# Patient Record
Sex: Female | Born: 1980 | Race: Black or African American | Hispanic: No | Marital: Married | Smoking: Never smoker
Health system: Southern US, Community
[De-identification: ages and names within clinical notes are randomized; demographics above are authoritative.]

## PROBLEM LIST (undated history)

## (undated) ENCOUNTER — Inpatient Hospital Stay (HOSPITAL_COMMUNITY): Payer: Self-pay

## (undated) DIAGNOSIS — O139 Gestational [pregnancy-induced] hypertension without significant proteinuria, unspecified trimester: Secondary | ICD-10-CM

## (undated) DIAGNOSIS — Z8619 Personal history of other infectious and parasitic diseases: Secondary | ICD-10-CM

## (undated) DIAGNOSIS — R51 Headache: Secondary | ICD-10-CM

## (undated) DIAGNOSIS — Z86718 Personal history of other venous thrombosis and embolism: Secondary | ICD-10-CM

## (undated) DIAGNOSIS — K219 Gastro-esophageal reflux disease without esophagitis: Secondary | ICD-10-CM

## (undated) DIAGNOSIS — R519 Headache, unspecified: Secondary | ICD-10-CM

## (undated) DIAGNOSIS — E785 Hyperlipidemia, unspecified: Secondary | ICD-10-CM

## (undated) DIAGNOSIS — O09529 Supervision of elderly multigravida, unspecified trimester: Secondary | ICD-10-CM

## (undated) DIAGNOSIS — I10 Essential (primary) hypertension: Secondary | ICD-10-CM

## (undated) DIAGNOSIS — I2699 Other pulmonary embolism without acute cor pulmonale: Secondary | ICD-10-CM

## (undated) HISTORY — DX: Supervision of elderly multigravida, unspecified trimester: O09.529

## (undated) HISTORY — DX: Gastro-esophageal reflux disease without esophagitis: K21.9

## (undated) HISTORY — DX: Personal history of other infectious and parasitic diseases: Z86.19

## (undated) HISTORY — PX: CHOLECYSTECTOMY: SHX55

## (undated) HISTORY — DX: Headache, unspecified: R51.9

## (undated) HISTORY — DX: Headache: R51

---
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
## 2000-04-19 ENCOUNTER — Encounter: Payer: Self-pay | Admitting: *Deleted

## 2000-04-19 ENCOUNTER — Ambulatory Visit (HOSPITAL_COMMUNITY): Admission: RE | Admit: 2000-04-19 | Discharge: 2000-04-19 | Payer: Self-pay | Admitting: *Deleted

## 2000-05-30 ENCOUNTER — Inpatient Hospital Stay (HOSPITAL_COMMUNITY): Admission: AD | Admit: 2000-05-30 | Discharge: 2000-06-01 | Payer: Self-pay | Admitting: *Deleted

## 2005-10-11 ENCOUNTER — Inpatient Hospital Stay (HOSPITAL_COMMUNITY): Admission: AD | Admit: 2005-10-11 | Discharge: 2005-10-11 | Payer: Self-pay | Admitting: Obstetrics and Gynecology

## 2005-10-12 ENCOUNTER — Inpatient Hospital Stay (HOSPITAL_COMMUNITY): Admission: AD | Admit: 2005-10-12 | Discharge: 2005-10-12 | Payer: Self-pay | Admitting: Obstetrics and Gynecology

## 2005-10-18 ENCOUNTER — Inpatient Hospital Stay (HOSPITAL_COMMUNITY): Admission: AD | Admit: 2005-10-18 | Discharge: 2005-10-18 | Payer: Self-pay | Admitting: Gynecology

## 2005-11-03 ENCOUNTER — Inpatient Hospital Stay (HOSPITAL_COMMUNITY): Admission: AD | Admit: 2005-11-03 | Discharge: 2005-11-03 | Payer: Self-pay | Admitting: Obstetrics and Gynecology

## 2005-11-14 ENCOUNTER — Inpatient Hospital Stay (HOSPITAL_COMMUNITY): Admission: AD | Admit: 2005-11-14 | Discharge: 2005-11-15 | Payer: Self-pay | Admitting: Obstetrics and Gynecology

## 2005-11-30 ENCOUNTER — Inpatient Hospital Stay (HOSPITAL_COMMUNITY): Admission: AD | Admit: 2005-11-30 | Discharge: 2005-12-03 | Payer: Self-pay | Admitting: Obstetrics and Gynecology

## 2007-03-05 ENCOUNTER — Inpatient Hospital Stay (HOSPITAL_COMMUNITY): Admission: AD | Admit: 2007-03-05 | Discharge: 2007-03-06 | Payer: Self-pay | Admitting: Obstetrics & Gynecology

## 2007-03-05 ENCOUNTER — Encounter: Payer: Self-pay | Admitting: Obstetrics & Gynecology

## 2007-03-08 ENCOUNTER — Inpatient Hospital Stay (HOSPITAL_COMMUNITY): Admission: AD | Admit: 2007-03-08 | Discharge: 2007-03-08 | Payer: Self-pay | Admitting: Obstetrics & Gynecology

## 2007-07-03 ENCOUNTER — Emergency Department (HOSPITAL_COMMUNITY): Admission: EM | Admit: 2007-07-03 | Discharge: 2007-07-03 | Payer: Self-pay | Admitting: Emergency Medicine

## 2007-12-04 ENCOUNTER — Ambulatory Visit (HOSPITAL_COMMUNITY): Admission: RE | Admit: 2007-12-04 | Discharge: 2007-12-04 | Payer: Self-pay | Admitting: Obstetrics and Gynecology

## 2008-01-05 ENCOUNTER — Inpatient Hospital Stay (HOSPITAL_COMMUNITY): Admission: AD | Admit: 2008-01-05 | Discharge: 2008-01-07 | Payer: Self-pay | Admitting: Obstetrics and Gynecology

## 2008-03-15 ENCOUNTER — Emergency Department (HOSPITAL_COMMUNITY): Admission: EM | Admit: 2008-03-15 | Discharge: 2008-03-16 | Payer: Self-pay | Admitting: Emergency Medicine

## 2008-03-17 ENCOUNTER — Emergency Department (HOSPITAL_COMMUNITY): Admission: EM | Admit: 2008-03-17 | Discharge: 2008-03-17 | Payer: Self-pay | Admitting: Family Medicine

## 2008-05-20 ENCOUNTER — Emergency Department (HOSPITAL_COMMUNITY): Admission: EM | Admit: 2008-05-20 | Discharge: 2008-05-20 | Payer: Self-pay | Admitting: Family Medicine

## 2008-07-16 ENCOUNTER — Emergency Department (HOSPITAL_COMMUNITY): Admission: EM | Admit: 2008-07-16 | Discharge: 2008-07-17 | Payer: Self-pay | Admitting: Emergency Medicine

## 2008-10-08 ENCOUNTER — Emergency Department (HOSPITAL_COMMUNITY): Admission: EM | Admit: 2008-10-08 | Discharge: 2008-10-08 | Payer: Self-pay | Admitting: Family Medicine

## 2009-01-22 ENCOUNTER — Emergency Department (HOSPITAL_COMMUNITY): Admission: EM | Admit: 2009-01-22 | Discharge: 2009-01-22 | Payer: Self-pay | Admitting: Emergency Medicine

## 2009-05-15 ENCOUNTER — Emergency Department (HOSPITAL_COMMUNITY): Admission: EM | Admit: 2009-05-15 | Discharge: 2009-05-15 | Payer: Self-pay | Admitting: Emergency Medicine

## 2009-06-04 ENCOUNTER — Emergency Department (HOSPITAL_COMMUNITY): Admission: EM | Admit: 2009-06-04 | Discharge: 2009-06-04 | Payer: Self-pay | Admitting: Emergency Medicine

## 2009-07-09 ENCOUNTER — Emergency Department (HOSPITAL_COMMUNITY): Admission: EM | Admit: 2009-07-09 | Discharge: 2009-07-09 | Payer: Self-pay | Admitting: Emergency Medicine

## 2009-11-22 ENCOUNTER — Emergency Department (HOSPITAL_COMMUNITY): Admission: EM | Admit: 2009-11-22 | Discharge: 2009-11-22 | Payer: Self-pay | Admitting: Emergency Medicine

## 2010-05-11 LAB — COMPREHENSIVE METABOLIC PANEL
Albumin: 3.3 g/dL — ABNORMAL LOW (ref 3.5–5.2)
Alkaline Phosphatase: 80 U/L (ref 39–117)
BUN: 5 mg/dL — ABNORMAL LOW (ref 6–23)
CO2: 27 mEq/L (ref 19–32)
Chloride: 107 mEq/L (ref 96–112)
Creatinine, Ser: 0.73 mg/dL (ref 0.4–1.2)
GFR calc non Af Amer: >60 mL/min (ref >60)
Potassium: 4 mEq/L (ref 3.5–5.1)
Total Bilirubin: 0.3 mg/dL (ref 0.3–1.2)

## 2010-05-11 LAB — CBC
HCT: 37.8 % (ref 36.0–46.0)
Hemoglobin: 12.7 g/dL (ref 12.0–15.0)
MCH: 29.6 pg (ref 26.0–34.0)
MCHC: 33.6 g/dL (ref 30.0–36.0)
MCV: 88.1 fL (ref 78.0–100.0)
Platelets: 245 10*3/uL (ref 150–400)
RBC: 4.29 MIL/uL (ref 3.87–5.11)
RDW: 13.7 % (ref 11.5–15.5)
WBC: 7.1 10*3/uL (ref 4.0–10.5)

## 2010-05-11 LAB — DIFFERENTIAL
Basophils Absolute: 0 10*3/uL (ref 0.0–0.1)
Basophils Relative: 1 % (ref 0–1)
Eosinophils Relative: 3 % (ref 0–5)
Lymphocytes Relative: 37 % (ref 12–46)
Monocytes Absolute: 0.6 10*3/uL (ref 0.1–1.0)
Neutro Abs: 3.6 10*3/uL (ref 1.7–7.7)

## 2010-05-11 LAB — URINALYSIS, ROUTINE W REFLEX MICROSCOPIC
Bilirubin Urine: NEGATIVE
Glucose, UA: NEGATIVE mg/dL
Hgb urine dipstick: NEGATIVE
Specific Gravity, Urine: 1.015 (ref 1.005–1.030)
Urobilinogen, UA: 0.2 mg/dL (ref 0.0–1.0)
pH: 6 (ref 5.0–8.0)

## 2010-05-11 LAB — LIPASE, BLOOD: Lipase: 22 U/L (ref 11–59)

## 2010-05-16 LAB — CBC
MCHC: 34.8 g/dL (ref 30.0–36.0)
MCV: 89.8 fL (ref 78.0–100.0)
Platelets: 192 10*3/uL (ref 150–400)
RBC: 3.85 MIL/uL — ABNORMAL LOW (ref 3.87–5.11)
WBC: 7.5 10*3/uL (ref 4.0–10.5)

## 2010-05-16 LAB — COMPREHENSIVE METABOLIC PANEL
ALT: 14 U/L (ref 0–35)
AST: 35 U/L (ref 0–37)
Albumin: 3.3 g/dL — ABNORMAL LOW (ref 3.5–5.2)
Calcium: 8.7 mg/dL (ref 8.4–10.5)
Creatinine, Ser: 0.66 mg/dL (ref 0.4–1.2)
GFR calc Af Amer: >60 mL/min (ref >60)
Sodium: 137 mEq/L (ref 135–145)

## 2010-05-16 LAB — DIFFERENTIAL
Eosinophils Absolute: 0 10*3/uL (ref 0.0–0.7)
Eosinophils Relative: 1 % (ref 0–5)
Lymphocytes Relative: 27 % (ref 12–46)
Lymphs Abs: 2 10*3/uL (ref 0.7–4.0)
Monocytes Absolute: 0.5 10*3/uL (ref 0.1–1.0)
Monocytes Relative: 6 % (ref 3–12)

## 2010-05-17 LAB — DIFFERENTIAL
Basophils Absolute: 0.1 10*3/uL (ref 0.0–0.1)
Basophils Relative: 1 % (ref 0–1)
Eosinophils Absolute: 0.2 10*3/uL (ref 0.0–0.7)
Lymphocytes Relative: 45 % (ref 12–46)
Monocytes Relative: 8 % (ref 3–12)
Neutrophils Relative %: 44 % (ref 43–77)

## 2010-05-17 LAB — COMPREHENSIVE METABOLIC PANEL
Alkaline Phosphatase: 85 U/L (ref 39–117)
BUN: 11 mg/dL (ref 6–23)
Calcium: 8.3 mg/dL — ABNORMAL LOW (ref 8.4–10.5)
Creatinine, Ser: 0.67 mg/dL (ref 0.4–1.2)
Glucose, Bld: 99 mg/dL (ref 70–99)
Potassium: 4.1 mEq/L (ref 3.5–5.1)
Total Protein: 7 g/dL (ref 6.0–8.3)

## 2010-05-17 LAB — CBC
HCT: 36.2 % (ref 36.0–46.0)
Hemoglobin: 11.9 g/dL — ABNORMAL LOW (ref 12.0–15.0)
MCHC: 33 g/dL (ref 30.0–36.0)
MCV: 90.3 fL (ref 78.0–100.0)
Platelets: 199 10*3/uL (ref 150–400)
RDW: 14.5 % (ref 11.5–15.5)

## 2010-05-17 LAB — LIPASE, BLOOD: Lipase: 25 U/L (ref 11–59)

## 2010-05-22 LAB — CBC
Hemoglobin: 12.1 g/dL (ref 12.0–15.0)
Platelets: 253 10*3/uL (ref 150–400)
RDW: 14.7 % (ref 11.5–15.5)
WBC: 8.8 10*3/uL (ref 4.0–10.5)

## 2010-05-22 LAB — COMPREHENSIVE METABOLIC PANEL
ALT: 12 U/L (ref 0–35)
Albumin: 3.3 g/dL — ABNORMAL LOW (ref 3.5–5.2)
Alkaline Phosphatase: 80 U/L (ref 39–117)
Potassium: 3.3 mEq/L — ABNORMAL LOW (ref 3.5–5.1)
Sodium: 136 mEq/L (ref 135–145)
Total Protein: 6.9 g/dL (ref 6.0–8.3)

## 2010-05-22 LAB — DIFFERENTIAL
Basophils Relative: 1 % (ref 0–1)
Eosinophils Absolute: 0.1 10*3/uL (ref 0.0–0.7)
Eosinophils Relative: 2 % (ref 0–5)
Monocytes Absolute: 1 10*3/uL (ref 0.1–1.0)
Monocytes Relative: 11 % (ref 3–12)

## 2010-05-31 LAB — DIFFERENTIAL
Eosinophils Relative: 2 % (ref 0–5)
Lymphocytes Relative: 56 % — ABNORMAL HIGH (ref 12–46)
Lymphs Abs: 5.2 10*3/uL — ABNORMAL HIGH (ref 0.7–4.0)

## 2010-05-31 LAB — URINE MICROSCOPIC-ADD ON

## 2010-05-31 LAB — COMPREHENSIVE METABOLIC PANEL
AST: 27 U/L (ref 0–37)
Albumin: 3.5 g/dL (ref 3.5–5.2)
CO2: 22 mEq/L (ref 19–32)
Calcium: 8.3 mg/dL — ABNORMAL LOW (ref 8.4–10.5)
Creatinine, Ser: 0.62 mg/dL (ref 0.4–1.2)
GFR calc Af Amer: >60 mL/min (ref >60)
GFR calc non Af Amer: >60 mL/min (ref >60)

## 2010-05-31 LAB — URINALYSIS, ROUTINE W REFLEX MICROSCOPIC
Hgb urine dipstick: NEGATIVE
Ketones, ur: NEGATIVE mg/dL
Leukocytes, UA: NEGATIVE
Protein, ur: 100 mg/dL — AB
Urobilinogen, UA: 1 mg/dL (ref 0.0–1.0)

## 2010-05-31 LAB — CBC
MCHC: 33.8 g/dL (ref 30.0–36.0)
MCV: 88.4 fL (ref 78.0–100.0)
Platelets: 235 10*3/uL (ref 150–400)

## 2010-06-12 LAB — COMPREHENSIVE METABOLIC PANEL
ALT: 15 U/L (ref 0–35)
AST: 21 U/L (ref 0–37)
Albumin: 3.4 g/dL — ABNORMAL LOW (ref 3.5–5.2)
Alkaline Phosphatase: 92 U/L (ref 39–117)
Calcium: 9.1 mg/dL (ref 8.4–10.5)
GFR calc Af Amer: >60 mL/min (ref >60)
Glucose, Bld: 94 mg/dL (ref 70–99)
Potassium: 4.1 mEq/L (ref 3.5–5.1)
Sodium: 140 mEq/L (ref 135–145)
Total Protein: 7.1 g/dL (ref 6.0–8.3)

## 2010-06-12 LAB — CBC
Hemoglobin: 12.6 g/dL (ref 12.0–15.0)
MCHC: 33.1 g/dL (ref 30.0–36.0)
Platelets: 263 10*3/uL (ref 150–400)
RDW: 15.2 % (ref 11.5–15.5)

## 2010-06-12 LAB — POCT PREGNANCY, URINE: Preg Test, Ur: NEGATIVE

## 2010-06-12 LAB — URINE MICROSCOPIC-ADD ON

## 2010-06-12 LAB — DIFFERENTIAL
Basophils Relative: 0 % (ref 0–1)
Eosinophils Absolute: 0.1 10*3/uL (ref 0.0–0.7)
Eosinophils Relative: 1 % (ref 0–5)
Lymphs Abs: 2.9 10*3/uL (ref 0.7–4.0)
Monocytes Absolute: 0.7 10*3/uL (ref 0.1–1.0)
Monocytes Relative: 9 % (ref 3–12)

## 2010-06-12 LAB — URINALYSIS, ROUTINE W REFLEX MICROSCOPIC
Bilirubin Urine: NEGATIVE
Nitrite: NEGATIVE
Specific Gravity, Urine: 1.023 (ref 1.005–1.030)
pH: 6.5 (ref 5.0–8.0)

## 2010-07-11 NOTE — Discharge Summary (Signed)
NAME:  Mindy Wade, Mindy Wade NO.:  [id number]   MEDICAL RECORD NO.:  [id number]          PATIENT TYPE:  INP   LOCATION:  9144                          FACILITY:  [facility]   PHYSICIAN:  Huel Cote, M.D. DATE OF BIRTH:  08-03-1980   DATE OF ADMISSION:  01/05/2008  DATE OF DISCHARGE:  01/07/2008                               DISCHARGE SUMMARY   DISCHARGE DIAGNOSES:  1. Term pregnancy at 38 plus weeks delivered.  2. Status post normal spontaneous vaginal delivery.   DISCHARGE MEDICATIONS:  1. Motrin 600 mg p.o. every 6 hours.  2. Percocet 1-2 tablets p.o. every 4 hours p.r.n.   DISCHARGE FOLLOWUP:  The patient is to follow up in the office in 6  weeks for her routine postpartum exam.   HOSPITAL COURSE:  The patient is a 30 year old G4, P 3-0-0-3, who came  in at 68 plus weeks' gestation to maternity admissions in labor with  cervix 4+ centimeters dilated.  She came to L&D and approximately 30  minutes later was found to be 8 cm dilated and received her epidural.  Her prenatal care had been uncomplicated except for a positive anti-e  antibody which had been too weak to titer.  Followup on admission was  actually negative for an anti-e antibody on her blood work here.  She  was also group B Strep positive, but was sensitive to clindamycin.   Prenatal labs were as follows:  A positive antibody negative except for  the anti-e which was too weak to titer during pregnancy, sickle normal,  RPR nonreactive, rubella immune, hepatitis B surface antigen negative,  HIV negative, GC negative, chlamydia negative, group B Strep negative, 1-  hour Glucola 86, and she was too late to have any of the genetic screens  performed.   PAST OBSTETRICAL HISTORY:  In 1999, she had a 5-pound vaginal delivery.  In 2002, she had a 6-pound vaginal delivery.  In 2007, she had an 8  pound 4 ounce vaginal delivery.   PAST GYN HISTORY:  None.   PAST SURGICAL HISTORY:  None.   PAST MEDICAL  HISTORY:  None.   ALLERGIES:  PENICILLIN.   MEDICATIONS:  Prenatal vitamins and Flagyl.   On admission, she was afebrile with stable vital signs.  Fetal heart  rate was reactive.  Cervix was complete, and not very shortly after her  epidural, she had rupture of membranes performed with clear fluid noted.  She had received her clindamycin for her group B Strep prophylaxis  already.  She quickly thereafter reached complete dilation and pushed  great with a normal spontaneous vaginal delivery of a vigorous female  infant over an intact perineum.  Apgars were 9 and 9, weight was 7  pounds even.  Placenta delivered spontaneously.  She was then admitted  for routine  postpartum care.  She did very well.  Her postpartum hemoglobin was  10.1.  By postpartum day #2, she was feeling well.  Her pain was  minimal, and she was felt stable for discharge home.  She was given  instructions on pelvic rest and following up in the office in 6  weeks.      Huel Cote, M.D.  Electronically Signed     KR/MEDQ  D:  01/07/2008  T:  01/07/2008  Job:  [id number]

## 2010-07-14 NOTE — Discharge Summary (Signed)
NAME:  Mindy Wade, Mindy Wade NO.:  [id number]   MEDICAL RECORD NO.:  [id number]          PATIENT TYPE:  INP   LOCATION:  9124                          [facility]ACILITY:  [facility]   PHYSICIAN:  Malachi Pro. Ambrose Mantle, M.D. DATE OF BIRTH:  Jun 22, 1980   DATE OF ADMISSION:  12/02/2005  DATE OF DISCHARGE:  12/03/2005                                 DISCHARGE SUMMARY   A 30 year old black single female, para 2-0-2, gravida 3, EDC 12/08/2005 by  ultrasound, admitted for induction and given advanced cervical dilatation.  Blood group and type A+, negative antibody, sickle cell negative, RPR  nonreactive, rubella immune, hepatitis B surface antigen negative, HIV  negative, GC and chlamydia negative, 1-hour Glucola 126, Group B strep  negative.  Vaginal ultrasound on May 14, 2005, crown-rump length 3.37 cm,  10 weeks 2 days, [facility] December 08, 2005.  The patient did not have second  trimester screening.  Prenatal care was complicated by poor attendance and  multiple no shows.  On October 11, 2005, the cervix was loose fingertip.  Since November 01, 2005, the cervix has been 3-4 cm, and on the exam in the  office on the day of admission the cervix was 4 cm.   PAST MEDICAL HISTORY:  ALLERGIES TO PENICILLIN CAUSE SWELLING.   ILLNESSES:  Usual childhood diseases, Chlamydia treated in 1999, no  operations.  The patient's mother has had high blood pressure and a stroke.   FAMILY HISTORY:  Maternal grandmother with breast cancer.   SOCIAL HISTORY:  Alcohol, tobacco and drugs:  None.   OBSTETRICAL HISTORY:  In November 1999 a 5-pound female, 38 weeks vaginally.  In April of 2002, a 6-pound female, 38 weeks vaginally.   EXAMINATION:  VITAL SIGNS:  On admission were normal.  HEART AND LUNGS:  Normal.  ABDOMEN:  Soft.  The fundal height was 38 cm on November 23, 2005.  Fetal  heart tones were normal.  Cervix 4 cm, 70% vertex and a -2.  The patient was  kept in the maternity admission for a long time.   Finally, she did receive a  bed.  She did have spontaneous rupture of membranes with clear fluid while  in the maternity admission unit.  She was having rare contractions, and the  cervix was 4-5 cm in spite of the fact she had had spontaneous rupture of  membranes.  An amniotomy was done because there was a forebag.  After  amniotomy, the patient immediately developed a strong labor pattern and  progressed to 7 cm dilatation.  She received an epidural, progressed to full  dilatation and slowly brought the vertex to the perineum and delivered  spontaneously LOA over an intact perineum by Dr. Ambrose Mantle a living female infant  8 pounds 4 ounces without Apgars of 9 at 1 and 9 at 5 minutes.  Placenta was  intact.  The uterus was normal.  There were no lacerations.  Blood loss  about 400 cc.  Postpartum, the patient did well and was discharged on the  second postpartum day.  Initial hemoglobin 11.0, hematocrit 31.9, white  count 8,800, platelet count 224,000.  Follow-up hemoglobin 10.7.  Urinalysis  was negative.  RPR nonreactive.   FINAL DIAGNOSIS:  Intrauterine pregnancy at 39 weeks delivered left occiput  anterior.   OPERATION:  Spontaneous delivery left occiput anterior.   FINAL CONDITION:  Improved.   INSTRUCTIONS:  Include our regular discharge instruction booklet, Percocet  5/325 24 tablets 2 every 4-6 hours as needed for pain is given at discharge,  and the patient is advised to return to the office in 6 weeks for follow-up  examination.      Malachi Pro. Ambrose Mantle, M.D.  Electronically Signed     TFH/MEDQ  D:  12/03/2005  T:  12/03/2005  Job:  [id number]

## 2010-09-25 ENCOUNTER — Emergency Department (HOSPITAL_COMMUNITY): Payer: Self-pay

## 2010-09-25 ENCOUNTER — Emergency Department (HOSPITAL_COMMUNITY)
Admission: EM | Admit: 2010-09-25 | Discharge: 2010-09-25 | Disposition: A | Discharge status: Home / Self Care | Payer: Self-pay | Attending: Emergency Medicine | Admitting: Emergency Medicine

## 2010-09-25 DIAGNOSIS — F3289 Other specified depressive episodes: Secondary | ICD-10-CM | POA: Insufficient documentation

## 2010-09-25 DIAGNOSIS — R079 Chest pain, unspecified: Secondary | ICD-10-CM | POA: Insufficient documentation

## 2010-09-25 DIAGNOSIS — F329 Major depressive disorder, single episode, unspecified: Secondary | ICD-10-CM | POA: Insufficient documentation

## 2010-09-25 LAB — DIFFERENTIAL
Basophils Relative: 1 % (ref 0–1)
Eosinophils Absolute: 0.1 10*3/uL (ref 0.0–0.7)
Lymphs Abs: 2.5 10*3/uL (ref 0.7–4.0)
Monocytes Absolute: 0.5 10*3/uL (ref 0.1–1.0)
Monocytes Relative: 9 % (ref 3–12)
Neutrophils Relative %: 45 % (ref 43–77)

## 2010-09-25 LAB — COMPREHENSIVE METABOLIC PANEL
ALT: 14 U/L (ref 0–35)
AST: 17 U/L (ref 0–37)
CO2: 26 mEq/L (ref 19–32)
Chloride: 102 mEq/L (ref 96–112)
GFR calc Af Amer: >60 mL/min (ref >60)
GFR calc non Af Amer: >60 mL/min (ref >60)
Glucose, Bld: 88 mg/dL (ref 70–99)
Sodium: 137 mEq/L (ref 135–145)
Total Bilirubin: 0.1 mg/dL — ABNORMAL LOW (ref 0.3–1.2)

## 2010-09-25 LAB — TROPONIN I: Troponin I: <0.3 ng/mL (ref <0.30)

## 2010-09-25 LAB — CBC
MCH: 30.6 pg (ref 26.0–34.0)
MCHC: 34.4 g/dL (ref 30.0–36.0)
MCV: 89 fL (ref 78.0–100.0)
Platelets: 286 10*3/uL (ref 150–400)
RBC: 4.47 MIL/uL (ref 3.87–5.11)

## 2010-09-25 LAB — CK TOTAL AND CKMB (NOT AT ARMC): Total CK: 132 U/L (ref 7–177)

## 2010-11-15 LAB — WET PREP, GENITAL: Clue Cells Wet Prep HPF POC: NONE SEEN

## 2010-11-15 LAB — HCG, QUANTITATIVE, PREGNANCY: hCG, Beta Chain, Quant, S: 324 — ABNORMAL HIGH

## 2010-11-15 LAB — GC/CHLAMYDIA PROBE AMP, GENITAL
Chlamydia, DNA Probe: NEGATIVE
GC Probe Amp, Genital: NEGATIVE

## 2010-11-28 LAB — TYPE AND SCREEN
ABO/RH(D): A POS
Antibody Screen: POSITIVE
PT AG Type: NEGATIVE

## 2010-11-28 LAB — CBC
Hemoglobin: 10.1 — ABNORMAL LOW
MCHC: 33.6
Platelets: 214
RBC: 3.39 — ABNORMAL LOW
RDW: 14.1
WBC: 7.2
WBC: 8.8

## 2010-11-28 LAB — RPR: RPR Ser Ql: NONREACTIVE

## 2011-05-04 ENCOUNTER — Ambulatory Visit (INDEPENDENT_AMBULATORY_CARE_PROVIDER_SITE_OTHER): Payer: Self-pay | Admitting: General Surgery

## 2011-05-15 ENCOUNTER — Encounter (INDEPENDENT_AMBULATORY_CARE_PROVIDER_SITE_OTHER): Payer: Self-pay | Admitting: General Surgery

## 2012-03-27 ENCOUNTER — Encounter (HOSPITAL_COMMUNITY): Payer: Self-pay | Admitting: *Deleted

## 2012-03-27 ENCOUNTER — Inpatient Hospital Stay (HOSPITAL_COMMUNITY): Payer: Self-pay

## 2012-03-27 ENCOUNTER — Inpatient Hospital Stay (HOSPITAL_COMMUNITY)
Admission: AD | Admit: 2012-03-27 | Discharge: 2012-03-27 | Disposition: A | Discharge status: Home / Self Care | Payer: Self-pay | Source: Ambulatory Visit | Attending: Obstetrics and Gynecology | Admitting: Obstetrics and Gynecology

## 2012-03-27 DIAGNOSIS — O200 Threatened abortion: Secondary | ICD-10-CM

## 2012-03-27 DIAGNOSIS — R109 Unspecified abdominal pain: Secondary | ICD-10-CM | POA: Insufficient documentation

## 2012-03-27 LAB — CBC
HCT: 37.3 % (ref 36.0–46.0)
Hemoglobin: 12.4 g/dL (ref 12.0–15.0)
MCV: 90.5 fL (ref 78.0–100.0)
RDW: 13.7 % (ref 11.5–15.5)
WBC: 7 10*3/uL (ref 4.0–10.5)

## 2012-03-27 LAB — WET PREP, GENITAL
Clue Cells Wet Prep HPF POC: NONE SEEN
Trich, Wet Prep: NONE SEEN
Yeast Wet Prep HPF POC: NONE SEEN

## 2012-03-27 NOTE — MAU Note (Signed)
HAD HIGH BP WITH LAST BABY- HAS H/A NOW- STARTED AT  630PM.

## 2012-03-27 NOTE — MAU Provider Note (Signed)
History     CSN: [id number]  Arrival date and time: 03/27/12 2005   None     Chief Complaint  Patient presents with  . Abdominal Cramping  . Vaginal Bleeding   HPI  Pt is G6P4 ?weeks pregnant  With [facility] U[facility] at [facility].  Pt's LMP was 12/17/2011.  Pt complains of cramping like menstrual period and started spotting this morning.   Pt had IC last night. Pt denies UTI symptoms, fever, chills, constipation or diarrhea.    No past medical history on file.  No past surgical history on file.  No family history on file.  History  Substance Use Topics  . Smoking status: Not on file  . Smokeless tobacco: Not on file  . Alcohol Use: Not on file    Allergies: Allergies not on file  No prescriptions prior to admission    ROS Physical Exam   Blood pressure 138/96, pulse 90, temperature 98.9 F (37.2 C), temperature source Oral, height 5\' 8"  (1.727 m), weight 253 lb 2 oz (114.817 kg), last menstrual period 12/17/2011.  Physical Exam  Nursing note and vitals reviewed. Constitutional: She is oriented to person, place, and time. She appears well-developed and well-nourished.  HENT:  Head: Normocephalic.  Eyes: Pupils are equal, round, and reactive to light.  Neck: Normal range of motion. Neck supple.  Cardiovascular: Normal rate.   Respiratory: Effort normal.  GI: Soft.  Genitourinary:       Small amount of dark brown blood invault; cervix closed, NT ; uterus NSSC NT without adnexal tenderness or palpable enlargement  Musculoskeletal: Normal range of motion.  Neurological: She is alert and oriented to person, place, and time.  Skin: Skin is warm and dry.  Psychiatric: She has a normal mood and affect.    MAU Course  Procedures Pt is A pos from review of records Clinical Data: 32 year old pregnant female with pelvic pain and  spotting. Estimated gestational age of [redacted] weeks 3 days by LMP.  OBSTETRIC <14 WK [place] AND TRANSVAGINAL OB US  Technique: Both  transabdominal and transvaginal ultrasound  examinations were performed for complete evaluation of the  gestation as well as the maternal uterus, adnexal regions, and  pelvic cul-de-sac. Transvaginal technique was performed to assess  early pregnancy.  Comparison: None  Intrauterine gestational sac: Visualized but slightly irregular  and with poor decidual reaction.  Yolk sac: Present  Embryo: Present  Cardiac Activity: Not visualized  CRL: 3.6 mm 6 w 1 d [place] EDC: 11/19/2012  Maternal uterus/adnexae:  There is no evidence of subchorionic hemorrhage.  The ovaries bilaterally are unremarkable.  There is no evidence of free fluid or adnexal mass.  IMPRESSION:  Single intrauterine pregnancy with estimated gestational age of [redacted]  weeks 1 day by crown-rump length. However, slightly irregular  intrauterine gestational sac with poor decidual reaction and no  fetal cardiac activity identified. Findings are suspicious but not  yet definitive for failed pregnancy. Recommend follow-up US in 10-  14 days for definitive diagnosis. This recommendation follows SRU  consensus guidelines: Diagnostic Criteria for Nonviable Pregnancy  Early in the First Trimester. Malva Limes Med 2013; 295:2841-32.  Original Report Authenticated By: Harmon Pier, M.D. Results for orders placed during the hospital encounter of 03/27/12 (from the past 24 hour(s))  CBC     Status: Normal   Collection Time   03/27/12  8:55 PM      Component Value Range   WBC 7.0  4.0 - 10.5 K/uL  RBC 4.12  3.87 - 5.11 MIL/uL   Hemoglobin 12.4  12.0 - 15.0 g/dL   HCT 21.3  08.6 - 57.8 %   MCV 90.5  78.0 - 100.0 fL   MCH 30.1  26.0 - 34.0 pg   MCHC 33.2  30.0 - 36.0 g/dL   RDW 46.9  62.9 - 52.8 %   Platelets 274  150 - 400 K/uL  HCG, QUANTITATIVE, PREGNANCY     Status: Abnormal   Collection Time   03/27/12  8:55 PM      Component Value Range   hCG, Beta Chain, Quant, S 1856 (*) <5 mIU/mL   Dr. Senaida Ores here Discussed with pt and  partner that this is probably failed miscarriage- will confirm with repeat HCG Assessment and Plan  Threatened miscarriage F/u Sunday Feb 2 for repeat [facility]  Makaylia Hewett 03/27/2012, 8:52 PM

## 2012-03-27 NOTE — MAU Note (Signed)
PT  HAS NOT BEEN SEEN BY DR HNELEY--  APPOINTMENT  IS 2-20.  HAD  [facility] D[facility]-  WAS P[OSITIVE.   SAYS SHE STARTED  CRAMPING  ON Tuesday- THEN STARTED   SPOTTING THIS AM- NO PAD ON-- ONLY WHEN SHE WIPES.LAST SE X- LAST NIGHT.

## 2012-03-27 NOTE — MAU Note (Signed)
NO MEDS FOR CRAMPING

## 2012-03-27 NOTE — MAU Note (Signed)
"  I started cramping about 2 days and I started spotting early this morning.  The spotting has stopped now.  The pain is more like menstrual cramps.  When it first started it was a little stronger than menstrual cramps."

## 2012-03-28 LAB — GC/CHLAMYDIA PROBE AMP
CT Probe RNA: NEGATIVE
GC Probe RNA: NEGATIVE

## 2012-03-30 ENCOUNTER — Inpatient Hospital Stay (HOSPITAL_COMMUNITY)
Admission: AD | Admit: 2012-03-30 | Discharge: 2012-03-30 | Disposition: A | Discharge status: Home / Self Care | Payer: Self-pay | Source: Ambulatory Visit | Attending: Obstetrics and Gynecology | Admitting: Obstetrics and Gynecology

## 2012-03-30 DIAGNOSIS — O039 Complete or unspecified spontaneous abortion without complication: Secondary | ICD-10-CM

## 2012-03-30 NOTE — MAU Provider Note (Signed)
32 y.o. G6P4 at approx [redacted] weeks EGA here for repeat quant. Pt seen on 1/30 with cramping and spotting, HCG was 1856 and u/s showed irregular IUGS, not definitive for failed IUP at that time. Today bleeding has increased somewhat, changing pad 2-3 times/day. No pain.  O: VSS, Gen: well appearing, no distress  Results for orders placed during the hospital encounter of 03/30/12 (from the past 24 hour(s))  HCG, QUANTITATIVE, PREGNANCY     Status: Abnormal   Collection Time   03/30/12 10:28 AM      Component Value Range   hCG, Beta Chain, Quant, S 1404 (*) <5 mIU/mL   A/P: 1. Miscarriage   Repeat quant in 1 week. Rev'd precautions.     Medication List    Notice       You have not been prescribed any medications.             Follow-up Information    Follow up with [facility]. In 1 week. (for repeat labs)    Contact information:   [address] [id number] mc [place] [address] [phone]

## 2012-03-30 NOTE — MAU Note (Signed)
MS [place] is here for a follow up beta hcg. This is her second, last was done on Jan 30. She says her pain is 0, however her bleeding seems heavier than a few days ago. She is changing a pad twice a day.

## 2012-04-30 ENCOUNTER — Inpatient Hospital Stay (HOSPITAL_COMMUNITY)
Admission: AD | Admit: 2012-04-30 | Discharge: 2012-04-30 | Disposition: A | Discharge status: Home / Self Care | Payer: Self-pay | Source: Ambulatory Visit | Attending: Obstetrics and Gynecology | Admitting: Obstetrics and Gynecology

## 2012-04-30 ENCOUNTER — Encounter (HOSPITAL_COMMUNITY): Payer: Self-pay | Admitting: *Deleted

## 2012-04-30 DIAGNOSIS — N390 Urinary tract infection, site not specified: Secondary | ICD-10-CM | POA: Insufficient documentation

## 2012-04-30 DIAGNOSIS — R300 Dysuria: Secondary | ICD-10-CM | POA: Insufficient documentation

## 2012-04-30 LAB — URINALYSIS, ROUTINE W REFLEX MICROSCOPIC
Bilirubin Urine: NEGATIVE
Glucose, UA: NEGATIVE mg/dL
Ketones, ur: NEGATIVE mg/dL
Protein, ur: NEGATIVE mg/dL
pH: 6 (ref 5.0–8.0)

## 2012-04-30 LAB — URINE MICROSCOPIC-ADD ON

## 2012-04-30 MED ORDER — PHENAZOPYRIDINE HCL 100 MG PO TABS
200.0000 mg | ORAL_TABLET | Freq: Once | ORAL | Status: AC
  Administered 2012-04-30: 200 mg via ORAL
  Filled 2012-04-30: qty 2

## 2012-04-30 MED ORDER — PHENAZOPYRIDINE HCL 95 MG PO TABS: 95.0000 mg | ORAL_TABLET | Freq: Three times a day (TID) | ORAL | Status: DC | PRN

## 2012-04-30 MED ORDER — CIPROFLOXACIN HCL 500 MG PO TABS
500.0000 mg | ORAL_TABLET | Freq: Once | ORAL | Status: AC
  Administered 2012-04-30: 500 mg via ORAL
  Filled 2012-04-30: qty 1

## 2012-04-30 MED ORDER — CIPROFLOXACIN HCL 500 MG PO TABS: 500.0000 mg | ORAL_TABLET | Freq: Two times a day (BID) | ORAL | Status: DC

## 2012-04-30 NOTE — MAU Note (Signed)
Pt reports she has been going to the bathroom frequently and still feels like she has to urinate when she is finished. Some discomfort with urination. Symptoms today

## 2012-04-30 NOTE — MAU Provider Note (Signed)
History     CSN: [id number]  Arrival date and time: 04/30/12 2125   None     Chief Complaint  Patient presents with  . Urinary Frequency   HPI  Mindy Wade is a 32 y.o. G31P4  Who presents today with dysuria that has progressively gotten worse throughout the day. It started this morning with pressure upon voiding and has now become a burning sensation with voiding. She denies any vaginal discharge, odor or irritation.   Past Medical History  Diagnosis Date  . Medical history non-contributory     Past Surgical History  Procedure Laterality Date  . No past surgeries      History reviewed. No pertinent family history.  History  Substance Use Topics  . Smoking status: Never Smoker   . Smokeless tobacco: Not on file  . Alcohol Use: No    Allergies:  Allergies  Allergen Reactions  . Penicillins Anaphylaxis    Prescriptions prior to admission  Medication Sig Dispense Refill  . ibuprofen (ADVIL,MOTRIN) 200 MG tablet Take 200 mg by mouth every 6 (six) hours as needed for pain. headache      . Multiple Vitamin (MULTIVITAMIN) tablet Take 1 tablet by mouth daily.        Review of Systems  Constitutional: Negative for fever.  Gastrointestinal: Negative for nausea, vomiting, diarrhea and constipation.  Genitourinary: Positive for dysuria, urgency and frequency.  Musculoskeletal: Negative for myalgias.   Physical Exam   Blood pressure 142/97, pulse 100, temperature 98.7 F (37.1 C), temperature source Oral, resp. rate 20, height 5\' 8"  (1.727 m), weight 262 lb (118.842 kg), last menstrual period 12/17/2011, SpO2 100.00%, unknown if currently breastfeeding.  Physical Exam  Nursing note and vitals reviewed. Constitutional: She is oriented to person, place, and time. She appears well-developed and well-nourished. No distress.  Cardiovascular: Normal rate.   Respiratory: Effort normal.  GI: Soft.  Neurological: She is alert and oriented to person, place, and time.   Skin: Skin is warm and dry.  Psychiatric: She has a normal mood and affect.    MAU Course  Procedures  Results for orders placed during the hospital encounter of 04/30/12 (from the past 24 hour(s))  URINALYSIS, ROUTINE W REFLEX MICROSCOPIC     Status: Abnormal   Collection Time    04/30/12  9:56 PM      Result Value Range   Color, Urine YELLOW  YELLOW   APPearance CLEAR  CLEAR   Specific Gravity, Urine 1.025  1.005 - 1.030   pH 6.0  5.0 - 8.0   Glucose, UA NEGATIVE  NEGATIVE mg/dL   Hgb urine dipstick SMALL (*) NEGATIVE   Bilirubin Urine NEGATIVE  NEGATIVE   Ketones, ur NEGATIVE  NEGATIVE mg/dL   Protein, ur NEGATIVE  NEGATIVE mg/dL   Urobilinogen, UA 0.2  0.0 - 1.0 mg/dL   Nitrite NEGATIVE  NEGATIVE   Leukocytes, UA SMALL (*) NEGATIVE  URINE MICROSCOPIC-ADD ON     Status: Abnormal   Collection Time    04/30/12  9:56 PM      Result Value Range   Squamous Epithelial / LPF FEW (*) RARE   WBC, UA 21-50  <3 WBC/hpf   RBC / HPF 21-50  <3 RBC/hpf   Bacteria, UA MANY (*) RARE     Assessment and Plan   1. UTI (urinary tract infection)   RX: Cipro 500mg  BID X5 Pyridium 100mg  TID   FU with PCP as needed.    Tawnya Crook  04/30/2012, 10:32 PM

## 2012-05-01 NOTE — MAU Provider Note (Signed)
Attestation of Attending Supervision of Advanced Practitioner: Evaluation and management procedures were performed by the PA/NP/CNM/OB Fellow under my supervision/collaboration. Chart reviewed and agree with management and plan.  FERGUSON,JOHN V 05/01/2012 3:36 AM

## 2012-05-02 LAB — URINE CULTURE: Colony Count: >=100000

## 2012-09-02 LAB — OB RESULTS CONSOLE RPR: RPR: NONREACTIVE

## 2012-09-02 LAB — OB RESULTS CONSOLE ABO/RH: RH Type: POSITIVE

## 2012-09-02 LAB — OB RESULTS CONSOLE HIV ANTIBODY (ROUTINE TESTING): HIV: NONREACTIVE

## 2012-09-02 LAB — OB RESULTS CONSOLE HEPATITIS B SURFACE ANTIGEN: Hepatitis B Surface Ag: NEGATIVE

## 2012-09-02 LAB — OB RESULTS CONSOLE GC/CHLAMYDIA: Chlamydia: NEGATIVE

## 2013-02-24 ENCOUNTER — Encounter (HOSPITAL_COMMUNITY): Payer: Self-pay | Admitting: *Deleted

## 2013-02-24 ENCOUNTER — Telehealth (HOSPITAL_COMMUNITY): Payer: Self-pay | Admitting: *Deleted

## 2013-02-24 NOTE — Telephone Encounter (Signed)
Preadmission screen  

## 2013-02-26 NOTE — L&D Delivery Note (Signed)
Delivery Note When I arrived in the LDR, the amniotic membranes were protruding through the vagina.  We ruptured membranes with clear fluid noted.  The patient pushed and at 12:17 PM a viable female appearing fetus  was delivered via Vaginal, Spontaneous Delivery, vertex).  APGAR: 2 ,1 ; weight .   Placenta status: Intact, Spontaneous. The baby appeared normal on gross inspection and female.  There was no evidence of infection.  No obvious cause for her preterm delivery.  The baby's eyes were fused, so NICU was not called.  The placenta delivered spontaneously with massage and gentle traction and appeared intact.  I discussed with the patient unclear etiology of early delivery.  We will send placenta to pathology and check cultures for infection.  Anesthesia: None  Episiotomy: None Lacerations: None Suture Repair:n/a Est. Blood Loss (mL): 300cc  Mom to third floor.  [facility]  [name],Orry Sigl W 10/09/2013, 1:02 PM

## 2013-03-01 ENCOUNTER — Other Ambulatory Visit: Payer: Self-pay | Admitting: Obstetrics and Gynecology

## 2013-03-02 ENCOUNTER — Inpatient Hospital Stay (HOSPITAL_COMMUNITY)
Admission: RE | Admit: 2013-03-02 | Discharge: 2013-03-04 | DRG: 775 | Disposition: A | Discharge status: Home / Self Care | Payer: 59 | Source: Ambulatory Visit | Attending: Obstetrics and Gynecology | Admitting: Obstetrics and Gynecology

## 2013-03-02 ENCOUNTER — Encounter (HOSPITAL_COMMUNITY): Payer: 59 | Admitting: Anesthesiology

## 2013-03-02 ENCOUNTER — Encounter (HOSPITAL_COMMUNITY): Payer: Self-pay

## 2013-03-02 ENCOUNTER — Inpatient Hospital Stay (HOSPITAL_COMMUNITY): Payer: 59 | Admitting: Anesthesiology

## 2013-03-02 VITALS — BP 137/97 | HR 69 | Temp 98.4°F | Resp 20 | Ht 68.0 in | Wt 282.0 lb

## 2013-03-02 DIAGNOSIS — O99892 Other specified diseases and conditions complicating childbirth: Principal | ICD-10-CM | POA: Diagnosis present

## 2013-03-02 DIAGNOSIS — Z22330 Carrier of Group B streptococcus: Secondary | ICD-10-CM

## 2013-03-02 DIAGNOSIS — Z3490 Encounter for supervision of normal pregnancy, unspecified, unspecified trimester: Secondary | ICD-10-CM

## 2013-03-02 DIAGNOSIS — D649 Anemia, unspecified: Secondary | ICD-10-CM | POA: Diagnosis not present

## 2013-03-02 DIAGNOSIS — O9989 Other specified diseases and conditions complicating pregnancy, childbirth and the puerperium: Principal | ICD-10-CM

## 2013-03-02 DIAGNOSIS — O9903 Anemia complicating the puerperium: Secondary | ICD-10-CM | POA: Diagnosis not present

## 2013-03-02 DIAGNOSIS — Z880 Allergy status to penicillin: Secondary | ICD-10-CM

## 2013-03-02 LAB — CBC
HCT: 31.4 % — ABNORMAL LOW (ref 36.0–46.0)
HCT: 34.2 % — ABNORMAL LOW (ref 36.0–46.0)
HEMOGLOBIN: 10.6 g/dL — AB (ref 12.0–15.0)
Hemoglobin: 11.5 g/dL — ABNORMAL LOW (ref 12.0–15.0)
MCH: 28 pg (ref 26.0–34.0)
MCH: 27.9 pg (ref 26.0–34.0)
MCHC: 33.8 g/dL (ref 30.0–36.0)
MCHC: 33.6 g/dL (ref 30.0–36.0)
MCV: 82.8 fL (ref 78.0–100.0)
MCV: 83 fL (ref 78.0–100.0)
Platelets: 230 10*3/uL (ref 150–400)
Platelets: 211 10*3/uL (ref 150–400)
RBC: 3.79 MIL/uL — ABNORMAL LOW (ref 3.87–5.11)
RBC: 4.12 MIL/uL (ref 3.87–5.11)
RDW: 14.8 % (ref 11.5–15.5)
RDW: 14.8 % (ref 11.5–15.5)
WBC: 9 10*3/uL (ref 4.0–10.5)
WBC: 7.7 10*3/uL (ref 4.0–10.5)

## 2013-03-02 LAB — RPR: RPR: NONREACTIVE

## 2013-03-02 MED ORDER — OXYCODONE-ACETAMINOPHEN 5-325 MG PO TABS: 1.0000 | ORAL_TABLET | ORAL | Status: DC | PRN

## 2013-03-02 MED ORDER — EPHEDRINE 5 MG/ML INJ
10.0000 mg | INTRAVENOUS | Status: DC | PRN
  Filled 2013-03-02: qty 4

## 2013-03-02 MED ORDER — TETANUS-DIPHTH-ACELL PERTUSSIS 5-2.5-18.5 LF-MCG/0.5 IM SUSP
0.5000 mL | Freq: Once | INTRAMUSCULAR | Status: DC
  Filled 2013-03-02: qty 0.5

## 2013-03-02 MED ORDER — MEASLES, MUMPS & RUBELLA VAC ~~LOC~~ INJ
0.5000 mL | INJECTION | Freq: Once | SUBCUTANEOUS | Status: DC
Start: 2013-03-03 — End: 2013-03-03
  Filled 2013-03-02: qty 0.5

## 2013-03-02 MED ORDER — PHENYLEPHRINE 40 MCG/ML (10ML) SYRINGE FOR IV PUSH (FOR BLOOD PRESSURE SUPPORT): 80.0000 ug | PREFILLED_SYRINGE | INTRAVENOUS | Status: DC | PRN

## 2013-03-02 MED ORDER — LIDOCAINE HCL (PF) 1 % IJ SOLN
30.0000 mL | INTRAMUSCULAR | Status: DC | PRN
  Filled 2013-03-02: qty 30

## 2013-03-02 MED ORDER — DIPHENHYDRAMINE HCL 50 MG/ML IJ SOLN: 12.5000 mg | INTRAMUSCULAR | Status: DC | PRN

## 2013-03-02 MED ORDER — LIDOCAINE HCL (PF) 1 % IJ SOLN
INTRAMUSCULAR | Status: DC | PRN
  Administered 2013-03-02 (×2): 5 mL

## 2013-03-02 MED ORDER — LACTATED RINGERS IV SOLN: INTRAVENOUS | Status: AC

## 2013-03-02 MED ORDER — ZOLPIDEM TARTRATE 5 MG PO TABS: 5.0000 mg | ORAL_TABLET | Freq: Every evening | ORAL | Status: DC | PRN

## 2013-03-02 MED ORDER — EPHEDRINE 5 MG/ML INJ: 10.0000 mg | INTRAVENOUS | Status: DC | PRN

## 2013-03-02 MED ORDER — FENTANYL 2.5 MCG/ML BUPIVACAINE 1/10 % EPIDURAL INFUSION (WH - ANES)
14.0000 mL/h | INTRAMUSCULAR | Status: DC | PRN
  Administered 2013-03-02: 14 mL/h via EPIDURAL
  Filled 2013-03-02: qty 125

## 2013-03-02 MED ORDER — DIPHENHYDRAMINE HCL 25 MG PO CAPS: 25.0000 mg | ORAL_CAPSULE | Freq: Four times a day (QID) | ORAL | Status: DC | PRN

## 2013-03-02 MED ORDER — OXYTOCIN BOLUS FROM INFUSION: 500.0000 mL | INTRAVENOUS | Status: DC

## 2013-03-02 MED ORDER — ONDANSETRON HCL 4 MG/2ML IJ SOLN: 4.0000 mg | INTRAMUSCULAR | Status: DC | PRN

## 2013-03-02 MED ORDER — LACTATED RINGERS IV SOLN: 500.0000 mL | INTRAVENOUS | Status: DC | PRN

## 2013-03-02 MED ORDER — SENNOSIDES-DOCUSATE SODIUM 8.6-50 MG PO TABS
2.0000 | ORAL_TABLET | ORAL | Status: DC
  Administered 2013-03-02 – 2013-03-03 (×2): 2 via ORAL
  Filled 2013-03-02 (×2): qty 2

## 2013-03-02 MED ORDER — OXYCODONE-ACETAMINOPHEN 5-325 MG PO TABS
1.0000 | ORAL_TABLET | ORAL | Status: DC | PRN
Start: 2013-03-02 — End: 2013-03-04
  Administered 2013-03-02 – 2013-03-03 (×2): 1 via ORAL
  Filled 2013-03-02 (×2): qty 1

## 2013-03-02 MED ORDER — SIMETHICONE 80 MG PO CHEW: 80.0000 mg | CHEWABLE_TABLET | ORAL | Status: DC | PRN

## 2013-03-02 MED ORDER — LANOLIN HYDROUS EX OINT: TOPICAL_OINTMENT | CUTANEOUS | Status: DC | PRN

## 2013-03-02 MED ORDER — LACTATED RINGERS IV SOLN: 500.0000 mL | Freq: Once | INTRAVENOUS | Status: DC

## 2013-03-02 MED ORDER — PRENATAL MULTIVITAMIN CH
1.0000 | ORAL_TABLET | Freq: Every day | ORAL | Status: DC
  Administered 2013-03-03: 1 via ORAL
  Filled 2013-03-02 (×2): qty 1

## 2013-03-02 MED ORDER — WITCH HAZEL-GLYCERIN EX PADS: 1.0000 "application " | MEDICATED_PAD | CUTANEOUS | Status: DC | PRN

## 2013-03-02 MED ORDER — ONDANSETRON HCL 4 MG PO TABS: 4.0000 mg | ORAL_TABLET | ORAL | Status: DC | PRN

## 2013-03-02 MED ORDER — IBUPROFEN 600 MG PO TABS: 600.0000 mg | ORAL_TABLET | Freq: Four times a day (QID) | ORAL | Status: DC | PRN

## 2013-03-02 MED ORDER — IBUPROFEN 600 MG PO TABS
600.0000 mg | ORAL_TABLET | Freq: Four times a day (QID) | ORAL | Status: DC
  Administered 2013-03-02 – 2013-03-04 (×7): 600 mg via ORAL
  Filled 2013-03-02 (×8): qty 1

## 2013-03-02 MED ORDER — BENZOCAINE-MENTHOL 20-0.5 % EX AERO
1.0000 "application " | INHALATION_SPRAY | CUTANEOUS | Status: DC | PRN
  Administered 2013-03-02: 1 via TOPICAL
  Filled 2013-03-02 (×2): qty 56

## 2013-03-02 MED ORDER — PHENYLEPHRINE 40 MCG/ML (10ML) SYRINGE FOR IV PUSH (FOR BLOOD PRESSURE SUPPORT)
80.0000 ug | PREFILLED_SYRINGE | INTRAVENOUS | Status: DC | PRN
Start: 2013-03-02 — End: 2013-03-02
  Filled 2013-03-02: qty 10

## 2013-03-02 MED ORDER — DIBUCAINE 1 % RE OINT
1.0000 "application " | TOPICAL_OINTMENT | RECTAL | Status: DC | PRN
  Filled 2013-03-02: qty 28

## 2013-03-02 MED ORDER — PRENATAL MULTIVITAMIN CH: 1.0000 | ORAL_TABLET | Freq: Every day | ORAL | Status: DC

## 2013-03-02 MED ORDER — VANCOMYCIN HCL IN DEXTROSE 1-5 GM/200ML-% IV SOLN
1000.0000 mg | Freq: Two times a day (BID) | INTRAVENOUS | Status: DC
  Administered 2013-03-02: 1000 mg via INTRAVENOUS
  Filled 2013-03-02 (×2): qty 200

## 2013-03-02 MED ORDER — OXYTOCIN 40 UNITS IN LACTATED RINGERS INFUSION - SIMPLE MED: 62.5000 mL/h | INTRAVENOUS | Status: DC

## 2013-03-02 MED ORDER — OXYTOCIN 40 UNITS IN LACTATED RINGERS INFUSION - SIMPLE MED
1.0000 m[IU]/min | INTRAVENOUS | Status: DC
  Administered 2013-03-02: 1 m[IU]/min via INTRAVENOUS
  Filled 2013-03-02: qty 1000

## 2013-03-02 MED ORDER — OXYTOCIN 40 UNITS IN LACTATED RINGERS INFUSION - SIMPLE MED
62.5000 mL/h | INTRAVENOUS | Status: AC | PRN
Start: 2013-03-02 — End: 2013-03-03

## 2013-03-02 MED ORDER — LACTATED RINGERS IV SOLN
INTRAVENOUS | Status: DC
  Administered 2013-03-02: 08:00:00 via INTRAVENOUS

## 2013-03-02 NOTE — Progress Notes (Signed)
Patient ID: Mindy Wade, female   DOB: 1980/05/03, 33 y.o.   MRN: [id number] Delivery note:   i FOUND THE PT TO BE FULLY DILATED AT CLOSE TO 1:50 PM. She was prepared for delivery in the stirrups and with 2 contractions she was able to deliver a healthy female infant ROA over an intact perineum. Apgars were 9 and 9 at 1 and 5 minutes. The placenta was removed intact and the uterus was normal. The uterus contracted well and there baby was left on the maternal abdomen. EBL 400 cc's.

## 2013-03-02 NOTE — Anesthesia Procedure Notes (Signed)
Epidural Patient location during procedure: OB Start time: 03/02/2013 12:08 PM  Staffing Anesthesiologist: Brayton CavesJACKSON, Lamees Gable Performed by: anesthesiologist   Preanesthetic Checklist Completed: patient identified, site marked, surgical consent, pre-op evaluation, timeout performed, IV checked, risks and benefits discussed and monitors and equipment checked  Epidural Patient position: sitting Prep: site prepped and draped and DuraPrep Patient monitoring: continuous pulse ox and blood pressure Approach: midline Injection technique: LOR air  Needle:  Needle type: Tuohy  Needle gauge: 17 G Needle length: 9 cm and 9 Needle insertion depth: 8 cm Catheter type: closed end flexible Catheter size: 19 Gauge Catheter at skin depth: 13 cm Test dose: negative  Assessment Events: blood not aspirated, injection not painful, no injection resistance, negative IV test and no paresthesia  Additional Notes Patient identified.  Risk benefits discussed including failed block, incomplete pain control, headache, nerve damage, paralysis, blood pressure changes, nausea, vomiting, reactions to medication both toxic or allergic, and postpartum back pain.  Patient expressed understanding and wished to proceed.  All questions were answered.  Sterile technique used throughout procedure and epidural site dressed with sterile barrier dressing. No paresthesia or other complications noted.The patient did not experience any signs of intravascular injection such as tinnitus or metallic taste in mouth nor signs of intrathecal spread such as rapid motor block. Please see nursing notes for vital signs.

## 2013-03-02 NOTE — H&P (Signed)
NAME:  [place], Quantisha                 ACCOUNT NO.:  MEDICAL RECORD NO.:  [id number]  LOCATION:                                 FACILITY:  PHYSICIAN:  Malachi Pro. Ambrose Mantle, M.D. DATE OF BIRTH:  [birth year]  DATE OF ADMISSION: DATE OF DISCHARGE:                             HISTORY & PHYSICAL   PRESENT ILLNESS:  This is a 33 year old black female, para 4, 0-0-4, gravida 5, EDC March 05, 2013, admitted for induction of labor.  Blood group and type A positive.  Positive antibody, anti E Coombs titer was 16.  The father of the baby was checked and he is E negative.  Rubella immune, RPR nonreactive.  Urine culture negative.  Hepatitis B surface antigen negative, HIV negative, GC and Chlamydia negative.  Cystic fibrosis negative.  First trimester screen negative.  AFP negative.  One- hour Glucola 97 on October 13; 86 on December 10.  Repeat HIV and RPR nonreactive.  Group B strep positive, resistant to clindamycin.  The patient began her prenatal course in our office at 13 weeks and 5 days. Her initial blood pressure was 150/90.  The patient had a blood pressure 148/98 at 35 weeks and 6 days, and we did PIH labs including a 24-hour urine protein.  The 24-hour urine protein was not enough for preeclampsia.  The blood labs were normal.  She was anemic and was advised to take ferrous sulfate.  Her blood pressure has remained slightly elevated and she has undergone twice weekly nonstress tests, and they were reactive.  At her last prenatal visit on February 27, 2013, her cervix was 3 cm dilated, 50% effaced.  PAST MEDICAL HISTORY:  Reveals high blood pressure.  SOCIAL HISTORY:  She never smoked.  Does not drink.  Denies illicit drugs.  Twelfth grade education.  Works at [employer] in [employer].  She is separated.  Father of the baby is Sharlyne Pacas.  ALLERGIES:  She has an allergy to penicillin that caused respiratory distress, swelling at age 34.  She has no latex allergy and no food allergy.  PAST  SURGICAL HISTORY:  She has no surgical history.  FAMILY HISTORY:  Maternal grandmother, cancer of the breast at age 65 and died at age 13.  Mother of COPD, CVA, died at age 38, had high blood pressure.  Maternal aunt with diabetes.  Father with malignant tumor of the prostate.  PHYSICAL EXAM:  VITAL SIGNS:  At her last prenatal visit, her blood pressure was 138/88, pulse was 80. HEART:  Normal size and sounds.  No murmurs. LUNGS:  Clear to auscultation. Fundal height was 41 cm, cervix 3 cm, 50%, vertex at a -3.  OBSTETRIC HISTORY:  From 1999 to 2009, she had 4 vaginal deliveries. The largest was 5 pounds 14 ounces.  Heaviest was 8 pounds 4 ounces. First 2 were delivered by Dr. Jennette Kettle.  The third and fourth were delivered by me and Dr. Senaida Ores respectively.  ADMITTING IMPRESSION:  Intrauterine pregnancy at 39 weeks and 4 days, positive group B strep, allergy to penicillin and resistance of group B strep to clindamycin.  She will need to be treated with vancomycin in labor.  She is admitted  for induction of labor.     Malachi Prohomas F. Ambrose MantleHenley, M.D.     TFH/MEDQ  D:  03/01/2013  T:  03/01/2013  Job:  [id number]

## 2013-03-02 NOTE — Progress Notes (Signed)
Patient ID: Mindy Wade, female   DOB: 1980/08/31, 33 y.o.   MRN: [id number] The pt requested and received an epidural. The pitocin is at 8 mu/ minute and the contractions are not printing out on the monitor.The cervix is 4 cm 50 % effaced and the vertex is at - 3 station. AROM attempted but no fluid seen Will increase the pitocin

## 2013-03-02 NOTE — Anesthesia Preprocedure Evaluation (Signed)
Anesthesia Evaluation  Patient identified by MRN, date of birth, ID band Patient awake    Reviewed: Allergy & Precautions, H&P , Patient's Chart, lab work & pertinent test results  Airway Mallampati: III TM Distance: >3 FB Neck ROM: full    Dental   Pulmonary  breath sounds clear to auscultation        Cardiovascular hypertension, Rhythm:regular Rate:Normal     Neuro/Psych    GI/Hepatic   Endo/Other  Morbid obesity  Renal/GU      Musculoskeletal   Abdominal   Peds  Hematology   Anesthesia Other Findings   Reproductive/Obstetrics (+) Pregnancy                           Anesthesia Physical Anesthesia Plan  ASA: III  Anesthesia Plan: Epidural   Post-op Pain Management:    Induction:   Airway Management Planned:   Additional Equipment:   Intra-op Plan:   Post-operative Plan:   Informed Consent: I have reviewed the patients History and Physical, chart, labs and discussed the procedure including the risks, benefits and alternatives for the proposed anesthesia with the patient or authorized representative who has indicated his/her understanding and acceptance.     Plan Discussed with:   Anesthesia Plan Comments:         Anesthesia Quick Evaluation  

## 2013-03-02 NOTE — Progress Notes (Signed)
Patient ID: Mindy Wade, female   DOB: 06-06-1980, 33 y.o.   MRN: [id number] Pt admitted for induction of labor. She has + GBS and she is allergic to penicillin. The GBS is resistant to clindamycin so she needs vancomycin. The cervix is 3 cm 50 % effaced and the vertex is at - 3 station.

## 2013-03-03 LAB — CBC
HEMATOCRIT: 33 % — AB (ref 36.0–46.0)
HEMOGLOBIN: 11 g/dL — AB (ref 12.0–15.0)
MCH: 27.8 pg (ref 26.0–34.0)
MCHC: 33.3 g/dL (ref 30.0–36.0)
MCV: 83.3 fL (ref 78.0–100.0)
Platelets: 228 10*3/uL (ref 150–400)
RBC: 3.96 MIL/uL (ref 3.87–5.11)
RDW: 14.9 % (ref 11.5–15.5)
WBC: 11.3 10*3/uL — ABNORMAL HIGH (ref 4.0–10.5)

## 2013-03-03 NOTE — Anesthesia Postprocedure Evaluation (Addendum)
  Anesthesia Post-op Note  Patient: Mindy Wade  Procedure(s) Performed: * No procedures listed *  Patient Location: PACU  Anesthesia Type:Epidural  Level of Consciousness: awake, alert , oriented and patient cooperative  Airway and Oxygen Therapy: Patient Spontanous Breathing  Post-op Pain: Mother/Baby  Post-op Assessment: Patient's Cardiovascular Status Stable, Respiratory Function Stable, Patent Airway, No signs of Nausea or vomiting, Adequate PO intake and Pain level controlled  Post-op Vital Signs: Reviewed and stable  Complications: No apparent anesthesia complications

## 2013-03-03 NOTE — Progress Notes (Signed)
Patient ID: Mindy Wade, female   DOB: 05-24-1980, 33 y.o.   MRN: [id number] #1 afebrile BP high normal HGB stable.

## 2013-03-04 MED ORDER — OXYCODONE-ACETAMINOPHEN 5-325 MG PO TABS: 1.0000 | ORAL_TABLET | Freq: Four times a day (QID) | ORAL | Status: DC | PRN

## 2013-03-04 MED ORDER — IBUPROFEN 600 MG PO TABS: 600.0000 mg | ORAL_TABLET | Freq: Four times a day (QID) | ORAL | Status: DC | PRN

## 2013-03-04 NOTE — Discharge Instructions (Signed)
booklet °

## 2013-03-04 NOTE — Progress Notes (Signed)
Patient ID: Mindy Wade, female   DOB: 1980-11-20, 33 y.o.   MRN: [id number] #2 afebrile BP slightly elevated For d/c

## 2013-03-04 NOTE — Discharge Summary (Signed)
NAME:  Brune[place] Dar[place]DA, Casmira                 ACCOUNT NO.:  MEDICAL RECORD NO.:  [id number]  LOCATION:                                 FACILITY:  PHYSICIAN:  Malachi Prohomas F. Ambrose MantleHenley, M.D. DATE OF BIRTH:  05/22/1980  DATE OF ADMISSION: DATE OF DISCHARGE:                              DISCHARGE SUMMARY   HISTORY OF PRESENT ILLNESS:  This is a 33 year old black female, para 4- 0-0-4, gravida 5, EDC March 05, 2013, admitted for induction of labor. Prenatal course complicated by a positive antibody E.  The father of the baby was checked and he was negative.  Other testing was normal.  Group B strep was positive.  Resistant to clindamycin.  The patient had an allergy to PENICILLIN, so she was treated with vancomycin.  The patient had elevated blood pressure prior to pregnancy, remained pretty much normal.  During pregnancy, it rose to 148/98 at 35 weeks and 6 days. PIH labs were normal.  After admission to the hospital, her cervix was 3 cm, 50%.  She was placed on Pitocin and vancomycin.  She received an epidural and by 12:57 p.m. with 8 milliunits of Pitocin, cervix was 4 cm, 50%, artificial rupture of the membranes was attempted.  At 1:50 p.m., I found her to be fully dilated.  With 2 contractions, she was able to deliver a healthy female infant, 7 pounds 8 ounces.  Apgars of 9 and nine at 1 and five minutes.  Perineum was intact.  Placenta was removed and uterus was normal.  Postpartum, the patient did very well and was discharged on the second postpartum day.  Initial hemoglobin 10.6, hematocrit 31.4, white count 9000, platelet count 230,000. Followup hemoglobins were 11.5 and 11.0.  FINAL DIAGNOSES:  Intrauterine pregnancy at term, delivered vertex.  OPERATION:  Spontaneous delivery vertex.  FINAL CONDITION:  Improved.  INSTRUCTIONS:  Include our regular discharge instruction booklet as well as our after visit summary.  Prescriptions for Motrin 600 mg 30 tablets, 1 every 6 hours as needed  for pain, Percocet 5/325, 30 tablets, 1 every 6 hours as needed for pain.  The patient is advised to return to the office in 6 weeks for followup examination.     Malachi Prohomas F. Ambrose MantleHenley, M.D.     TFH/MEDQ  D:  03/04/2013  T:  03/04/2013  Job:  [id number]

## 2013-03-05 ENCOUNTER — Inpatient Hospital Stay (HOSPITAL_COMMUNITY): Admission: RE | Admit: 2013-03-05 | Payer: 59 | Source: Ambulatory Visit | Admitting: Obstetrics and Gynecology

## 2013-03-06 LAB — TYPE AND SCREEN
ABO/RH(D): A POS
Antibody Screen: POSITIVE
DAT, IGG: NEGATIVE
DONOR AG TYPE: NEGATIVE
DONOR AG TYPE: NEGATIVE
UNIT DIVISION: 0
Unit division: 0

## 2013-03-13 ENCOUNTER — Encounter (HOSPITAL_COMMUNITY): Payer: Self-pay | Admitting: Emergency Medicine

## 2013-03-13 ENCOUNTER — Emergency Department (HOSPITAL_COMMUNITY)
Admission: EM | Admit: 2013-03-13 | Discharge: 2013-03-13 | Disposition: A | Discharge status: Home / Self Care | Payer: 59 | Source: Home / Self Care | Attending: Family Medicine | Admitting: Family Medicine

## 2013-03-13 DIAGNOSIS — J029 Acute pharyngitis, unspecified: Secondary | ICD-10-CM

## 2013-03-13 MED ORDER — AZITHROMYCIN 250 MG PO TABS: 250.0000 mg | ORAL_TABLET | Freq: Every day | ORAL | Status: DC

## 2013-03-13 NOTE — Discharge Instructions (Signed)
Bronchitis Bronchitis is inflammation of the airways that extend from the windpipe into the lungs (bronchi). The inflammation often causes mucus to develop, which leads to a cough. If the inflammation becomes severe, it may cause shortness of breath. CAUSES  Bronchitis may be caused by:   Viral infections.   Bacteria.   Cigarette smoke.   Allergens, pollutants, and other irritants.  SIGNS AND SYMPTOMS  The most common symptom of bronchitis is a frequent cough that produces mucus. Other symptoms include:  Fever.   Body aches.   Chest congestion.   Chills.   Shortness of breath.   Sore throat.  DIAGNOSIS  Bronchitis is usually diagnosed through a medical history and physical exam. Tests, such as chest X-rays, are sometimes done to rule out other conditions.  TREATMENT  You may need to avoid contact with whatever caused the problem (smoking, for example). Medicines are sometimes needed. These may include:  Antibiotics. These may be prescribed if the condition is caused by bacteria.  Cough suppressants. These may be prescribed for relief of cough symptoms.   Inhaled medicines. These may be prescribed to help open your airways and make it easier for you to breathe.   Steroid medicines. These may be prescribed for those with recurrent (chronic) bronchitis. HOME CARE INSTRUCTIONS  Get plenty of rest.   Drink enough fluids to keep your urine clear or pale yellow (unless you have a medical condition that requires fluid restriction). Increasing fluids may help thin your secretions and will prevent dehydration.   Only take over-the-counter or prescription medicines as directed by your health care provider.  Only take antibiotics as directed. Make sure you finish them even if you start to feel better.  Avoid secondhand smoke, irritating chemicals, and strong fumes. These will make bronchitis worse. If you are a smoker, quit smoking. Consider using nicotine gum or  skin patches to help control withdrawal symptoms. Quitting smoking will help your lungs heal faster.   Put a cool-mist humidifier in your bedroom at night to moisten the air. This may help loosen mucus. Change the water in the humidifier daily. You can also run the hot water in your shower and sit in the bathroom with the door closed for 5 10 minutes.   Follow up with your health care provider as directed.   Wash your hands frequently to avoid catching bronchitis again or spreading an infection to others.  SEEK MEDICAL CARE IF: Your symptoms do not improve after 1 week of treatment.  SEEK IMMEDIATE MEDICAL CARE IF:  Your fever increases.  You have chills.   You have chest pain.   You have worsening shortness of breath.   You have bloody sputum.  You faint.  You have lightheadedness.  You have a severe headache.   You vomit repeatedly. MAKE SURE YOU:   Understand these instructions.  Will watch your condition.  Will get help right away if you are not doing well or get worse. Document Released: 02/12/2005 Document Revised: 12/03/2012 Document Reviewed: 10/07/2012 [facility] Patient Information 2014 [place], [place]. Sore Throat A sore throat is pain, burning, irritation, or scratchiness of the throat. There is often pain or tenderness when swallowing or talking. A sore throat may be accompanied by other symptoms, such as coughing, sneezing, fever, and swollen neck glands. A sore throat is often the first sign of another sickness, such as a cold, flu, strep throat, or mononucleosis (commonly known as mono). Most sore throats go away without medical treatment. CAUSES  The  most common causes of a sore throat include:  A viral infection, such as a cold, flu, or mono.  A bacterial infection, such as strep throat, tonsillitis, or whooping cough.  Seasonal allergies.  Dryness in the air.  Irritants, such as smoke or pollution.  Gastroesophageal reflux disease  (GERD). HOME CARE INSTRUCTIONS   Only take over-the-counter medicines as directed by your caregiver.  Drink enough fluids to keep your urine clear or pale yellow.  Rest as needed.  Try using throat sprays, lozenges, or sucking on hard candy to ease any pain (if older than 4 years or as directed).  Sip warm liquids, such as broth, herbal tea, or warm water with honey to relieve pain temporarily. You may also eat or drink cold or frozen liquids such as frozen ice pops.  Gargle with salt water (mix 1 tsp salt with 8 oz of water).  Do not smoke and avoid secondhand smoke.  Put a cool-mist humidifier in your bedroom at night to moisten the air. You can also turn on a hot shower and sit in the bathroom with the door closed for 5 10 minutes. SEEK IMMEDIATE MEDICAL CARE IF:  You have difficulty breathing.  You are unable to swallow fluids, soft foods, or your saliva.  You have increased swelling in the throat.  Your sore throat does not get better in 7 days.  You have nausea and vomiting.  You have a fever or persistent symptoms for more than 2 3 days.  You have a fever and your symptoms suddenly get worse. MAKE SURE YOU:   Understand these instructions.  Will watch your condition.  Will get help right away if you are not doing well or get worse. Document Released: 03/22/2004 Document Revised: 01/30/2012 Document Reviewed: 10/21/2011 [facility] Patient Information 2014 [place], [place].

## 2013-03-13 NOTE — ED Notes (Signed)
Sore throat and cough, bladder leakage with cough.  2-3 weeks post partem.

## 2013-03-13 NOTE — ED Provider Notes (Signed)
CSN: [id number]     Arrival date & time 03/13/13  1844 History   First MD Initiated Contact with Patient 03/13/13 2002     No chief complaint on file.  (Consider location/radiation/quality/duration/timing/severity/associated sxs/prior Treatment) Patient is a 33 y.o. female presenting with cough. The history is provided by the patient. No language interpreter was used.  Cough Cough characteristics:  Non-productive Severity:  Moderate Onset quality:  Gradual Timing:  Constant Progression:  Worsening Relieved by:  Nothing Worsened by:  Nothing tried Ineffective treatments:  None tried Associated symptoms: headaches, sinus congestion and sore throat   Associated symptoms: no fever     Past Medical History  Diagnosis Date  . Medical history non-contributory   . Hx of varicella    Past Surgical History  Procedure Laterality Date  . No past surgeries     Family History  Problem Relation Age of Onset  . Asthma Mother   . Heart disease Mother   . Hypertension Mother   . Stroke Mother   . Cancer Father     prostate  . Alcohol abuse Neg Hx   . Arthritis Neg Hx   . Birth defects Neg Hx   . COPD Neg Hx   . Depression Neg Hx   . Diabetes Neg Hx   . Drug abuse Neg Hx   . Early death Neg Hx   . Hearing loss Neg Hx   . Hyperlipidemia Neg Hx   . Kidney disease Neg Hx   . Learning disabilities Neg Hx   . Mental illness Neg Hx   . Mental retardation Neg Hx   . Miscarriages / Stillbirths Neg Hx   . Vision loss Neg Hx   . Varicose Veins Neg Hx    History  Substance Use Topics  . Smoking status: Never Smoker   . Smokeless tobacco: Never Used  . Alcohol Use: No   OB History   Grav Para Term Preterm Abortions TAB SAB Ect Mult Living   5 5 5       5      Review of Systems  Constitutional: Negative for fever.  HENT: Positive for sore throat.   Respiratory: Positive for cough.   Neurological: Positive for headaches.  All other systems reviewed and are  negative.    Allergies  Penicillins  Home Medications   Current Outpatient Rx  Name  Route  Sig  Dispense  Refill  . ibuprofen (ADVIL,MOTRIN) 600 MG tablet   Oral   Take 1 tablet (600 mg total) by mouth every 6 (six) hours as needed.   30 tablet   0   . oxyCODONE-acetaminophen (PERCOCET/ROXICET) 5-325 MG per tablet   Oral   Take 1 tablet by mouth every 6 (six) hours as needed for moderate pain.   30 tablet   0   . Prenatal Vit-Fe Fumarate-FA (PRENATAL MULTIVITAMIN) TABS tablet   Oral   Take 1 tablet by mouth daily at 12 noon.          BP 182/120  Pulse 90  Temp(Src) 98.8 F (37.1 C) (Oral)  Resp 20  SpO2 98%  LMP 05/31/2012 Physical Exam  Nursing note and vitals reviewed. Constitutional: She appears well-developed and well-nourished.  HENT:  Head: Normocephalic.  Right Ear: External ear normal.  Left Ear: External ear normal.  Nose: Nose normal.  Erythema throat  Eyes: Conjunctivae and EOM are normal. Pupils are equal, round, and reactive to light.  Neck: Normal range of motion. Neck supple.  Cardiovascular:  Normal rate.   Pulmonary/Chest: Effort normal.  Musculoskeletal: Normal range of motion.  Neurological: She is alert.  Skin: Skin is warm.    ED Course  Procedures (including critical care time) Labs Review Labs Reviewed - No data to display Imaging Review No results found.  EKG Interpretation    Date/Time:    Ventricular Rate:    PR Interval:    QRS Duration:   QT Interval:    QTC Calculation:   R Axis:     Text Interpretation:              MDM  No diagnosis found.  Rx for zithromax  See your Physicain as scheduled for recheck of blood pressure  Elson AreasLeslie K Manuel Dall, New JerseyPA-C 03/13/13 2031

## 2013-03-17 NOTE — ED Provider Notes (Signed)
Medical screening examination/treatment/procedure(s) were performed by resident physician or non-physician practitioner and as supervising physician I was immediately available for consultation/collaboration.   Malai Lady DOUGLAS MD.   Zorianna Taliaferro D Michell Kader, MD 03/17/13 1404 

## 2013-04-11 ENCOUNTER — Emergency Department (HOSPITAL_COMMUNITY): Payer: 59

## 2013-04-11 ENCOUNTER — Emergency Department (HOSPITAL_COMMUNITY)
Admission: EM | Admit: 2013-04-11 | Discharge: 2013-04-11 | Disposition: A | Discharge status: Home / Self Care | Payer: 59 | Attending: Emergency Medicine | Admitting: Emergency Medicine

## 2013-04-11 ENCOUNTER — Encounter (HOSPITAL_COMMUNITY): Payer: Self-pay | Admitting: Emergency Medicine

## 2013-04-11 DIAGNOSIS — Z789 Other specified health status: Secondary | ICD-10-CM | POA: Insufficient documentation

## 2013-04-11 DIAGNOSIS — Z8619 Personal history of other infectious and parasitic diseases: Secondary | ICD-10-CM | POA: Insufficient documentation

## 2013-04-11 DIAGNOSIS — R209 Unspecified disturbances of skin sensation: Secondary | ICD-10-CM | POA: Insufficient documentation

## 2013-04-11 DIAGNOSIS — K8050 Calculus of bile duct without cholangitis or cholecystitis without obstruction: Secondary | ICD-10-CM

## 2013-04-11 DIAGNOSIS — K8020 Calculus of gallbladder without cholecystitis without obstruction: Secondary | ICD-10-CM | POA: Insufficient documentation

## 2013-04-11 DIAGNOSIS — Z880 Allergy status to penicillin: Secondary | ICD-10-CM | POA: Insufficient documentation

## 2013-04-11 LAB — CBC WITH DIFFERENTIAL/PLATELET
Basophils Absolute: 0 K/uL (ref 0.0–0.1)
Basophils Relative: 0 % (ref 0–1)
Eosinophils Absolute: 0.2 K/uL (ref 0.0–0.7)
Eosinophils Relative: 2 % (ref 0–5)
HCT: 36.6 % (ref 36.0–46.0)
Hemoglobin: 12 g/dL (ref 12.0–15.0)
Lymphocytes Relative: 43 % (ref 12–46)
Lymphs Abs: 3.8 K/uL (ref 0.7–4.0)
MCH: 27.8 pg (ref 26.0–34.0)
MCHC: 32.8 g/dL (ref 30.0–36.0)
MCV: 84.7 fL (ref 78.0–100.0)
Monocytes Absolute: 0.8 K/uL (ref 0.1–1.0)
Monocytes Relative: 9 % (ref 3–12)
Neutro Abs: 4.1 K/uL (ref 1.7–7.7)
Neutrophils Relative %: 46 % (ref 43–77)
Platelets: 272 K/uL (ref 150–400)
RBC: 4.32 MIL/uL (ref 3.87–5.11)
RDW: 16.9 % — ABNORMAL HIGH (ref 11.5–15.5)
WBC: 8.9 K/uL (ref 4.0–10.5)

## 2013-04-11 LAB — COMPREHENSIVE METABOLIC PANEL
ALT: 8 U/L (ref 0–35)
AST: 16 U/L (ref 0–37)
Albumin: 3.2 g/dL — ABNORMAL LOW (ref 3.5–5.2)
Alkaline Phosphatase: 105 U/L (ref 39–117)
BUN: 13 mg/dL (ref 6–23)
CO2: 23 mEq/L (ref 19–32)
Calcium: 8.8 mg/dL (ref 8.4–10.5)
Chloride: 102 mEq/L (ref 96–112)
Creatinine, Ser: 0.64 mg/dL (ref 0.50–1.10)
Glucose, Bld: 95 mg/dL (ref 70–99)
Potassium: 4 mEq/L (ref 3.7–5.3)
Sodium: 140 mEq/L (ref 137–147)
TOTAL PROTEIN: 7.8 g/dL (ref 6.0–8.3)
Total Bilirubin: <0.2 mg/dL — ABNORMAL LOW (ref 0.3–1.2)

## 2013-04-11 LAB — URINALYSIS, ROUTINE W REFLEX MICROSCOPIC
BILIRUBIN URINE: NEGATIVE
Glucose, UA: NEGATIVE mg/dL
Hgb urine dipstick: NEGATIVE
Ketones, ur: NEGATIVE mg/dL
Leukocytes, UA: NEGATIVE
NITRITE: NEGATIVE
Protein, ur: NEGATIVE mg/dL
SPECIFIC GRAVITY, URINE: 1.018 (ref 1.005–1.030)
Urobilinogen, UA: 0.2 mg/dL (ref 0.0–1.0)
pH: 6.5 (ref 5.0–8.0)

## 2013-04-11 LAB — POCT PREGNANCY, URINE: Preg Test, Ur: NEGATIVE

## 2013-04-11 LAB — LIPASE, BLOOD: Lipase: 36 U/L (ref 11–59)

## 2013-04-11 MED ORDER — FENTANYL CITRATE 0.05 MG/ML IJ SOLN
50.0000 ug | Freq: Once | INTRAMUSCULAR | Status: AC
  Administered 2013-04-11: 50 ug via INTRAVENOUS
  Filled 2013-04-11: qty 2

## 2013-04-11 MED ORDER — ONDANSETRON HCL 4 MG/2ML IJ SOLN
4.0000 mg | Freq: Once | INTRAMUSCULAR | Status: AC
  Administered 2013-04-11: 4 mg via INTRAVENOUS
  Filled 2013-04-11: qty 2

## 2013-04-11 NOTE — ED Notes (Signed)
Pt discharged home with all belongings, alert and ambulatory upon discharge, no new RX, pt verbalizes understanding of discharge instructions, pt driven home by boyfriend

## 2013-04-11 NOTE — ED Notes (Signed)
Patient transported to Ultrasound 

## 2013-04-11 NOTE — ED Provider Notes (Signed)
CSN: [id number]     Arrival date & time 04/11/13  4098 History   First MD Initiated Contact with Patient 04/11/13 [phone]     Chief Complaint  Patient presents with  . Abdominal Pain     (Consider location/radiation/quality/duration/timing/severity/associated sxs/prior Treatment) Patient is a 33 y.o. female presenting with abdominal pain. The history is provided by the patient.  Abdominal Pain Pain location:  RUQ Associated symptoms: nausea   Associated symptoms: no chest pain, no diarrhea, no shortness of breath and no vomiting    patient presents with right upper quadrant abdominal pain. Began this morning after eating some cake for breakfast. She she has a history of gallstones but does not have any pain with for the last 3 years. The pain is somewhat sharp but also is a dull component.mild nausea. No diarrhea or constipation. No fevers. She had a baby in January. She is currently breast-feeding.  Past Medical History  Diagnosis Date  . Medical history non-contributory   . Hx of varicella   . Breast feeding status of mother    Past Surgical History  Procedure Laterality Date  . No past surgeries     Family History  Problem Relation Age of Onset  . Asthma Mother   . Heart disease Mother   . Hypertension Mother   . Stroke Mother   . Cancer Father     prostate  . Alcohol abuse Neg Hx   . Arthritis Neg Hx   . Birth defects Neg Hx   . COPD Neg Hx   . Depression Neg Hx   . Diabetes Neg Hx   . Drug abuse Neg Hx   . Early death Neg Hx   . Hearing loss Neg Hx   . Hyperlipidemia Neg Hx   . Kidney disease Neg Hx   . Learning disabilities Neg Hx   . Mental illness Neg Hx   . Mental retardation Neg Hx   . Miscarriages / Stillbirths Neg Hx   . Vision loss Neg Hx   . Varicose Veins Neg Hx    History  Substance Use Topics  . Smoking status: Never Smoker   . Smokeless tobacco: Never Used  . Alcohol Use: No   OB History   Grav Para Term Preterm Abortions TAB SAB Ect Mult  Living   5 5 5       5      Review of Systems  Constitutional: Negative for activity change and appetite change.  Eyes: Negative for pain.  Respiratory: Negative for chest tightness and shortness of breath.   Cardiovascular: Negative for chest pain and leg swelling.  Gastrointestinal: Positive for nausea and abdominal pain. Negative for vomiting and diarrhea.  Genitourinary: Negative for flank pain.  Musculoskeletal: Negative for back pain and neck stiffness.  Skin: Negative for rash.  Neurological: Negative for weakness, numbness and headaches.  Psychiatric/Behavioral: Negative for behavioral problems.      Allergies  Penicillins  Home Medications   Current Outpatient Rx  Name  Route  Sig  Dispense  Refill  . acetaminophen (TYLENOL) 325 MG tablet   Oral   Take 650 mg by mouth every 6 (six) hours as needed for mild pain.          BP 117/87  Pulse 75  Temp(Src) 97.7 F (36.5 C) (Oral)  Resp 18  SpO2 99%  Breastfeeding? Yes Physical Exam  Nursing note and vitals reviewed. Constitutional: She is oriented to person, place, and time. She appears well-developed and well-nourished.  Patient is obese  HENT:  Head: Normocephalic and atraumatic.  Eyes: Pupils are equal, round, and reactive to light.  Neck: Neck supple.  Cardiovascular: Normal rate, regular rhythm and normal heart sounds.   No murmur heard. Pulmonary/Chest: Effort normal and breath sounds normal. No respiratory distress. She has no wheezes. She has no rales.  Abdominal: Soft. Bowel sounds are normal. She exhibits no distension. There is tenderness. There is no rebound and no guarding.  Mild right upper quadrant tenderness without rebound or guarding. No masses palpated  Musculoskeletal: She exhibits no edema.  Neurological: She is alert and oriented to person, place, and time. No cranial nerve deficit.  Skin: Skin is warm and dry.  Psychiatric: She has a normal mood and affect. Her speech is normal.     ED Course  Procedures (including critical care time) Labs Review Labs Reviewed  CBC WITH DIFFERENTIAL - Abnormal; Notable for the following:    RDW 16.9 (*)    All other components within normal limits  COMPREHENSIVE METABOLIC PANEL - Abnormal; Notable for the following:    Albumin 3.2 (*)    Total Bilirubin <0.2 (*)    All other components within normal limits  URINALYSIS, ROUTINE W REFLEX MICROSCOPIC - Abnormal; Notable for the following:    APPearance HAZY (*)    All other components within normal limits  LIPASE, BLOOD  POCT PREGNANCY, URINE   Imaging Review US Abdomen Complete  04/11/2013   CLINICAL DATA:  Right upper quadrant abdominal pain.  EXAM: ULTRASOUND ABDOMEN COMPLETE  COMPARISON:  Abdominal ultrasound 11/22/2009.  FINDINGS: Gallbladder:  Multiple dependent echogenic foci with posterior acoustic shadowing compatible with small gallstones, largest of which measures approximately 1.3 cm. Gallbladder is only moderately distended, with a normal wall thickness of 3 mm. No abnormal pericholecystic fluid. Per report from the sonographer, the patient did not exhibit a sonographic Murphy's sign on examination.  Common bile duct:  Diameter: Common bile duct appears upper limits of normal in diameter measuring 6 mm in the porta hepatis no intrahepatic biliary ductal dilatation.  Liver:  No focal lesion identified. Within normal limits in parenchymal echogenicity.  IVC:  No abnormality visualized.  Pancreas:  Visualized portion unremarkable.  Spleen:  Size and appearance within normal limits.  7.1 cm in length  Right Kidney:  Length: 12.6 cm. Echogenicity within normal limits. No mass or hydronephrosis visualized.  Left Kidney:  Length: 12.3 cm. Echogenicity within normal limits. No mass or hydronephrosis visualized.  Abdominal aorta:  No aneurysm visualized. Measures up to 2.2 cm in diameter proximally. Distal aorta and bifurcation could not be visualized secondary to overlying bowel gas.   Other findings:  None.  IMPRESSION: 1. Cholelithiasis without evidence to suggest acute cholecystitis at this time. 2. Common bile duct is upper limits of normal in size at 6 mm. This is similar to the prior study from 11/22/2009 at which point the duct measured 5.5 mm. No intrahepatic biliary ductal dilatation to strongly suggest presence of choledocholithiasis. Correlation with liver function tests is recommended.   Electronically Signed   By: Trudie Reed M.D.   On: 04/11/2013 09:38    EKG Interpretation   None       MDM   Final diagnoses:  Biliary colic    Patient with right upper quadrant abdominal pain with known gallstones. Rather benign exam. Ultrasound shows cholelithiasis without cholecystitis. Common bile duct is mildly elevated, however it was similar diameter 3 years ago with her last test. LFTs are  normal. Will discharge home to followup with general surgery.    Juliet RudeNathan R. Rubin PayorPickering, MD 04/11/13 1012

## 2013-04-11 NOTE — ED Notes (Signed)
Woke up this am with epigastric pain. Believes could be related to gall stones. similar flare up x 3 years ago.

## 2013-04-11 NOTE — Discharge Instructions (Signed)

## 2013-04-24 ENCOUNTER — Emergency Department (HOSPITAL_COMMUNITY)
Admission: EM | Admit: 2013-04-24 | Discharge: 2013-04-25 | Disposition: A | Discharge status: Home / Self Care | Payer: 59 | Attending: Emergency Medicine | Admitting: Emergency Medicine

## 2013-04-24 DIAGNOSIS — Z880 Allergy status to penicillin: Secondary | ICD-10-CM | POA: Insufficient documentation

## 2013-04-24 DIAGNOSIS — K8020 Calculus of gallbladder without cholecystitis without obstruction: Secondary | ICD-10-CM | POA: Insufficient documentation

## 2013-04-24 DIAGNOSIS — Z3202 Encounter for pregnancy test, result negative: Secondary | ICD-10-CM | POA: Insufficient documentation

## 2013-04-24 DIAGNOSIS — Z8619 Personal history of other infectious and parasitic diseases: Secondary | ICD-10-CM | POA: Insufficient documentation

## 2013-04-25 ENCOUNTER — Encounter (HOSPITAL_COMMUNITY): Payer: Self-pay | Admitting: Emergency Medicine

## 2013-04-25 ENCOUNTER — Emergency Department (HOSPITAL_COMMUNITY): Payer: 59

## 2013-04-25 LAB — URINALYSIS, ROUTINE W REFLEX MICROSCOPIC
Bilirubin Urine: NEGATIVE
Glucose, UA: NEGATIVE mg/dL
Hgb urine dipstick: NEGATIVE
Ketones, ur: NEGATIVE mg/dL
NITRITE: NEGATIVE
PH: 6.5 (ref 5.0–8.0)
Protein, ur: NEGATIVE mg/dL
SPECIFIC GRAVITY, URINE: 1.012 (ref 1.005–1.030)
Urobilinogen, UA: 1 mg/dL (ref 0.0–1.0)

## 2013-04-25 LAB — CBC WITH DIFFERENTIAL/PLATELET
BASOS ABS: 0 10*3/uL (ref 0.0–0.1)
BASOS PCT: 0 % (ref 0–1)
EOS ABS: 0.1 10*3/uL (ref 0.0–0.7)
Eosinophils Relative: 1 % (ref 0–5)
HCT: 37.3 % (ref 36.0–46.0)
Hemoglobin: 12.1 g/dL (ref 12.0–15.0)
Lymphocytes Relative: 32 % (ref 12–46)
Lymphs Abs: 2.4 10*3/uL (ref 0.7–4.0)
MCH: 27.4 pg (ref 26.0–34.0)
MCHC: 32.4 g/dL (ref 30.0–36.0)
MCV: 84.6 fL (ref 78.0–100.0)
Monocytes Absolute: 0.7 10*3/uL (ref 0.1–1.0)
Monocytes Relative: 9 % (ref 3–12)
Neutro Abs: 4.4 10*3/uL (ref 1.7–7.7)
Neutrophils Relative %: 58 % (ref 43–77)
PLATELETS: 317 10*3/uL (ref 150–400)
RBC: 4.41 MIL/uL (ref 3.87–5.11)
RDW: 16.6 % — AB (ref 11.5–15.5)
WBC: 7.7 10*3/uL (ref 4.0–10.5)

## 2013-04-25 LAB — LIPASE, BLOOD: Lipase: 21 U/L (ref 11–59)

## 2013-04-25 LAB — COMPREHENSIVE METABOLIC PANEL
ALBUMIN: 3.3 g/dL — AB (ref 3.5–5.2)
ALK PHOS: 109 U/L (ref 39–117)
ALT: 18 U/L (ref 0–35)
AST: 32 U/L (ref 0–37)
BUN: 10 mg/dL (ref 6–23)
CO2: 22 mEq/L (ref 19–32)
Calcium: 8.8 mg/dL (ref 8.4–10.5)
Chloride: 99 mEq/L (ref 96–112)
Creatinine, Ser: 0.61 mg/dL (ref 0.50–1.10)
GFR calc Af Amer: >90 mL/min (ref >90)
GFR calc non Af Amer: >90 mL/min (ref >90)
Glucose, Bld: 116 mg/dL — ABNORMAL HIGH (ref 70–99)
POTASSIUM: 3.8 meq/L (ref 3.7–5.3)
SODIUM: 136 meq/L — AB (ref 137–147)
TOTAL PROTEIN: 7.4 g/dL (ref 6.0–8.3)
Total Bilirubin: <0.2 mg/dL — ABNORMAL LOW (ref 0.3–1.2)

## 2013-04-25 LAB — URINE MICROSCOPIC-ADD ON

## 2013-04-25 LAB — POC URINE PREG, ED: Preg Test, Ur: NEGATIVE

## 2013-04-25 MED ORDER — HYDROCODONE-ACETAMINOPHEN 5-325 MG PO TABS
2.0000 | ORAL_TABLET | Freq: Once | ORAL | Status: AC
  Administered 2013-04-25: 2 via ORAL
  Filled 2013-04-25: qty 2

## 2013-04-25 NOTE — ED Provider Notes (Signed)
CSN: [id number]     Arrival date & time 04/24/13  2348 History   First MD Initiated Contact with Patient 04/25/13 0147     Chief Complaint  Patient presents with  . Abdominal Pain   HPI  History provided by the patient. Patient is a 33 year old Wade with history of gallstones who presents with complaints of sharp upper abdominal pains. Patient reports symptoms began this evening shortly after eating hot dog around 11 PM. She states pain feels similar to previous gallbladder symptoms. She reports having problems similar to this over 2 years ago which seemed to resolve and be improved with diet change. Recently she was having some similar return of symptoms over the past one to 2 weeks. She was evaluated recently for similar sharp pains at [facility] emergency room. She was given instructions to followup with the surgeon but she states she has been putting this off and has not called for any appointment. This evening symptoms were associated with some nausea. No episodes of vomiting. Denies any fever, chills or sweats. No recent diarrhea or constipation symptoms. No other aggravating or alleviating factors. No other associated symptoms.    Past Medical History  Diagnosis Date  . Medical history non-contributory   . Hx of varicella   . Breast feeding status of mother    Past Surgical History  Procedure Laterality Date  . No past surgeries     Family History  Problem Relation Age of Onset  . Asthma Mother   . Heart disease Mother   . Hypertension Mother   . Stroke Mother   . Cancer Father     prostate  . Alcohol abuse Neg Hx   . Arthritis Neg Hx   . Birth defects Neg Hx   . COPD Neg Hx   . Depression Neg Hx   . Diabetes Neg Hx   . Drug abuse Neg Hx   . Early death Neg Hx   . Hearing loss Neg Hx   . Hyperlipidemia Neg Hx   . Kidney disease Neg Hx   . Learning disabilities Neg Hx   . Mental illness Neg Hx   . Mental retardation Neg Hx   . Miscarriages / Stillbirths Neg Hx   .  Vision loss Neg Hx   . Varicose Veins Neg Hx    History  Substance Use Topics  . Smoking status: Never Smoker   . Smokeless tobacco: Never Used  . Alcohol Use: No   OB History   Grav Para Term Preterm Abortions TAB SAB Ect Mult Living   5 5 5       5      Review of Systems  Constitutional: Negative for fever, chills and diaphoresis.  Gastrointestinal: Positive for nausea and abdominal pain. Negative for vomiting, diarrhea and constipation.  Genitourinary: Negative for dysuria, frequency, hematuria and flank pain.  All other systems reviewed and are negative.      Allergies  Penicillins  Home Medications   Current Outpatient Rx  Name  Route  Sig  Dispense  Refill  . ibuprofen (ADVIL,MOTRIN) 200 MG tablet   Oral   Take 400 mg by mouth every 6 (six) hours as needed for moderate pain.          BP 163/123  Pulse 101  Temp(Src) 97.7 F (36.5 C) (Oral)  Resp 20  SpO2 99%  LMP 04/15/2013 Physical Exam  Nursing note and vitals reviewed. Constitutional: She is oriented to person, place, and time. She appears well-developed  and well-nourished. No distress.  HENT:  Head: Normocephalic.  Cardiovascular: Normal rate and regular rhythm.   No murmur heard. Pulmonary/Chest: Effort normal and breath sounds normal. No respiratory distress. She has no wheezes. She has no rales.  Abdominal: Soft. She exhibits no distension. There is no hepatosplenomegaly. There is tenderness in the right upper quadrant. There is CVA tenderness. There is no rigidity, no rebound, no guarding, no tenderness at McBurney's point and negative Murphy's sign.  Obese.  Musculoskeletal: Normal range of motion.  Neurological: She is alert and oriented to person, place, and time.  Skin: Skin is warm and dry. No rash noted.  Psychiatric: She has a normal mood and affect. Her behavior is normal.    ED Course  Procedures    DIAGNOSTIC STUD 99% on room air.    COORDINATION OF CARE:  Nursing notes  reviewed. Vital signs reviewed. Initial pt interview and examination performed.   3:05 AM-patient seen and evaluated. She reports having improvement of pains already. She did take ibuprofen for pain at home. Does not appear in any acute distress. Does not appear severely ill or toxic. No fever. Discussed work up plan with pt at bedside, which includes lab testing and ultrasound. Pt agrees with plan.  Patient reports feeling significantly improved. Blood pressure and heart rate improved after pain has improved. Labs didn't show any concerning findings. Ultrasound shows gallstones without any secondary to medications. At this time patient may return home. She's been advised to followup with a general surgeon. She agrees with plan.   Treatment plan initiated: Medications  HYDROcodone-acetaminophen (NORCO/VICODIN) 5-325 MG per tablet 2 tablet (not administered)   Results for orders placed during the hospital encounter of 04/24/13  CBC WITH DIFFERENTIAL      Result Value Ref Range   WBC 7.7  4.0 - 10.5 K/uL   RBC 4.41  3.87 - 5.11 MIL/uL   Hemoglobin 12.1  12.0 - 15.0 g/dL   HCT 16.1  09.6 - 04.5 %   MCV 84.6  78.0 - 100.0 fL   MCH 27.4  26.0 - 34.0 pg   MCHC 32.4  30.0 - 36.0 g/dL   RDW 40.9 (*) 81.1 - 91.4 %   Platelets 317  150 - 400 K/uL   Neutrophils Relative % 58  43 - 77 %   Neutro Abs 4.4  1.7 - 7.7 K/uL   Lymphocytes Relative 32  12 - Mindy %   Lymphs Abs 2.4  0.7 - 4.0 K/uL   Monocytes Relative 9  3 - 12 %   Monocytes Absolute 0.7  0.1 - 1.0 K/uL   Eosinophils Relative 1  0 - 5 %   Eosinophils Absolute 0.1  0.0 - 0.7 K/uL   Basophils Relative 0  0 - 1 %   Basophils Absolute 0.0  0.0 - 0.1 K/uL  COMPREHENSIVE METABOLIC PANEL      Result Value Ref Range   Sodium 136 (*) 137 - 147 mEq/L   Potassium 3.8  3.7 - 5.3 mEq/L   Chloride 99  96 - 112 mEq/L   CO2 22  19 - 32 mEq/L   Glucose, Bld 116 (*) 70 - 99 mg/dL   BUN 10  6 - 23 mg/dL   Creatinine, Ser 7.82  0.50 - 1.10 mg/dL    Calcium 8.8  8.4 - 95.6 mg/dL   Total Protein 7.4  6.0 - 8.3 g/dL   Albumin 3.3 (*) 3.5 - 5.2 g/dL   AST 32  0 - 37 U/L   ALT 18  0 - 35 U/L   Alkaline Phosphatase 109  39 - 117 U/L   Total Bilirubin <0.2 (*) 0.3 - 1.2 mg/dL   GFR calc non Af Amer >90  >90 mL/min   GFR calc Af Amer >90  >90 mL/min  LIPASE, BLOOD      Result Value Ref Range   Lipase 21  11 - 59 U/L  URINALYSIS, ROUTINE W REFLEX MICROSCOPIC      Result Value Ref Range   Color, Urine YELLOW  YELLOW   APPearance CLOUDY (*) CLEAR   Specific Gravity, Urine 1.012  1.005 - 1.030   pH 6.5  5.0 - 8.0   Glucose, UA NEGATIVE  NEGATIVE mg/dL   Hgb urine dipstick NEGATIVE  NEGATIVE   Bilirubin Urine NEGATIVE  NEGATIVE   Ketones, ur NEGATIVE  NEGATIVE mg/dL   Protein, ur NEGATIVE  NEGATIVE mg/dL   Urobilinogen, UA 1.0  0.0 - 1.0 mg/dL   Nitrite NEGATIVE  NEGATIVE   Leukocytes, UA TRACE (*) NEGATIVE  URINE MICROSCOPIC-ADD ON      Result Value Ref Range   Squamous Epithelial / LPF MANY (*) RARE   WBC, UA 3-6  <3 WBC/hpf  POC URINE PREG, ED      Result Value Ref Range   Preg Test, Ur NEGATIVE  NEGATIVE       Imaging Review Koreas Abdomen Limited Ruq  04/25/2013   CLINICAL DATA:  Right upper quadrant abdominal pain, gallstones, evaluate for cholecystitis  EXAM: US ABDOMEN LIMITED - RIGHT UPPER QUADRANT  COMPARISON:  04/11/2013  FINDINGS: Gallbladder:  Distended gallbladder with multiple layering gallstones measuring up to 1.5 cm. No gallbladder wall thickening or pericholecystic fluid. Negative sonographic Murphy's sign.  Common bile duct:  Diameter: 4 mm.  Liver:  No focal lesion identified. Within normal limits in parenchymal echogenicity.  IMPRESSION: Distended gallbladder with cholelithiasis. No associated sonographic findings to suggest acute cholecystitis.   Electronically Signed   By: Charline BillsSriyesh  Krishnan M.D.   On: 04/25/2013 02:57     EKG Interpretation None      MDM   Final diagnoses:  Cholelithiases         Angus Sellereter S Shawna Kiener, PA-C 04/25/13 [phone]

## 2013-04-25 NOTE — Discharge Instructions (Signed)
Please followup with the general surgery specialist for continued evaluation of your gallstones and symptoms.    Cholelithiasis Cholelithiasis (also called gallstones) is a form of gallbladder disease in which gallstones form in your gallbladder. The gallbladder is an organ that stores bile made in the liver, which helps digest fats. Gallstones begin as small crystals and slowly grow into stones. Gallstone pain occurs when the gallbladder spasms and a gallstone is blocking the duct. Pain can also occur when a stone passes out of the duct.  RISK FACTORS  Being female.   Having multiple pregnancies. Health care providers sometimes advise removing diseased gallbladders before future pregnancies.   Being obese.  Eating a diet heavy in fried foods and fat.   Being older than 60 years and increasing age.   Prolonged use of medicines containing female hormones.   Having diabetes mellitus.   Rapidly losing weight.   Having a family history of gallstones (heredity).  SYMPTOMS  Nausea.   Vomiting.  Abdominal pain.   Yellowing of the skin (jaundice).   Sudden pain. It may persist from several minutes to several hours.  Fever.   Tenderness to the touch. In some cases, when gallstones do not move into the bile duct, people have no pain or symptoms. These are called "silent" gallstones.  TREATMENT Silent gallstones do not need treatment. In severe cases, emergency surgery may be required. Options for treatment include:  Surgery to remove the gallbladder. This is the most common treatment.  Medicines. These do not always work and may take 6 12 months or more to work.  Shock wave treatment (extracorporeal biliary lithotripsy). In this treatment an ultrasound machine sends shock waves to the gallbladder to break gallstones into smaller pieces that can pass into the intestines or be dissolved by medicine. HOME CARE INSTRUCTIONS   Only take over-the-counter or  prescription medicines for pain, discomfort, or fever as directed by your health care provider.   Follow a low-fat diet until seen again by your health care provider. Fat causes the gallbladder to contract, which can result in pain.   Follow up with your health care provider as directed. Attacks are almost always recurrent and surgery is usually required for permanent treatment.  SEEK IMMEDIATE MEDICAL CARE IF:   Your pain increases and is not controlled by medicines.   You have a fever or persistent symptoms for more than 2 3 days.   You have a fever and your symptoms suddenly get worse.   You have persistent nausea and vomiting.  MAKE SURE YOU:   Understand these instructions.  Will watch your condition.  Will get help right away if you are not doing well or get worse. Document Released: 02/08/2005 Document Revised: 10/15/2012 Document Reviewed: 08/06/2012 [facility] Patient Information 2014 [employer].

## 2013-04-25 NOTE — ED Provider Notes (Signed)
Medical screening examination/treatment/procedure(s) were performed by non-physician practitioner and as supervising physician I was immediately available for consultation/collaboration.   Dione Boozeavid Kassem Kibbe, MD 04/25/13 [phone]

## 2013-04-25 NOTE — ED Notes (Signed)
Pt arrived to the ED with a complaint of abdominal pain.  Pt has a hx of gallstones and referenced this as the source of her pain.  Pt states pain is located in the upper quadrants of her abdomen. Pt states feels like labor pains.  Pt states the pain is constant and has been going on for two weeks.

## 2013-08-26 ENCOUNTER — Encounter (HOSPITAL_COMMUNITY): Payer: Self-pay | Admitting: Emergency Medicine

## 2013-08-26 ENCOUNTER — Emergency Department (HOSPITAL_COMMUNITY)
Admission: EM | Admit: 2013-08-26 | Discharge: 2013-08-26 | Disposition: A | Discharge status: Home / Self Care | Payer: Medicaid Other | Attending: Emergency Medicine | Admitting: Emergency Medicine

## 2013-08-26 DIAGNOSIS — M79609 Pain in unspecified limb: Secondary | ICD-10-CM | POA: Insufficient documentation

## 2013-08-26 DIAGNOSIS — M79632 Pain in left forearm: Secondary | ICD-10-CM

## 2013-08-26 DIAGNOSIS — Z880 Allergy status to penicillin: Secondary | ICD-10-CM | POA: Insufficient documentation

## 2013-08-26 DIAGNOSIS — Z79899 Other long term (current) drug therapy: Secondary | ICD-10-CM | POA: Diagnosis not present

## 2013-08-26 DIAGNOSIS — X503XXA Overexertion from repetitive movements, initial encounter: Secondary | ICD-10-CM | POA: Insufficient documentation

## 2013-08-26 DIAGNOSIS — Z8619 Personal history of other infectious and parasitic diseases: Secondary | ICD-10-CM | POA: Diagnosis not present

## 2013-08-26 DIAGNOSIS — IMO0002 Reserved for concepts with insufficient information to code with codable children: Secondary | ICD-10-CM | POA: Insufficient documentation

## 2013-08-26 DIAGNOSIS — Y929 Unspecified place or not applicable: Secondary | ICD-10-CM | POA: Diagnosis not present

## 2013-08-26 DIAGNOSIS — Y9389 Activity, other specified: Secondary | ICD-10-CM | POA: Diagnosis not present

## 2013-08-26 DIAGNOSIS — T148XXA Other injury of unspecified body region, initial encounter: Secondary | ICD-10-CM

## 2013-08-26 MED ORDER — IBUPROFEN 600 MG PO TABS: 600.0000 mg | ORAL_TABLET | Freq: Four times a day (QID) | ORAL | Status: DC | PRN

## 2013-08-26 NOTE — Discharge Instructions (Signed)
Please read and follow all provided instructions.  Your diagnoses today include:  1. Pain of left forearm   2. Muscle strain     Tests performed today include:  Vital signs. See below for your results today.   Medications prescribed:   Ibuprofen (Motrin, Advil) - anti-inflammatory pain medication  Do not exceed 600mg  ibuprofen every 6 hours, take with food  You have been prescribed an anti-inflammatory medication or NSAID. Take with food. Take smallest effective dose for the shortest duration needed for your pain. Stop taking if you experience stomach pain or vomiting.   Take any prescribed medications only as directed.  Home care instructions:   Follow any educational materials contained in this packet  Follow R.I.C.E. Protocol:  R - rest your injury   I  - use ice on injury without applying directly to skin  C - compress injury with bandage or splint  E - elevate the injury as much as possible  Follow-up instructions: Please follow-up with your primary care provider if you continue to have significant pain or trouble walking in 1 week. In this case you may have a severe injury that requires further care.   Return instructions:   Please return if your toes are numb or tingling, appear gray or blue, or you have severe pain (also elevate leg and loosen splint or wrap if you were given one)  Please return to the Emergency Department if you experience worsening symptoms.   Please return if you have any other emergent concerns.  Additional Information:  Your vital signs today were: BP 157/99   Pulse 95   Temp(Src) 98.5 F (36.9 C) (Oral)   Resp 20   Ht 5\' 7"  (1.702 m)   Wt 275 lb (124.739 kg)   BMI 43.06 kg/m2   SpO2 100%   LMP 06/15/2013 If your blood pressure (BP) was elevated above 135/85 this visit, please have this repeated by your doctor within one month. --------------

## 2013-08-26 NOTE — ED Provider Notes (Signed)
CSN: [id number]     Arrival date & time 08/26/13  2225 History  This chart was scribed for non-physician provider Renne CriglerJoshua Rainie Crenshaw, PA-C, working with Derwood KaplanAnkit Nanavati, MD by Phillis HaggisGabriella Gaje, ED Scribe. This patient was seen in room WA25/WA25 and patient care was started at 11:25 PM.   Chief Complaint  Patient presents with  . Arm Pain   The history is provided by the patient. No language interpreter was used.   HPI Comments: Mindy Wade is a 33 y.o. female who presents to the Emergency Department complaining of shooting left forearm pain onset earlier today. She states that she was using hedge clippers yesterday and the pain started after. She was using hedge clippers for about 20 minutes. She reports associated weakness and numbness radiating up and down her left forearm to her wrist. She states that she has been taking ibuprofen for the pain. She states that the pain was severe enough that she could not lift her 585 month old child. She states that she works at the [employer].   Past Medical History  Diagnosis Date  . Medical history non-contributory   . Hx of varicella   . Breast feeding status of mother    Past Surgical History  Procedure Laterality Date  . No past surgeries     Family History  Problem Relation Age of Onset  . Asthma Mother   . Heart disease Mother   . Hypertension Mother   . Stroke Mother   . Cancer Father     prostate  . Alcohol abuse Neg Hx   . Arthritis Neg Hx   . Birth defects Neg Hx   . COPD Neg Hx   . Depression Neg Hx   . Diabetes Neg Hx   . Drug abuse Neg Hx   . Early death Neg Hx   . Hearing loss Neg Hx   . Hyperlipidemia Neg Hx   . Kidney disease Neg Hx   . Learning disabilities Neg Hx   . Mental illness Neg Hx   . Mental retardation Neg Hx   . Miscarriages / Stillbirths Neg Hx   . Vision loss Neg Hx   . Varicose Veins Neg Hx    History  Substance Use Topics  . Smoking status: Never Smoker   . Smokeless tobacco: Never Used  . Alcohol  Use: No   OB History   Grav Para Term Preterm Abortions TAB SAB Ect Mult Living   5 5 5       5      Review of Systems  Constitutional: Positive for activity change.  Musculoskeletal: Positive for joint swelling and myalgias. Negative for back pain and neck pain.  Skin: Negative for wound.  Neurological: Negative for weakness and numbness.    Allergies  Penicillins  Home Medications   Prior to Admission medications   Medication Sig Start Date End Date Taking? Authorizing Provider  ibuprofen (ADVIL,MOTRIN) 200 MG tablet Take 400 mg by mouth every 6 (six) hours as needed for moderate pain.    Historical Provider, MD   BP 157/99  Pulse 95  Temp(Src) 98.5 F (36.9 C) (Oral)  Resp 20  Ht 5\' 7"  (1.702 m)  Wt 275 lb (124.739 kg)  BMI 43.06 kg/m2  SpO2 100%  LMP 06/15/2013  Physical Exam  Nursing note and vitals reviewed. Constitutional: She appears well-developed and well-nourished.  HENT:  Head: Normocephalic and atraumatic.  Eyes: EOM are normal. Pupils are equal, round, and reactive to light.  Neck: Normal range of motion. Neck supple.  Cardiovascular: Normal rate.  Exam reveals no decreased pulses.   Pulmonary/Chest: Effort normal.  Musculoskeletal: Normal range of motion. She exhibits tenderness. She exhibits no edema.       Left elbow: Normal.       Left wrist: Normal.       Left forearm: She exhibits tenderness. She exhibits no bony tenderness and no swelling.       Left hand: Normal.  Patient with generalized L forearm tenderness, worse with hyperextension of wrists. Compartments are soft. No skin infections noted. Normal capillary refill of left hand.  Neurological: She is alert. No sensory deficit.  Motor, sensation, and vascular distal to the injury is fully intact.   Skin: Skin is warm and dry.  Psychiatric: She has a normal mood and affect. Her behavior is normal.    ED Course  Procedures (including critical care time) DIAGNOSTIC STUDIES: Oxygen  Saturation is 100% on room air, normal by my interpretation.    COORDINATION OF CARE: 11:28 PM-Discussed treatment plan which includes anti-inflammatories and pain medication with pt at bedside and pt agreed to plan.   Labs Review Labs Reviewed - No data to display  Imaging Review No results found.   EKG Interpretation None      Vital signs reviewed and are as follows: Filed Vitals:   08/26/13 2235  BP: 157/99  Pulse: 95  Temp: 98.5 F (36.9 C)  Resp: 20   Patient was counseled on RICE protocol and told to rest injury, use ice for no longer than 15 minutes every hour, compress the area, and elevate above the level of their heart as much as possible to reduce swelling. Questions answered. Patient verbalized understanding.     MDM   Final diagnoses:  Pain of left forearm  Muscle strain   Patient with left forearm pain after using a piece of equipment. No signs of compartment syndrome. Hand is neurovascularly intact. Will treat injury with conservative measures.   I personally performed the services described in this documentation, which was scribed in my presence. The recorded information has been reviewed and is accurate.    Renne CriglerJoshua Audryna Wendt, PA-C 08/27/13 [phone]

## 2013-08-26 NOTE — ED Notes (Signed)
Pt states she was using hedge clippers yesterday and is having severe pain and weakness in her hand today.

## 2013-08-27 NOTE — ED Provider Notes (Signed)
Medical screening examination/treatment/procedure(s) were performed by non-physician practitioner and as supervising physician I was immediately available for consultation/collaboration.   EKG Interpretation None       Willmer Fellers, MD 08/27/13 0220 

## 2013-10-09 ENCOUNTER — Inpatient Hospital Stay (HOSPITAL_COMMUNITY): Payer: Medicaid Other

## 2013-10-09 ENCOUNTER — Inpatient Hospital Stay (HOSPITAL_COMMUNITY)
Admission: AD | Admit: 2013-10-09 | Discharge: 2013-10-10 | DRG: 775 | Disposition: A | Discharge status: Home / Self Care | Payer: Medicaid Other | Source: Ambulatory Visit | Attending: Obstetrics and Gynecology | Admitting: Obstetrics and Gynecology

## 2013-10-09 ENCOUNTER — Encounter (HOSPITAL_COMMUNITY): Payer: Self-pay | Admitting: *Deleted

## 2013-10-09 DIAGNOSIS — O0930 Supervision of pregnancy with insufficient antenatal care, unspecified trimester: Secondary | ICD-10-CM

## 2013-10-09 DIAGNOSIS — Z8249 Family history of ischemic heart disease and other diseases of the circulatory system: Secondary | ICD-10-CM

## 2013-10-09 DIAGNOSIS — Z823 Family history of stroke: Secondary | ICD-10-CM | POA: Diagnosis not present

## 2013-10-09 DIAGNOSIS — O9989 Other specified diseases and conditions complicating pregnancy, childbirth and the puerperium: Principal | ICD-10-CM

## 2013-10-09 DIAGNOSIS — O3432 Maternal care for cervical incompetence, second trimester: Secondary | ICD-10-CM

## 2013-10-09 DIAGNOSIS — O039 Complete or unspecified spontaneous abortion without complication: Secondary | ICD-10-CM | POA: Diagnosis present

## 2013-10-09 DIAGNOSIS — Z825 Family history of asthma and other chronic lower respiratory diseases: Secondary | ICD-10-CM | POA: Diagnosis not present

## 2013-10-09 DIAGNOSIS — O99892 Other specified diseases and conditions complicating childbirth: Principal | ICD-10-CM | POA: Diagnosis present

## 2013-10-09 DIAGNOSIS — Z3689 Encounter for other specified antenatal screening: Secondary | ICD-10-CM

## 2013-10-09 DIAGNOSIS — Z22330 Carrier of Group B streptococcus: Secondary | ICD-10-CM | POA: Diagnosis not present

## 2013-10-09 DIAGNOSIS — R109 Unspecified abdominal pain: Secondary | ICD-10-CM | POA: Diagnosis present

## 2013-10-09 HISTORY — DX: Essential (primary) hypertension: I10

## 2013-10-09 HISTORY — DX: Gestational (pregnancy-induced) hypertension without significant proteinuria, unspecified trimester: O13.9

## 2013-10-09 LAB — RAPID URINE DRUG SCREEN, HOSP PERFORMED
AMPHETAMINES: NOT DETECTED
BARBITURATES: NOT DETECTED
Benzodiazepines: NOT DETECTED
COCAINE: NOT DETECTED
Opiates: NOT DETECTED
TETRAHYDROCANNABINOL: NOT DETECTED

## 2013-10-09 LAB — URINE MICROSCOPIC-ADD ON

## 2013-10-09 LAB — HIV ANTIBODY (ROUTINE TESTING W REFLEX): HIV 1&2 Ab, 4th Generation: NONREACTIVE

## 2013-10-09 LAB — URINALYSIS, ROUTINE W REFLEX MICROSCOPIC
BILIRUBIN URINE: NEGATIVE
Glucose, UA: NEGATIVE mg/dL
KETONES UR: NEGATIVE mg/dL
Nitrite: NEGATIVE
Protein, ur: NEGATIVE mg/dL
Specific Gravity, Urine: <1.005 — ABNORMAL LOW (ref 1.005–1.030)
UROBILINOGEN UA: 0.2 mg/dL (ref 0.0–1.0)
pH: 7 (ref 5.0–8.0)

## 2013-10-09 LAB — CBC
HCT: 32.9 % — ABNORMAL LOW (ref 36.0–46.0)
Hemoglobin: 11.2 g/dL — ABNORMAL LOW (ref 12.0–15.0)
MCH: 29.9 pg (ref 26.0–34.0)
MCHC: 34 g/dL (ref 30.0–36.0)
MCV: 88 fL (ref 78.0–100.0)
Platelets: 264 10*3/uL (ref 150–400)
RBC: 3.74 MIL/uL — ABNORMAL LOW (ref 3.87–5.11)
RDW: 14.4 % (ref 11.5–15.5)
WBC: 8.7 10*3/uL (ref 4.0–10.5)

## 2013-10-09 LAB — RPR

## 2013-10-09 LAB — HEPATITIS C ANTIBODY: HCV Ab: NEGATIVE

## 2013-10-09 MED ORDER — DIPHENHYDRAMINE HCL 25 MG PO CAPS: 25.0000 mg | ORAL_CAPSULE | Freq: Four times a day (QID) | ORAL | Status: DC | PRN

## 2013-10-09 MED ORDER — IBUPROFEN 600 MG PO TABS
600.0000 mg | ORAL_TABLET | Freq: Four times a day (QID) | ORAL | Status: DC | PRN
  Administered 2013-10-09: 600 mg via ORAL
  Filled 2013-10-09: qty 1

## 2013-10-09 MED ORDER — OXYCODONE-ACETAMINOPHEN 5-325 MG PO TABS
1.0000 | ORAL_TABLET | ORAL | Status: DC | PRN
  Administered 2013-10-09: 1 via ORAL
  Filled 2013-10-09: qty 1

## 2013-10-09 MED ORDER — LACTATED RINGERS IV SOLN
INTRAVENOUS | Status: DC
  Administered 2013-10-09: 11:00:00 via INTRAVENOUS

## 2013-10-09 MED ORDER — ONDANSETRON HCL 4 MG/2ML IJ SOLN: 4.0000 mg | Freq: Four times a day (QID) | INTRAMUSCULAR | Status: DC | PRN

## 2013-10-09 MED ORDER — ONDANSETRON HCL 4 MG PO TABS: 4.0000 mg | ORAL_TABLET | ORAL | Status: DC | PRN

## 2013-10-09 MED ORDER — OXYTOCIN BOLUS FROM INFUSION
500.0000 mL | INTRAVENOUS | Status: DC
  Administered 2013-10-09: 500 mL via INTRAVENOUS

## 2013-10-09 MED ORDER — LIDOCAINE HCL (PF) 1 % IJ SOLN
30.0000 mL | INTRAMUSCULAR | Status: DC | PRN
  Filled 2013-10-09: qty 30

## 2013-10-09 MED ORDER — ZOLPIDEM TARTRATE 5 MG PO TABS: 5.0000 mg | ORAL_TABLET | Freq: Every evening | ORAL | Status: DC | PRN

## 2013-10-09 MED ORDER — PRENATAL MULTIVITAMIN CH: 1.0000 | ORAL_TABLET | Freq: Every day | ORAL | Status: DC

## 2013-10-09 MED ORDER — OXYCODONE-ACETAMINOPHEN 5-325 MG PO TABS
1.0000 | ORAL_TABLET | ORAL | Status: DC | PRN
  Administered 2013-10-10: 1 via ORAL
  Filled 2013-10-09 (×2): qty 1

## 2013-10-09 MED ORDER — CITRIC ACID-SODIUM CITRATE 334-500 MG/5ML PO SOLN: 30.0000 mL | ORAL | Status: DC | PRN

## 2013-10-09 MED ORDER — OXYTOCIN 40 UNITS IN LACTATED RINGERS INFUSION - SIMPLE MED
62.5000 mL/h | INTRAVENOUS | Status: DC
  Filled 2013-10-09: qty 1000

## 2013-10-09 MED ORDER — BUTORPHANOL TARTRATE 1 MG/ML IJ SOLN
1.0000 mg | INTRAMUSCULAR | Status: DC | PRN
  Administered 2013-10-09: 1 mg via INTRAVENOUS
  Filled 2013-10-09: qty 1

## 2013-10-09 MED ORDER — IBUPROFEN 600 MG PO TABS
600.0000 mg | ORAL_TABLET | Freq: Four times a day (QID) | ORAL | Status: DC
  Administered 2013-10-09 – 2013-10-10 (×2): 600 mg via ORAL
  Filled 2013-10-09 (×2): qty 1

## 2013-10-09 MED ORDER — DIBUCAINE 1 % RE OINT: 1.0000 "application " | TOPICAL_OINTMENT | RECTAL | Status: DC | PRN

## 2013-10-09 MED ORDER — WITCH HAZEL-GLYCERIN EX PADS: 1.0000 "application " | MEDICATED_PAD | CUTANEOUS | Status: DC | PRN

## 2013-10-09 MED ORDER — ACETAMINOPHEN 325 MG PO TABS: 650.0000 mg | ORAL_TABLET | ORAL | Status: DC | PRN

## 2013-10-09 MED ORDER — SENNOSIDES-DOCUSATE SODIUM 8.6-50 MG PO TABS
2.0000 | ORAL_TABLET | ORAL | Status: DC
  Administered 2013-10-09: 2 via ORAL
  Filled 2013-10-09: qty 2

## 2013-10-09 MED ORDER — SIMETHICONE 80 MG PO CHEW: 80.0000 mg | CHEWABLE_TABLET | ORAL | Status: DC | PRN

## 2013-10-09 MED ORDER — ONDANSETRON HCL 4 MG/2ML IJ SOLN: 4.0000 mg | INTRAMUSCULAR | Status: DC | PRN

## 2013-10-09 MED ORDER — LACTATED RINGERS IV SOLN: 500.0000 mL | INTRAVENOUS | Status: DC | PRN

## 2013-10-09 MED ORDER — PROMETHAZINE HCL 25 MG/ML IJ SOLN
12.5000 mg | Freq: Four times a day (QID) | INTRAMUSCULAR | Status: DC | PRN
  Administered 2013-10-09: 12.5 mg via INTRAVENOUS
  Filled 2013-10-09: qty 1

## 2013-10-09 MED ORDER — LACTATED RINGERS IV SOLN: INTRAVENOUS | Status: DC

## 2013-10-09 NOTE — H&P (Addendum)
Mindy Wade is a 33 y.o. female [phone] with no prenatal care who presented to [facility]  this AM with painful cramping that had started several hours prior to coming in and found to be pregnant.  She is well known to our practice and just delivered a healthy female infant 02/2013.  She had one cycle after delivery and then no more, but had not initiated prenatal care prior to today.  On arrival given that she had no care, she had an US that showed a baby estimated at 7521 weeks gestational age and the cervix open with bag bulging down into vagina.  She was admitted to expectant care, but contuned to cramp, and within one hour of admission felt the baby coming.   Maternal Medical History:  Reason for admission: Contractions.   Contractions: Onset was 6-12 hours ago.   Frequency: regular.    Fetal activity: Perceived fetal activity is normal.      OB History   Grav Para Term Preterm Abortions TAB SAB Ect Mult Living   6 5 5       5     NSVD x 5 (5# to 8+#)  Past Medical History  Diagnosis Date  . Hx of varicella   . Breast feeding status of mother   . Hypertension   . Pregnancy induced hypertension    Past Surgical History  Procedure Laterality Date  . No past surgeries     Family History: family history includes Asthma in her mother; Cancer in her father; Heart disease in her mother; Hypertension in her mother; Stroke in her mother. There is no history of Alcohol abuse, Arthritis, Birth defects, COPD, Depression, Diabetes, Drug abuse, Early death, Hearing loss, Hyperlipidemia, Kidney disease, Learning disabilities, Mental illness, Mental retardation, Miscarriages / Stillbirths, Vision loss, or Varicose Veins. Social History:  reports that she has never smoked. She has never used smokeless tobacco. She reports that she does not drink alcohol or use illicit drugs.   Prenatal Transfer Tool  Maternal Diabetes:none known No prenatal care     ROS    Blood pressure 140/92, pulse 83,  temperature 98.3 F (36.8 C), temperature source Axillary, resp. rate 20, height 5\' 7"  (1.702 m), weight 122.471 kg (270 lb), last menstrual period 06/15/2013, currently breastfeeding. Maternal Exam:  Uterine Assessment: Contraction strength is moderate.  Contraction frequency is regular.   Abdomen: Patient reports no abdominal tenderness. Fetal presentation: vertex  Introitus: Normal vulva. Normal vagina.    Physical Exam  Constitutional: She appears well-developed and well-nourished.  Cardiovascular: Normal rate and regular rhythm.   Respiratory: Effort normal.  GI: Soft.  Genitourinary: Vagina normal.  Gravid FH about 20 cm  Psychiatric: She has a normal mood and affect. Her behavior is normal.    Prenatal labs: ABO, Rh: --/--/A POS (08/14 1029) Antibody: POS (08/14 1029)  Anti-E in past Rubella:   RPR: NON REACTIVE (01/05 0815)  HBsAg:    HIV:    GBS: Positive (12/10 0000)   Assessment/Plan: Pt was admitted and within one hour felt pressure to push, please see delivery note.     Oliver PilaRICHARDSON,Lawsen Arnott W 10/09/2013, 12:53 PM

## 2013-10-09 NOTE — Progress Notes (Signed)
10/09/13 1600  Clinical Encounter Type  Visited With Patient and family together Mindy Wade(SO Stefan)  Visit Type Spiritual support;Social support  Referral From Nurse  Spiritual Encounters  Spiritual Needs Grief support;Emotional;Brochure  Stress Factors  Patient Stress Factors Loss  Family Stress Factors Loss   Visited with pt and SO several times this afternoon, providing bereavement support.  Assisted family with questions about funeral home/cremation arrangements, supporting older children through loss, and bereavement photos.  Family was grateful for support from all staff; pt observed, "This is the best care I've gotten, out of all my babies."  Provided pastoral presence, empathic listening, encouragement to practice self-care, and grief education.  Family aware of ongoing chaplain availability as desired.  [address]Chaplain Sentoria Brent TullahasseeLundeen, [place] DakotaMDiv [phone]

## 2013-10-09 NOTE — MAU Note (Signed)
U/S tech called and notified of pt's cervix open, need for bed to transfer back to MAU. Pt in trendelenburg

## 2013-10-09 NOTE — Progress Notes (Signed)
JEthier, PA-C notified of pt in MAU for eval of cramping contractions, unknown gestational age, orders for u/s given.

## 2013-10-09 NOTE — MAU Note (Signed)
Pt states at 0200 began feeling cramping that then became more consistently 4-5 minutes apart. Denies bleeding or lof. Had 6 week check up in February post partum, written rx for birth control, missed cycle in March. Has not been to MD office with this pregnancy.

## 2013-10-09 NOTE — Progress Notes (Signed)
Notified of pt in U/S, 21 wks, cervix open, will come see pt.

## 2013-10-09 NOTE — Discharge Summary (Addendum)
Obstetric Discharge Summary Reason for Admission: onset of labor Prenatal Procedures: ultrasound Intrapartum Procedures: spontaneous vaginal delivery Postpartum Procedures: none Complications-Operative and Postpartum: severe prematurity with neonatal demise Hemoglobin  Date Value Ref Range Status  10/09/2013 11.2* 12.0 - 15.0 g/dL Final     HCT  Date Value Ref Range Status  10/09/2013 32.9* 36.0 - 46.0 % Final    Physical Exam:  General: alert and cooperative Lochia: appropriate Uterine Fundus: firm   Discharge Diagnoses: Preterm delivery at 21 weeks estimated gestational age                                        No prenatal care  Discharge Information: Date: 10/09/2013 Activity: pelvic rest Diet: routine Medications: Ibuprofen and Percocet Condition: stable and improved Instructions: refer to practice specific booklet Discharge to: home Follow-up Information   Follow up with [name],Jaasiel Hollyfield W, MD. Schedule an appointment as soon as possible for a visit in 2 weeks.   Specialty:  Obstetrics and Gynecology   Contact information:   [address] [place] [address] [phone]       Newborn Data: Live born female  Birth Weight: 13.8 oz (391 g) APGAR: 2, 1 Baby expired after birth with severe prematurity, eyes fused, no resuscitation indicated  Evertte Sones W 10/09/2013, 8:57 PM

## 2013-10-09 NOTE — Progress Notes (Signed)
Notified of pt's cervix open per US, [redacted] wk gestation, will admit pt, labs ordered, [facility]

## 2013-10-09 NOTE — MAU Note (Signed)
Asked pt her due date: " I don't know, haven't been to the dr yet"

## 2013-10-09 NOTE — MAU Note (Signed)
Pains woke her at 0200. Getting stronger and regular, now every 4-5 min. No bleeding or leaking. Denies hx of PTL

## 2013-10-09 NOTE — Progress Notes (Signed)
Weekend chaplain paged at 1738, 09 October 2013. Page call back got patient in her room. Patient received different information from day time chaplain and unit staff on making funeral arrangements. [facility]. Chaplain Lundeen called patient and gave clarification to her questions. Mindy CritchleyChaplain Lundeen will consult staff next week to deal with the aftermath of the patient's questions.   Should other questions arise from patient page the weekend chaplain at [phone] and he will respond immediately to any further needs or questions.  Mindy Wade, DMin Chaplain

## 2013-10-10 LAB — GC/CHLAMYDIA PROBE AMP
CT Probe RNA: NEGATIVE
GC Probe RNA: NEGATIVE

## 2013-10-10 MED ORDER — OXYCODONE-ACETAMINOPHEN 5-325 MG PO TABS: 1.0000 | ORAL_TABLET | ORAL | Status: DC | PRN

## 2013-10-10 MED ORDER — IBUPROFEN 600 MG PO TABS: 600.0000 mg | ORAL_TABLET | Freq: Four times a day (QID) | ORAL | Status: DC

## 2013-10-10 NOTE — Progress Notes (Signed)
Patient ID: Mindy Wade, female   DOB: 1980-07-25, 33 y.o.   MRN: [id number] S/p 21 week NSVD/fetal loss Pt slept ok, feels she has good support for coping  Bleeding and cramping minimal  afeb BP normal Fundus firm  Emotional support and information regarding Heartstrings d/w patient Will see pt in office in 2 weeks, advised pelvic rest Unclear etiology of loss/PTL  D/w pt early prenatal care in any future pregnancies to watch cervix closely and plan 17-P

## 2013-10-12 LAB — TYPE AND SCREEN
ABO/RH(D): A POS
Antibody Screen: POSITIVE
DAT, IGG: NEGATIVE
Donor AG Type: NEGATIVE
Donor AG Type: NEGATIVE
UNIT DIVISION: 0
Unit division: 0

## 2013-10-16 ENCOUNTER — Inpatient Hospital Stay (HOSPITAL_COMMUNITY)
Admission: EM | Admit: 2013-10-16 | Discharge: 2013-10-18 | DRG: 176 | Disposition: A | Discharge status: Home / Self Care | Payer: Medicaid Other | Attending: Internal Medicine | Admitting: Internal Medicine

## 2013-10-16 ENCOUNTER — Emergency Department (HOSPITAL_COMMUNITY): Payer: Medicaid Other

## 2013-10-16 ENCOUNTER — Encounter (HOSPITAL_COMMUNITY): Payer: Self-pay | Admitting: Emergency Medicine

## 2013-10-16 DIAGNOSIS — I2699 Other pulmonary embolism without acute cor pulmonale: Principal | ICD-10-CM | POA: Diagnosis present

## 2013-10-16 DIAGNOSIS — I10 Essential (primary) hypertension: Secondary | ICD-10-CM | POA: Diagnosis present

## 2013-10-16 DIAGNOSIS — O94 Sequelae of complication of pregnancy, childbirth, and the puerperium: Secondary | ICD-10-CM

## 2013-10-16 LAB — BASIC METABOLIC PANEL
ANION GAP: 13 (ref 5–15)
BUN: 11 mg/dL (ref 6–23)
CHLORIDE: 105 meq/L (ref 96–112)
CO2: 23 meq/L (ref 19–32)
Calcium: 9 mg/dL (ref 8.4–10.5)
Creatinine, Ser: 0.65 mg/dL (ref 0.50–1.10)
GFR calc non Af Amer: >90 mL/min (ref >90)
Glucose, Bld: 91 mg/dL (ref 70–99)
Potassium: 3.9 mEq/L (ref 3.7–5.3)
SODIUM: 141 meq/L (ref 137–147)

## 2013-10-16 LAB — CBC
HCT: 34.9 % — ABNORMAL LOW (ref 36.0–46.0)
Hemoglobin: 11.4 g/dL — ABNORMAL LOW (ref 12.0–15.0)
MCH: 28.9 pg (ref 26.0–34.0)
MCHC: 32.7 g/dL (ref 30.0–36.0)
MCV: 88.4 fL (ref 78.0–100.0)
PLATELETS: 308 10*3/uL (ref 150–400)
RBC: 3.95 MIL/uL (ref 3.87–5.11)
RDW: 14.2 % (ref 11.5–15.5)
WBC: 5.8 10*3/uL (ref 4.0–10.5)

## 2013-10-16 LAB — I-STAT TROPONIN, ED: TROPONIN I, POC: 0 ng/mL (ref 0.00–0.08)

## 2013-10-16 NOTE — ED Notes (Addendum)
Pt reports generalized chest pain, nonradiating since 8pm. Started after exertion at work. Shob, speaking in short sentences. Denies n/v. Reports some dizziness when walking up stairs. C/o lower back pain. Hx HTN. Recent vaginal delivery of 21wk premature infant, one week ago. Chest pain worse with deep breath. Denies Hx blood clots.

## 2013-10-17 ENCOUNTER — Emergency Department (HOSPITAL_COMMUNITY): Payer: Medicaid Other

## 2013-10-17 ENCOUNTER — Encounter (HOSPITAL_COMMUNITY): Payer: Self-pay | Admitting: Internal Medicine

## 2013-10-17 DIAGNOSIS — I10 Essential (primary) hypertension: Secondary | ICD-10-CM | POA: Diagnosis present

## 2013-10-17 DIAGNOSIS — O94 Sequelae of complication of pregnancy, childbirth, and the puerperium: Secondary | ICD-10-CM | POA: Diagnosis not present

## 2013-10-17 DIAGNOSIS — I369 Nonrheumatic tricuspid valve disorder, unspecified: Secondary | ICD-10-CM

## 2013-10-17 DIAGNOSIS — I2699 Other pulmonary embolism without acute cor pulmonale: Principal | ICD-10-CM | POA: Diagnosis present

## 2013-10-17 LAB — HEPARIN LEVEL (UNFRACTIONATED)
HEPARIN UNFRACTIONATED: 0.35 [IU]/mL (ref 0.30–0.70)
HEPARIN UNFRACTIONATED: 0.24 [IU]/mL — AB (ref 0.30–0.70)

## 2013-10-17 LAB — COMPREHENSIVE METABOLIC PANEL
ALBUMIN: 2.9 g/dL — AB (ref 3.5–5.2)
ALK PHOS: 72 U/L (ref 39–117)
ALT: 8 U/L (ref 0–35)
ANION GAP: 13 (ref 5–15)
AST: 11 U/L (ref 0–37)
BUN: 9 mg/dL (ref 6–23)
CHLORIDE: 104 meq/L (ref 96–112)
CO2: 24 meq/L (ref 19–32)
Calcium: 8.7 mg/dL (ref 8.4–10.5)
Creatinine, Ser: 0.61 mg/dL (ref 0.50–1.10)
GFR calc Af Amer: >90 mL/min (ref >90)
Glucose, Bld: 101 mg/dL — ABNORMAL HIGH (ref 70–99)
POTASSIUM: 3.4 meq/L — AB (ref 3.7–5.3)
Sodium: 141 mEq/L (ref 137–147)
Total Protein: 7.1 g/dL (ref 6.0–8.3)

## 2013-10-17 LAB — PROTIME-INR
INR: 1.09 (ref 0.00–1.49)
PROTHROMBIN TIME: 14.1 s (ref 11.6–15.2)

## 2013-10-17 LAB — CBC
HEMATOCRIT: 32.5 % — AB (ref 36.0–46.0)
Hemoglobin: 10.5 g/dL — ABNORMAL LOW (ref 12.0–15.0)
MCH: 28.7 pg (ref 26.0–34.0)
MCHC: 32.3 g/dL (ref 30.0–36.0)
MCV: 88.8 fL (ref 78.0–100.0)
Platelets: 290 10*3/uL (ref 150–400)
RBC: 3.66 MIL/uL — ABNORMAL LOW (ref 3.87–5.11)
RDW: 14.4 % (ref 11.5–15.5)
WBC: 7.2 10*3/uL (ref 4.0–10.5)

## 2013-10-17 LAB — MAGNESIUM: Magnesium: 1.8 mg/dL (ref 1.5–2.5)

## 2013-10-17 LAB — TROPONIN I
Troponin I: <0.3 ng/mL (ref <0.30)
Troponin I: <0.3 ng/mL (ref <0.30)
Troponin I: <0.3 ng/mL (ref <0.30)

## 2013-10-17 LAB — ANTITHROMBIN III: AntiThromb III Func: 78 % (ref 75–120)

## 2013-10-17 LAB — D-DIMER, QUANTITATIVE (NOT AT ARMC): D DIMER QUANT: 1.85 ug{FEU}/mL — AB (ref 0.00–0.48)

## 2013-10-17 LAB — TSH: TSH: 1.02 u[IU]/mL (ref 0.350–4.500)

## 2013-10-17 LAB — PHOSPHORUS: Phosphorus: 3.3 mg/dL (ref 2.3–4.6)

## 2013-10-17 MED ORDER — ACETAMINOPHEN 650 MG RE SUPP: 650.0000 mg | Freq: Four times a day (QID) | RECTAL | Status: DC | PRN

## 2013-10-17 MED ORDER — HEPARIN (PORCINE) IN NACL 100-0.45 UNIT/ML-% IJ SOLN
1700.0000 [IU]/h | INTRAMUSCULAR | Status: DC
  Administered 2013-10-17 (×2): 1700 [IU]/h via INTRAVENOUS
  Filled 2013-10-17 (×2): qty 250

## 2013-10-17 MED ORDER — POLYETHYLENE GLYCOL 3350 17 G PO PACK
17.0000 g | PACK | Freq: Every day | ORAL | Status: DC | PRN
  Filled 2013-10-17 (×2): qty 1

## 2013-10-17 MED ORDER — OXYCODONE-ACETAMINOPHEN 5-325 MG PO TABS
1.0000 | ORAL_TABLET | ORAL | Status: DC | PRN
  Administered 2013-10-18: 2 via ORAL
  Filled 2013-10-17: qty 2

## 2013-10-17 MED ORDER — NITROGLYCERIN 2 % TD OINT
1.0000 [in_us] | TOPICAL_OINTMENT | Freq: Once | TRANSDERMAL | Status: AC
  Administered 2013-10-17: 1 [in_us] via TOPICAL
  Filled 2013-10-17: qty 30

## 2013-10-17 MED ORDER — ONDANSETRON HCL 4 MG PO TABS: 4.0000 mg | ORAL_TABLET | Freq: Four times a day (QID) | ORAL | Status: DC | PRN

## 2013-10-17 MED ORDER — ONDANSETRON HCL 4 MG/2ML IJ SOLN: 4.0000 mg | Freq: Four times a day (QID) | INTRAMUSCULAR | Status: DC | PRN

## 2013-10-17 MED ORDER — DOCUSATE SODIUM 100 MG PO CAPS
100.0000 mg | ORAL_CAPSULE | Freq: Two times a day (BID) | ORAL | Status: DC
  Administered 2013-10-17 – 2013-10-18 (×3): 100 mg via ORAL
  Filled 2013-10-17 (×4): qty 1

## 2013-10-17 MED ORDER — WARFARIN SODIUM 10 MG PO TABS
10.0000 mg | ORAL_TABLET | Freq: Once | ORAL | Status: AC
  Administered 2013-10-17: 10 mg via ORAL
  Filled 2013-10-17: qty 1

## 2013-10-17 MED ORDER — WARFARIN - PHARMACIST DOSING INPATIENT: Freq: Every day | Status: DC

## 2013-10-17 MED ORDER — HEPARIN (PORCINE) IN NACL 100-0.45 UNIT/ML-% IJ SOLN
2000.0000 [IU]/h | INTRAMUSCULAR | Status: DC
  Administered 2013-10-17: 2000 [IU]/h via INTRAVENOUS
  Filled 2013-10-17 (×31): qty 250

## 2013-10-17 MED ORDER — ASPIRIN 81 MG PO CHEW
324.0000 mg | CHEWABLE_TABLET | Freq: Once | ORAL | Status: AC
  Administered 2013-10-17: 324 mg via ORAL
  Filled 2013-10-17: qty 4

## 2013-10-17 MED ORDER — ACETAMINOPHEN 325 MG PO TABS
650.0000 mg | ORAL_TABLET | Freq: Four times a day (QID) | ORAL | Status: DC | PRN
  Administered 2013-10-17: 650 mg via ORAL
  Filled 2013-10-17: qty 2

## 2013-10-17 MED ORDER — HEPARIN BOLUS VIA INFUSION
3000.0000 [IU] | Freq: Once | INTRAVENOUS | Status: AC
  Administered 2013-10-17: 3000 [IU] via INTRAVENOUS
  Filled 2013-10-17: qty 3000

## 2013-10-17 MED ORDER — SODIUM CHLORIDE 0.9 % IV SOLN
INTRAVENOUS | Status: AC
  Administered 2013-10-17: 06:00:00 via INTRAVENOUS

## 2013-10-17 MED ORDER — HYDROCODONE-ACETAMINOPHEN 5-325 MG PO TABS
1.0000 | ORAL_TABLET | ORAL | Status: DC | PRN
  Administered 2013-10-17: 1 via ORAL
  Administered 2013-10-17: 2 via ORAL
  Administered 2013-10-17: 1 via ORAL
  Filled 2013-10-17: qty 1
  Filled 2013-10-17: qty 2
  Filled 2013-10-17: qty 1

## 2013-10-17 MED ORDER — WARFARIN VIDEO: Freq: Once | Status: DC

## 2013-10-17 MED ORDER — POTASSIUM CHLORIDE CRYS ER 20 MEQ PO TBCR
40.0000 meq | EXTENDED_RELEASE_TABLET | Freq: Once | ORAL | Status: AC
  Administered 2013-10-17: 40 meq via ORAL
  Filled 2013-10-17: qty 2

## 2013-10-17 MED ORDER — HYDRALAZINE HCL 20 MG/ML IJ SOLN
5.0000 mg | Freq: Once | INTRAMUSCULAR | Status: AC
  Administered 2013-10-17: 5 mg via INTRAVENOUS
  Filled 2013-10-17: qty 1

## 2013-10-17 MED ORDER — PATIENT'S GUIDE TO USING COUMADIN BOOK
Freq: Once | Status: AC
  Administered 2013-10-17: 10:00:00
  Filled 2013-10-17: qty 1

## 2013-10-17 MED ORDER — IOHEXOL 350 MG/ML SOLN
100.0000 mL | Freq: Once | INTRAVENOUS | Status: AC | PRN
  Administered 2013-10-17: 100 mL via INTRAVENOUS

## 2013-10-17 MED ORDER — SODIUM CHLORIDE 0.9 % IJ SOLN
3.0000 mL | Freq: Two times a day (BID) | INTRAMUSCULAR | Status: DC
  Administered 2013-10-17 (×2): 3 mL via INTRAVENOUS

## 2013-10-17 NOTE — Progress Notes (Signed)
Pt transported from ED via stretcher and ambulated to bed with little assistance. Pt denies chest pain at this time. VSS. Will continue to monitor pt. Mindy Wade, Lis Savitt I

## 2013-10-17 NOTE — Progress Notes (Signed)
ANTICOAGULATION CONSULT NOTE - Follow up  Pharmacy Consult for Heparin Indication: pulmonary embolus  Allergies  Allergen Reactions  . Penicillins Anaphylaxis    Patient Measurements: Height: 5\' 7"  (170.2 cm) Weight: 266 lb 14.4 oz (121.065 kg) IBW/kg (Calculated) : 61.6 Heparin Dosing Weight:   Vital Signs: Temp: 98.3 F (36.8 C) (08/22 1402) Temp src: Oral (08/22 1402) BP: 141/88 mmHg (08/22 1402) Pulse Rate: 92 (08/22 1402)  Labs:  Recent Labs  10/16/13 2322 10/17/13 0705 10/17/13 1152 10/17/13 1810  HGB 11.4* 10.5*  --   --   HCT 34.9* 32.5*  --   --   PLT 308 290  --   --   LABPROT  --  14.1  --   --   INR  --  1.09  --   --   HEPARINUNFRC  --   --  0.35 0.24*  CREATININE 0.65 0.61  --   --   TROPONINI  --  <0.30 <0.30 <0.30    Estimated Creatinine Clearance: 133.6 ml/min (by C-G formula based on Cr of 0.61).   Medical History: Past Medical History  Diagnosis Date  . Hx of varicella   . Breast feeding status of mother   . Hypertension   . Pregnancy induced hypertension     Medications:  Infusions:  . heparin 1,700 Units/hr (10/17/13 1736)    ASSESSMENT: 33yo F one week post-partum(baby did not survive), presents with SOB and CP. CT angiogram confirmed PE. Pharmacy was asked to start heparin and Coumadin. Baseline INR is wnl. Coumadin dose predictor points = 10. Will need 5 day overlap of heparin and Coumadin and INR  2 or greater. Could consider switching heparin to Lovenox when stable for discharge. Recommend Lovenox 120mg  sq q12h.  Significant events/clinical course: 8/22: First HL is in therapeutic range. No bleeding reported/documented. Dr. Arthor CaptainElmahi may discharge tomorrow on Eliquis. PM HL = 0.24 subtherapeutic - Increase rate from 1700 to 2000 units/hr  Goal of Therapy:  INR 2-3 Heparin level 0.3-0.7 units/ml Monitor platelets by anticoagulation protocol: Yes   Plan:   Increase heparin rate to 2000 units/hr  Recheck heparin level 6  hours after rate increase.  Daily heparin level, INR, and CBC.  F/u discharge anticoag plan.  Hessie KnowsJustin M Radhika Dershem, PharmD, BCPS Pager [phone] 10/17/2013 6:52 PM

## 2013-10-17 NOTE — Progress Notes (Signed)
ANTICOAGULATION CONSULT NOTE - Follow up  Pharmacy Consult for Heparin, warfarin Indication: pulmonary embolus  Allergies  Allergen Reactions  . Penicillins Anaphylaxis    Patient Measurements: Height: 5\' 7"  (170.2 cm) Weight: 266 lb 14.4 oz (121.065 kg) IBW/kg (Calculated) : 61.6 Heparin Dosing Weight:   Vital Signs: Temp: 98.2 F (36.8 C) (08/22 0520) Temp src: Oral (08/22 0520) BP: 128/89 mmHg (08/22 0520) Pulse Rate: 83 (08/22 0520)  Labs:  Recent Labs  10/16/13 2322 10/17/13 0705 10/17/13 1152  HGB 11.4* 10.5*  --   HCT 34.9* 32.5*  --   PLT 308 290  --   LABPROT  --  14.1  --   INR  --  1.09  --   HEPARINUNFRC  --   --  0.35  CREATININE 0.65 0.61  --   TROPONINI  --  <0.30  --     Estimated Creatinine Clearance: 133.6 ml/min (by C-G formula based on Cr of 0.61).   Medical History: Past Medical History  Diagnosis Date  . Hx of varicella   . Breast feeding status of mother   . Hypertension   . Pregnancy induced hypertension     Medications:  Infusions:  . sodium chloride 100 mL/hr at 10/17/13 0548  . heparin 1,700 Units/hr (10/17/13 0555)    ASSESSMENT: 33yo F one week post-partum(baby did not survive), presents with SOB and CP. CT angiogram confirmed PE. Pharmacy was asked to start heparin and Coumadin. Baseline INR is wnl. Coumadin dose predictor points = 10. Will need 5 day overlap of heparin and Coumadin and INR  2 or greater. Could consider switching heparin to Lovenox when stable for discharge. Recommend Lovenox 120mg  sq q12h.  Significant events/clinical course: 8/22: First HL is in therapeutic range. No bleeding reported/documented. Dr. Arthor CaptainElmahi may discharge tomorrow on Eliquis.  Goal of Therapy:  INR 2-3 Heparin level 0.3-0.7 units/ml Monitor platelets by anticoagulation protocol: Yes   Plan:   Cont heparin drip at 1700 units/hr  Check a confirmatory heparin level at 1800.  Daily heparin level, INR, and CBC.  F/u discharge  anticoag plan.  Charolotte Ekeom Wayden Schwertner, PharmD, pager [phone]. 10/17/2013,12:33 PM.

## 2013-10-17 NOTE — Progress Notes (Signed)
[facility] PROGRESS NOTE   Mindy Wade [place] JXB:[id number] DOB: [birth year] DOA: 10/16/2013 PCP: No PCP Per Patient  HPI/Subjective: Complains about some headache, denies chest pain.  Assessment/Plan: Active Problems:   Pulmonary embolism   Pulmonary embolus   Acute pulmonary embolism -Patient admitted to the hospital with chest pain, admitted to telemetry. -Likely develop because of the recent pregnancy/miscarriage, hypercoagulability panel obtained. -Nearly occlusive PE involving the interlobar pulmonary artery, and denies hemoptysis. -Patient started on heparin Gtt, likely will be discharged on Eliquis in the morning.  Recent miscarriage -Patient indicated that she will have more than average vaginal bleeding because of the blood thinner. -She needs close followup with her obstetrician and primary care physician.  Code Status: Full code Family Communication: Plan discussed with the patient. Disposition Plan: Remains inpatient   Consultants:  None  Procedures:  None  Antibiotics:  None   Objective: Filed Vitals:   10/17/13 0520  BP: 128/89  Pulse: 83  Temp: 98.2 F (36.8 C)  Resp: 18    Intake/Output Summary (Last 24 hours) at 10/17/13 1150 Last data filed at 10/17/13 0700  Gross per 24 hour  Intake    260 ml  Output      0 ml  Net    260 ml   Filed Weights   10/17/13 0520  Weight: 121.065 kg (266 lb 14.4 oz)    Exam: General: Alert and awake, oriented x3, not in any acute distress. HEENT: anicteric sclera, pupils reactive to light and accommodation, EOMI CVS: S1-S2 clear, no murmur rubs or gallops Chest: clear to auscultation bilaterally, no wheezing, rales or rhonchi Abdomen: soft nontender, nondistended, normal bowel sounds, no organomegaly Extremities: no cyanosis, clubbing or edema noted bilaterally Neuro: Cranial nerves II-XII intact, no focal neurological deficits  Data Reviewed: Basic Metabolic Panel:  Recent Labs Lab  10/16/13 2322 10/17/13 0705  NA 141 141  K 3.9 3.4*  CL 105 104  CO2 23 24  GLUCOSE 91 101*  BUN 11 9  CREATININE 0.65 0.61  CALCIUM 9.0 8.7  MG  --  1.8  PHOS  --  3.3   Liver Function Tests:  Recent Labs Lab 10/17/13 0705  AST 11  ALT 8  ALKPHOS 72  BILITOT <0.2*  PROT 7.1  ALBUMIN 2.9*   No results found for this basename: LIPASE, AMYLASE,  in the last 168 hours No results found for this basename: AMMONIA,  in the last 168 hours CBC:  Recent Labs Lab 10/16/13 2322 10/17/13 0705  WBC 5.8 7.2  HGB 11.4* 10.5*  HCT 34.9* 32.5*  MCV 88.4 88.8  PLT 308 290   Cardiac Enzymes:  Recent Labs Lab 10/17/13 0705  TROPONINI <0.30   BNP (last 3 results) No results found for this basename: PROBNP,  in the last 8760 hours CBG: No results found for this basename: GLUCAP,  in the last 168 hours  Micro Recent Results (from the past 240 hour(s))  GC/CHLAMYDIA PROBE AMP     Status: None   Collection Time    10/09/13 11:36 AM      Result Value Ref Range Status   CT Probe RNA NEGATIVE  NEGATIVE Final   GC Probe RNA NEGATIVE  NEGATIVE Final   Comment: (NOTE)                                                                                               **  Normal Reference Range: Negative**          Assay performed using the Gen-Probe APTIMA COMBO2 (R) Assay.     Acceptable specimen types for this assay include APTIMA Swabs (Unisex,     endocervical, urethral, or vaginal), first void urine, and ThinPrep     liquid based cytology samples.     Performed at [facility]     Studies: Dg Chest 2 View  10/16/2013   CLINICAL DATA:  Chest pain  EXAM: CHEST  2 VIEW  COMPARISON:  09/25/2010  FINDINGS: Normal heart size and mediastinal contours. No acute infiltrate or edema. No effusion or pneumothorax. No acute osseous findings.  IMPRESSION: No active cardiopulmonary disease.   Electronically Signed   By: Tiburcio PeaJonathan  Watts M.D.   On: 10/16/2013 23:59   Ct Angio Chest Pe  W/cm &/or Wo Cm  10/17/2013   CLINICAL DATA:  Chest pain and shortness of breath  EXAM: CT ANGIOGRAPHY CHEST WITH CONTRAST  TECHNIQUE: Multidetector CT imaging of the chest was performed using the standard protocol during bolus administration of intravenous contrast. Multiplanar CT image reconstructions and MIPs were obtained to evaluate the vascular anatomy.  CONTRAST:  100mL OMNIPAQUE IOHEXOL 350 MG/ML SOLN  COMPARISON:  None.  FINDINGS: THORACIC INLET/BODY WALL:  No acute abnormality.  MEDIASTINUM:  There is a large, nearly occlusive pulmonary embolism to the interlobar pulmonary artery on the right, extending into branches of the right middle and lower lobe segmental pulmonary arteries. No right ventricular enlargement suggestive of strain. No cardiomegaly or pericardial effusion. No lymphadenopathy.  LUNG WINDOWS:  No consolidation.  No effusion.  UPPER ABDOMEN:  No acute findings.  OSSEOUS:  No acute fracture.  Critical Value/emergent results were called by telephone at the time of interpretation on 10/17/2013 at 3:10 am to Dr. Loren RacerAVID YELVERTON , who verbally acknowledged these results.  Review of the MIP images confirms the above findings.  IMPRESSION: Acute pulmonary embolism to the interlobar pulmonary artery, nearly occlusive.   Electronically Signed   By: Tiburcio PeaJonathan  Watts M.D.   On: 10/17/2013 03:12    Scheduled Meds: . docusate sodium  100 mg Oral BID  . sodium chloride  3 mL Intravenous Q12H  . warfarin   Does not apply Once  . Warfarin - Pharmacist Dosing Inpatient   Does not apply q1800   Continuous Infusions: . sodium chloride 100 mL/hr at 10/17/13 0548  . heparin 1,700 Units/hr (10/17/13 0555)       Time spent: 35 minutes    [facility],Taft Worthing A  [facility] Pager [phone] If 7PM-7AM, please contact night-coverage at [email or web address], password [email or web address] 10/17/2013, 11:50 AM  LOS: 1 day

## 2013-10-17 NOTE — H&P (Signed)
PCP:  No PCP Per Patient    Chief Complaint:  Shortness of breath and chest pain  HPI: Mindy Wade is a 33 y.o. female   has a past medical history of varicella; Breast feeding status of mother; Hypertension; and Pregnancy induced hypertension.   Presented with  Patient was recently discovered to be pregnant and delivered baby girl last week at 21 weeks. The Baby was pronounce dead and patient was discharged to home. She has been having shortness of breath and chest pain for the past 1 week. She works for [employer]. No recent trips. She has 5 Older children. She is not currently breast feeding. No prior miscaraged no hx of miscarages in the family. She was not using any birth control. Denies any leg swelling.   Hospitalist was called for admission for pulmonary embolism post partum.   Review of Systems:    Pertinent positives include:  chest pain, shortness of breath at rest.  Constitutional:  No weight loss, night sweats, Fevers, chills, fatigue, weight loss  HEENT:  No headaches, Difficulty swallowing,Tooth/dental problems,Sore throat,  No sneezing, itching, ear ache, nasal congestion, post nasal drip,  Cardio-vascular:  No  Orthopnea, PND, anasarca, dizziness, palpitations.no Bilateral lower extremity swelling  GI:  No heartburn, indigestion, abdominal pain, nausea, vomiting, diarrhea, change in bowel habits, loss of appetite, melena, blood in stool, hematemesis Resp:  no  No dyspnea on exertion, No excess mucus, no productive cough, No non-productive cough, No coughing up of blood.No change in color of mucus.No wheezing. Skin:  no rash or lesions. No jaundice GU:  no dysuria, change in color of urine, no urgency or frequency. No straining to urinate.  No flank pain.  Musculoskeletal:  No joint pain or no joint swelling. No decreased range of motion. No back pain.  Psych:  No change in mood or affect. No depression or anxiety. No memory loss.  Neuro: no  localizing neurological complaints, no tingling, no weakness, no double vision, no gait abnormality, no slurred speech, no confusion  Otherwise ROS are negative except for above, 10 systems were reviewed  Past Medical History: Past Medical History  Diagnosis Date  . Hx of varicella   . Breast feeding status of mother   . Hypertension   . Pregnancy induced hypertension    Past Surgical History  Procedure Laterality Date  . No past surgeries       Medications: Prior to Admission medications   Medication Sig Start Date End Date Taking? Authorizing Provider  ibuprofen (ADVIL,MOTRIN) 600 MG tablet Take 1 tablet (600 mg total) by mouth every 6 (six) hours. 10/10/13  Yes Oliver Pila, MD  Multiple Vitamin (MULTIVITAMIN WITH MINERALS) TABS tablet Take 1 tablet by mouth daily.   Yes Historical Provider, MD  oxyCODONE-acetaminophen (PERCOCET/ROXICET) 5-325 MG per tablet Take 1-2 tablets by mouth every 4 (four) hours as needed for severe pain. 10/10/13  Yes Oliver Pila, MD    Allergies:   Allergies  Allergen Reactions  . Penicillins Anaphylaxis    Social History:  Ambulatory   independently   Lives at home With family     reports that she has never smoked. She has never used smokeless tobacco. She reports that she drinks alcohol. She reports that she does not use illicit drugs.    Family History: family history includes Asthma in her mother; Cancer in her father; Heart disease in her mother; Hypertension in her mother; Stroke in her mother. There is no history of  Alcohol abuse, Arthritis, Birth defects, COPD, Depression, Diabetes, Drug abuse, Early death, Hearing loss, Hyperlipidemia, Kidney disease, Learning disabilities, Mental illness, Mental retardation, Miscarriages / Stillbirths, Vision loss, or Varicose Veins.    Physical Exam: Patient Vitals for the past 24 hrs:  BP Temp Temp src Pulse Resp SpO2  10/17/13 0348 123/91 mmHg - - 75 18 98 %  10/17/13 0207 135/95  mmHg - - 77 17 96 %  10/17/13 0103 165/111 mmHg - - 78 17 -  10/16/13 2245 177/113 mmHg 98.3 F (36.8 C) Oral 83 20 100 %    1. General:  in No Acute distress 2. Psychological: Alert and Oriented 3. Head/ENT:   Moist  Mucous Membranes                          Head Non traumatic, neck supple                          Normal   Dentition 4. SKIN: normal   Skin turgor,  Skin clean Dry and intact no rash 5. Heart: Regular rate and rhythm no Murmur, Rub or gallop 6. Lungs: Clear to auscultation bilaterally, no wheezes or crackles   7. Abdomen: Soft, non-tender, Non distended 8. Lower extremities: no clubbing, cyanosis, or edema 9. Neurologically Grossly intact, moving all 4 extremities equally 10. MSK: Normal range of motion  body mass index is unknown because there is no weight on file.   Labs on Admission:   Recent Labs  10/16/13 2322  NA 141  K 3.9  CL 105  CO2 23  GLUCOSE 91  BUN 11  CREATININE 0.65  CALCIUM 9.0   No results found for this basename: AST, ALT, ALKPHOS, BILITOT, PROT, ALBUMIN,  in the last 72 hours No results found for this basename: LIPASE, AMYLASE,  in the last 72 hours  Recent Labs  10/16/13 2322  WBC 5.8  HGB 11.4*  HCT 34.9*  MCV 88.4  PLT 308   No results found for this basename: CKTOTAL, CKMB, CKMBINDEX, TROPONINI,  in the last 72 hours No results found for this basename: TSH, T4TOTAL, FREET3, T3FREE, THYROIDAB,  in the last 72 hours No results found for this basename: VITAMINB12, FOLATE, FERRITIN, TIBC, IRON, RETICCTPCT,  in the last 72 hours No results found for this basename: HGBA1C    The CrCl is unknown because both a height and weight (above a minimum accepted value) are required for this calculation. ABG No results found for this basename: phart, pco2, po2, hco3, tco2, acidbasedef, o2sat     Lab Results  Component Value Date   DDIMER 1.85* 10/17/2013     Other results:  I have pearsonaly reviewed this: ECG REPORT  Rate: 75   Rhythm: NSR ST&T Change: no ischemia  BNP (last 3 results) No results found for this basename: PROBNP,  in the last 8760 hours  There were no vitals filed for this visit.   Cultures:    Component Value Date/Time   SDES URINE, CLEAN CATCH 04/30/2012 2156   SPECREQUEST NONE 04/30/2012 2156   CULT ESCHERICHIA COLI 04/30/2012 2156   REPTSTATUS 05/02/2012 FINAL 04/30/2012 2156    Radiological Exams on Admission: Dg Chest 2 View  10/16/2013   CLINICAL DATA:  Chest pain  EXAM: CHEST  2 VIEW  COMPARISON:  09/25/2010  FINDINGS: Normal heart size and mediastinal contours. No acute infiltrate or edema. No effusion or pneumothorax. No  acute osseous findings.  IMPRESSION: No active cardiopulmonary disease.   Electronically Signed   By: Tiburcio PeaJonathan  Watts M.D.   On: 10/16/2013 23:59   Ct Angio Chest Pe W/cm &/or Wo Cm  10/17/2013   CLINICAL DATA:  Chest pain and shortness of breath  EXAM: CT ANGIOGRAPHY CHEST WITH CONTRAST  TECHNIQUE: Multidetector CT imaging of the chest was performed using the standard protocol during bolus administration of intravenous contrast. Multiplanar CT image reconstructions and MIPs were obtained to evaluate the vascular anatomy.  CONTRAST:  100mL OMNIPAQUE IOHEXOL 350 MG/ML SOLN  COMPARISON:  None.  FINDINGS: THORACIC INLET/BODY WALL:  No acute abnormality.  MEDIASTINUM:  There is a large, nearly occlusive pulmonary embolism to the interlobar pulmonary artery on the right, extending into branches of the right middle and lower lobe segmental pulmonary arteries. No right ventricular enlargement suggestive of strain. No cardiomegaly or pericardial effusion. No lymphadenopathy.  LUNG WINDOWS:  No consolidation.  No effusion.  UPPER ABDOMEN:  No acute findings.  OSSEOUS:  No acute fracture.  Critical Value/emergent results were called by telephone at the time of interpretation on 10/17/2013 at 3:10 am to Dr. Loren RacerAVID YELVERTON , who verbally acknowledged these results.  Review of the MIP images  confirms the above findings.  IMPRESSION: Acute pulmonary embolism to the interlobar pulmonary artery, nearly occlusive.   Electronically Signed   By: Tiburcio PeaJonathan  Watts M.D.   On: 10/17/2013 03:12    Chart has been reviewed  Assessment/Plan 33 yo F with hx of recent second trimester miscarage here with PE.   Present on Admission:  . Pulmonary embolism - admit to tele on heparin ggt. For now start on coumadin but will need to decide of best long term anticoagulation. Obtain hypercoagulable panel but most likely hyper coagulopathy in the setting of recent pregnancy.    Prophylaxis:   Heparin   CODE STATUS:  FULL CODE    Other plan as per orders.  I have spent a total of 55 min on this admission  Jjesus Dingley 10/17/2013, 3:54 AM  [facility]  Pager [phone]   If 7AM-7PM, please contact the day team taking care of the patient  [email or web address]  Password [email or web address]

## 2013-10-17 NOTE — Progress Notes (Signed)
RN was notified by CMT that the patient had 9 beats of V-tach.   Vitals were: 99.68F,HR 79.RR20.B/P 161/101 O2 sats were 100% on RA.  Patient stated that she felt a little"flutter in her chest".  PCP on call was notified.

## 2013-10-17 NOTE — Progress Notes (Addendum)
ANTICOAGULATION CONSULT NOTE - Initial Consult  Pharmacy Consult for Heparin, warfarin Indication: pulmonary embolus  Allergies  Allergen Reactions  . Penicillins Anaphylaxis    Patient Measurements: Height: 5\' 7"  (170.2 cm) Weight: 266 lb 14.4 oz (121.065 kg) IBW/kg (Calculated) : 61.6 Heparin Dosing Weight:   Vital Signs: Temp: 98.2 F (36.8 C) (08/22 0520) Temp src: Oral (08/22 0520) BP: 128/89 mmHg (08/22 0520) Pulse Rate: 83 (08/22 0520)  Labs:  Recent Labs  10/16/13 2322  HGB 11.4*  HCT 34.9*  PLT 308  CREATININE 0.65    Estimated Creatinine Clearance: 133.6 ml/min (by C-G formula based on Cr of 0.65).   Medical History: Past Medical History  Diagnosis Date  . Hx of varicella   . Breast feeding status of mother   . Hypertension   . Pregnancy induced hypertension     Medications:  Infusions:  . sodium chloride    . heparin      Assessment: Patient with new PE.    Goal of Therapy:  INR 2-3 Heparin level 0.3-0.7 units/ml Monitor platelets by anticoagulation protocol: Yes   Plan:  Heparin bolus 3000 units iv x1 Heparin drip at 1700 units/hr Daily heparin level and CBC Next heparin level at  1200 Daily INR Follow up with INR to dose warfarin    Darlina GuysGrimsley Jr, Jacquenette ShoneJulian Crowford 10/17/2013,5:40 AM

## 2013-10-17 NOTE — Progress Notes (Addendum)
ANTICOAGULATION CONSULT NOTE - Follow up  Pharmacy Consult for Heparin, warfarin Indication: pulmonary embolus  Allergies  Allergen Reactions  . Penicillins Anaphylaxis    Patient Measurements: Height: 5\' 7"  (170.2 cm) Weight: 266 lb 14.4 oz (121.065 kg) IBW/kg (Calculated) : 61.6 Heparin Dosing Weight:   Vital Signs: Temp: 98.2 F (36.8 C) (08/22 0520) Temp src: Oral (08/22 0520) BP: 128/89 mmHg (08/22 0520) Pulse Rate: 83 (08/22 0520)  Labs:  Recent Labs  10/16/13 2322 10/17/13 0705  HGB 11.4* 10.5*  HCT 34.9* 32.5*  PLT 308 290  LABPROT  --  14.1  INR  --  1.09  CREATININE 0.65  --     Estimated Creatinine Clearance: 133.6 ml/min (by C-G formula based on Cr of 0.65).   Medical History: Past Medical History  Diagnosis Date  . Hx of varicella   . Breast feeding status of mother   . Hypertension   . Pregnancy induced hypertension     Medications:  Infusions:  . sodium chloride 100 mL/hr at 10/17/13 0548  . heparin 1,700 Units/hr (10/17/13 0555)    ASSESSMENT: 33yo F one week post-partum(baby did not survive), presents with SOB and CP. CT angiogram confirmed PE. Pharmacy was asked to start heparin and Coumadin. Baseline INR is wnl. Coumadin dose predictor points = 10. Will need 5 day overlap of heparin and Coumadin and INR  2 or greater. Could consider switching heparin to Lovenox when stable for discharge. Recommend Lovenox 120mg  sq q12h.  Goal of Therapy:  INR 2-3 Heparin level 0.3-0.7 units/ml Monitor platelets by anticoagulation protocol: Yes   Plan:   Cont heparin drip at 1700 units/hr  Next heparin level at 1200.  Give Coumadin 10mg  this am.  Daily heparin level, INR, and CBC.  Ordered Coumadin booklet and video. Will provide counseling.  Charolotte Ekeom Shiree Altemus, PharmD, pager [phone]. 10/17/2013,8:01 AM.

## 2013-10-17 NOTE — Progress Notes (Signed)
Echocardiogram 2D Echocardiogram has been performed.  Estelle GrumblesMyers, Ariba Lehnen J 10/17/2013, 10:25 AM

## 2013-10-17 NOTE — ED Provider Notes (Signed)
CSN: [id number]     Arrival date & time 10/16/13  2236 History   First MD Initiated Contact with Patient 10/17/13 0019     Chief Complaint  Patient presents with  . Chest Pain     (Consider location/radiation/quality/duration/timing/severity/associated sxs/prior Treatment) HPI Patient presents with chest pressure in the upper part of the bilateral chest starting this evening around 8 PM while at work. It was associated with shortness of breath. Not exacerbated by deep breathing or palpation. 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 No cough. A recent extended travel. No extremity swelling or pain. Patient has no previous history of coronary artery disease. She does not smoke. She states she has a family history of early coronary artery disease. Past Medical History  Diagnosis Date  . Hx of varicella   . Breast feeding status of mother   . Hypertension   . Pregnancy induced hypertension    Past Surgical History  Procedure Laterality Date  . No past surgeries     Family History  Problem Relation Age of Onset  . Asthma Mother   . Heart disease Mother   . Hypertension Mother   . Stroke Mother   . Cancer Father     prostate  . Alcohol abuse Neg Hx   . Arthritis Neg Hx   . Birth defects Neg Hx   . COPD Neg Hx   . Depression Neg Hx   . Diabetes Neg Hx   . Drug abuse Neg Hx   . Early death Neg Hx   . Hearing loss Neg Hx   . Hyperlipidemia Neg Hx   . Kidney disease Neg Hx   . Learning disabilities Neg Hx   . Mental illness Neg Hx   . Mental retardation Neg Hx   . Miscarriages / Stillbirths Neg Hx   . Vision loss Neg Hx   . Varicose Veins Neg Hx    History  Substance Use Topics  . Smoking status: Never Smoker   . Smokeless tobacco: Never Used  . Alcohol Use: Yes     Comment: occasionally   OB History   Grav Para Term Preterm Abortions TAB SAB Ect Mult Living   6 6 5 1      6      Review of Systems  Constitutional: Negative for  fever and chills.  Respiratory: Positive for chest tightness and shortness of breath. Negative for cough.   Cardiovascular: Positive for chest pain. Negative for palpitations and leg swelling.  Gastrointestinal: Negative for nausea, vomiting, abdominal pain, diarrhea and constipation.  Genitourinary: Negative for dysuria, frequency and flank pain.  Musculoskeletal: Negative for back pain, neck pain and neck stiffness.  Skin: Negative for rash and wound.  Neurological: Negative for dizziness, weakness, light-headedness, numbness and headaches.  All other systems reviewed and are negative.     Allergies  Penicillins  Home Medications   Prior to Admission medications   Medication Sig Start Date End Date Taking? Authorizing Provider  ibuprofen (ADVIL,MOTRIN) 600 MG tablet Take 1 tablet (600 mg total) by mouth every 6 (six) hours. 10/10/13  Yes Oliver Pila, MD  Multiple Vitamin (MULTIVITAMIN WITH MINERALS) TABS tablet Take 1 tablet by mouth daily.   Yes Historical Provider, MD  oxyCODONE-acetaminophen (PERCOCET/ROXICET) 5-325 MG per tablet Take 1-2 tablets by mouth every 4 (four) hours as needed for severe pain. 10/10/13  Yes Oliver Pila, MD   BP 135/95  Pulse 77  Temp(Src)  98.3 F (36.8 C) (Oral)  Resp 17  SpO2 96% Physical Exam  Nursing note and vitals reviewed. Constitutional: She is oriented to person, place, and time. She appears well-developed and well-nourished. No distress.  HENT:  Head: Normocephalic and atraumatic.  Mouth/Throat: Oropharynx is clear and moist.  Eyes: EOM are normal. Pupils are equal, round, and reactive to light.  Neck: Normal range of motion. Neck supple.  Cardiovascular: Normal rate and regular rhythm.   Pulmonary/Chest: Effort normal and breath sounds normal. No respiratory distress. She has no wheezes. She has no rales. She exhibits tenderness (mild tenderness to palpation over the superior chest. There's no crepitus deformity.).   Abdominal: Soft. Bowel sounds are normal. She exhibits no distension and no mass. There is no tenderness. There is no rebound and no guarding.  Musculoskeletal: Normal range of motion. She exhibits no edema and no tenderness.  no calf swelling or tenderness.  Neurological: She is alert and oriented to person, place, and time.  Skin: Skin is warm and dry. No rash noted. No erythema.  Psychiatric: She has a normal mood and affect. Her behavior is normal.    ED Course  Procedures (including critical care time) Labs Review Labs Reviewed  CBC - Abnormal; Notable for the following:    Hemoglobin 11.4 (*)    HCT 34.9 (*)    All other components within normal limits  D-DIMER, QUANTITATIVE - Abnormal; Notable for the following:    D-Dimer, Quant 1.85 (*)    All other components within normal limits  BASIC METABOLIC PANEL  I-STAT TROPOININ, ED    Imaging Review Dg Chest 2 View  10/16/2013   CLINICAL DATA:  Chest pain  EXAM: CHEST  2 VIEW  COMPARISON:  09/25/2010  FINDINGS: Normal heart size and mediastinal contours. No acute infiltrate or edema. No effusion or pneumothorax. No acute osseous findings.  IMPRESSION: No active cardiopulmonary disease.   Electronically Signed   By: Tiburcio Pea M.D.   On: 10/16/2013 23:59   Ct Angio Chest Pe W/cm &/or Wo Cm  10/17/2013   CLINICAL DATA:  Chest pain and shortness of breath  EXAM: CT ANGIOGRAPHY CHEST WITH CONTRAST  TECHNIQUE: Multidetector CT imaging of the chest was performed using the standard protocol during bolus administration of intravenous contrast. Multiplanar CT image reconstructions and MIPs were obtained to evaluate the vascular anatomy.  CONTRAST:  OMNIPAQUE IOHEXOL 350 MG/ML SOLN  COMPARISON:  None.  FINDINGS: THORACIC INLET/BODY WALL:  No acute abnormality.  MEDIASTINUM:  There is a large, nearly occlusive pulmonary embolism to the interlobar pulmonary artery on the right, extending into branches of the right middle and lower lobe  segmental pulmonary arteries. No right ventricular enlargement suggestive of strain. No cardiomegaly or pericardial effusion. No lymphadenopathy.  LUNG WINDOWS:  No consolidation.  No effusion.  UPPER ABDOMEN:  No acute findings.  OSSEOUS:  No acute fracture.  Critical Value/emergent results were called by telephone at the time of interpretation on 10/17/2013 at 3:10 am to Dr. Loren Racer , who verbally acknowledged these results.  Review of the MIP images confirms the above findings.  IMPRESSION: Acute pulmonary embolism to the interlobar pulmonary artery, nearly occlusive.   Electronically Signed   By: Tiburcio Pea M.D.   On: 10/17/2013 03:12     EKG Interpretation None      Date: 10/17/2013  Rate: 75  Rhythm: normal sinus rhythm  QRS Axis: normal  Intervals: normal  ST/T Wave abnormalities: normal  Conduction Disutrbances:none  Narrative Interpretation:   Old EKG Reviewed: none available   MDM   Final diagnoses:  None   Patient remained hemodynamically stable in the emergency department. She states her chest pain is nearly resolved. Discussed with hospitalist and will admit.    Loren Raceravid Melo Stauber, MD 10/17/13 [phone]

## 2013-10-17 NOTE — ED Notes (Signed)
Patient transported to CT 

## 2013-10-18 LAB — BASIC METABOLIC PANEL
ANION GAP: 13 (ref 5–15)
BUN: 9 mg/dL (ref 6–23)
CALCIUM: 8.5 mg/dL (ref 8.4–10.5)
CO2: 22 mEq/L (ref 19–32)
CREATININE: 0.62 mg/dL (ref 0.50–1.10)
Chloride: 106 mEq/L (ref 96–112)
GFR calc Af Amer: >90 mL/min (ref >90)
GFR calc non Af Amer: >90 mL/min (ref >90)
Glucose, Bld: 105 mg/dL — ABNORMAL HIGH (ref 70–99)
Potassium: 4.2 mEq/L (ref 3.7–5.3)
Sodium: 141 mEq/L (ref 137–147)

## 2013-10-18 LAB — CBC
HCT: 33.7 % — ABNORMAL LOW (ref 36.0–46.0)
Hemoglobin: 11.3 g/dL — ABNORMAL LOW (ref 12.0–15.0)
MCH: 29.4 pg (ref 26.0–34.0)
MCHC: 33.5 g/dL (ref 30.0–36.0)
MCV: 87.8 fL (ref 78.0–100.0)
PLATELETS: 312 10*3/uL (ref 150–400)
RBC: 3.84 MIL/uL — ABNORMAL LOW (ref 3.87–5.11)
RDW: 14.4 % (ref 11.5–15.5)
WBC: 8.4 10*3/uL (ref 4.0–10.5)

## 2013-10-18 LAB — HEPARIN LEVEL (UNFRACTIONATED): Heparin Unfractionated: 0.39 IU/mL (ref 0.30–0.70)

## 2013-10-18 LAB — MAGNESIUM: Magnesium: 1.7 mg/dL (ref 1.5–2.5)

## 2013-10-18 MED ORDER — APIXABAN 5 MG PO TABS: ORAL_TABLET | ORAL | Status: DC

## 2013-10-18 MED ORDER — APIXABAN 5 MG PO TABS
10.0000 mg | ORAL_TABLET | Freq: Two times a day (BID) | ORAL | Status: DC
  Administered 2013-10-18: 10 mg via ORAL
  Filled 2013-10-18 (×2): qty 2

## 2013-10-18 MED ORDER — APIXABAN 5 MG PO TABS: 5.0000 mg | ORAL_TABLET | Freq: Two times a day (BID) | ORAL | Status: DC

## 2013-10-18 MED ORDER — MENTHOL 3 MG MT LOZG
1.0000 | LOZENGE | OROMUCOSAL | Status: DC | PRN
  Administered 2013-10-18: 3 mg via ORAL
  Filled 2013-10-18: qty 9

## 2013-10-18 NOTE — Discharge Summary (Signed)
Physician Discharge Summary  Keren J [place] Wade ZOX:[id number] DOB: May 02, 1980 DOA: 10/16/2013  PCP: No PCP Per Patient  Admit date: 10/16/2013 Discharge date: 10/18/2013  Time spent: 40 minutes  Recommendations for Outpatient Follow-up:  1. Followup with primary obstetrician in one week  Discharge Diagnoses:  Active Problems:   Pulmonary embolism   Pulmonary embolus   Discharge Condition: Stable  Diet recommendation: Regular  Filed Weights   10/17/13 0520  Weight: 121.065 kg (266 lb 14.4 oz)    History of present illness:  Mindy Wade is a 33 y.o. female  has a past medical history of varicella; Breast feeding status of mother; Hypertension; and Pregnancy induced hypertension.  Presented with  Patient was recently discovered to be pregnant and delivered baby girl last week at 21 weeks. The Baby was pronounce dead and patient was discharged to home. She has been having shortness of breath and chest pain for the past 1 week. She works for post [employer]. No recent trips. She has 5 Older children. She is not currently breast feeding. No prior miscaraged no hx of miscarages in the family. She was not using any birth control. Denies any leg swelling.  Hospitalist was called for admission for pulmonary embolism post partum.    Hospital Course:   Acute pulmonary embolism  -Patient presented to the hospital with chest pain, admitted to telemetry.   -CT showed nearly occlusive PE involving the interlobar pulmonary artery, patient denies any hemoptysis. -Likely developed because of the recent pregnancy/miscarriage, hypercoagulability panel obtained.  -Patient started on heparin Gtt, discharged on Eliquis. -Recommend anticoagulation for 3-6 months, followup on hypercoagulability panel as outpatient.  Recent miscarriage  -Patient educated that she will have more than average vaginal bleeding because of the Eliquis.  -She needs close followup with her obstetrician and primary care  physician.   Procedures:  None  Consultations:  None  Discharge Exam: Filed Vitals:   10/18/13 0450  BP: 154/99  Pulse: 73  Temp: 98.1 F (36.7 C)  Resp: 18   General: Alert and awake, oriented x3, not in any acute distress. HEENT: anicteric sclera, pupils reactive to light and accommodation, EOMI CVS: S1-S2 clear, no murmur rubs or gallops Chest: clear to auscultation bilaterally, no wheezing, rales or rhonchi Abdomen: soft nontender, nondistended, normal bowel sounds, no organomegaly Extremities: no cyanosis, clubbing or edema noted bilaterally Neuro: Cranial nerves II-XII intact, no focal neurological deficits  Discharge Instructions You were cared for by a hospitalist during your hospital stay. If you have any questions about your discharge medications or the care you received while you were in the hospital after you are discharged, you can call the unit and asked to speak with the hospitalist on call if the hospitalist that took care of you is not available. Once you are discharged, your primary care physician will handle any further medical issues. Please note that NO REFILLS for any discharge medications will be authorized once you are discharged, as it is imperative that you return to your primary care physician (or establish a relationship with a primary care physician if you do not have one) for your aftercare needs so that they can reassess your need for medications and monitor your lab values.  Discharge Instructions   Increase activity slowly    Complete by:  As directed             Medication List         apixaban 5 MG Tabs tablet  Commonly known as:  Everlene Balls  Take 2 tablets, (total of 10 mg) twice a day for 7 days, then take one tablet (5 mg) by mouth twice a day     ibuprofen 600 MG tablet  Commonly known as:  ADVIL,MOTRIN  Take 1 tablet (600 mg total) by mouth every 6 (six) hours.     multivitamin with minerals Tabs tablet  Take 1 tablet by mouth  daily.     oxyCODONE-acetaminophen 5-325 MG per tablet  Commonly known as:  PERCOCET/ROXICET  Take 1-2 tablets by mouth every 4 (four) hours as needed for severe pain.       Allergies  Allergen Reactions  . Penicillins Anaphylaxis       Follow-up Information   Follow up with Oliver Pila, MD In 1 week.   Specialty:  Obstetrics and Gynecology   Contact information:   [address] [place] [address] [phone]        The results of significant diagnostics from this hospitalization (including imaging, microbiology, ancillary and laboratory) are listed below for reference.    Significant Diagnostic Studies: Dg Chest 2 View  10/16/2013   CLINICAL DATA:  Chest pain  EXAM: CHEST  2 VIEW  COMPARISON:  09/25/2010  FINDINGS: Normal heart size and mediastinal contours. No acute infiltrate or edema. No effusion or pneumothorax. No acute osseous findings.  IMPRESSION: No active cardiopulmonary disease.   Electronically Signed   By: Tiburcio Pea M.D.   On: 10/16/2013 23:59   Ct Angio Chest Pe W/cm &/or Wo Cm  10/17/2013   CLINICAL DATA:  Chest pain and shortness of breath  EXAM: CT ANGIOGRAPHY CHEST WITH CONTRAST  TECHNIQUE: Multidetector CT imaging of the chest was performed using the standard protocol during bolus administration of intravenous contrast. Multiplanar CT image reconstructions and MIPs were obtained to evaluate the vascular anatomy.  CONTRAST:  OMNIPAQUE IOHEXOL 350 MG/ML SOLN  COMPARISON:  None.  FINDINGS: THORACIC INLET/BODY WALL:  No acute abnormality.  MEDIASTINUM:  There is a large, nearly occlusive pulmonary embolism to the interlobar pulmonary artery on the right, extending into branches of the right middle and lower lobe segmental pulmonary arteries. No right ventricular enlargement suggestive of strain. No cardiomegaly or pericardial effusion. No lymphadenopathy.  LUNG WINDOWS:  No consolidation.  No effusion.  UPPER ABDOMEN:  No acute  findings.  OSSEOUS:  No acute fracture.  Critical Value/emergent results were called by telephone at the time of interpretation on 10/17/2013 at 3:10 am to Dr. Loren Racer , who verbally acknowledged these results.  Review of the MIP images confirms the above findings.  IMPRESSION: Acute pulmonary embolism to the interlobar pulmonary artery, nearly occlusive.   Electronically Signed   By: Tiburcio Pea M.D.   On: 10/17/2013 03:12   US Ob Comp + 14 Wk  10/09/2013   OBSTETRICAL ULTRASOUND: This exam was performed within a [facility]. The OB US report was generated in the AS system, and faxed to the ordering physician.   This report is available in the [employer]. See the AS Obstetric US report via the Image Link.  [place] Mfm Ob Transvaginal  10/09/2013   OBSTETRICAL ULTRASOUND: This exam was performed within a [facility]. The OB US report was generated in the AS system, and faxed to the ordering physician.   This report is available in the [employer]. See the AS Obstetric US report via the Image Link.   Microbiology: Recent Results (from the past 240 hour(s))  GC/CHLAMYDIA PROBE AMP     Status: None   Collection Time    10/09/13 11:36 AM      Result Value Ref Range Status   CT Probe RNA NEGATIVE  NEGATIVE Final   GC Probe RNA NEGATIVE  NEGATIVE Final   Comment: (NOTE)                                                                                               **Normal Reference Range: Negative**          Assay performed using the Gen-Probe APTIMA COMBO2 (R) Assay.     Acceptable specimen types for this assay include APTIMA Swabs (Unisex,     endocervical, urethral, or vaginal), first void urine, and ThinPrep     liquid based cytology samples.     Performed at [facility]: Basic Metabolic Panel:  Recent Labs Lab 10/16/13 2322 10/17/13 0705 10/17/13 2300 10/18/13 0105  NA 141 141  --  141  K 3.9 3.4*  --  4.2  CL  105 104  --  106  CO2 23 24  --  22  GLUCOSE 91 101*  --  105*  BUN 11 9  --  9  CREATININE 0.65 0.61  --  0.62  CALCIUM 9.0 8.7  --  8.5  MG  --  1.8 1.7  --   PHOS  --  3.3  --   --    Liver Function Tests:  Recent Labs Lab 10/17/13 0705  AST 11  ALT 8  ALKPHOS 72  BILITOT <0.2*  PROT 7.1  ALBUMIN 2.9*   No results found for this basename: LIPASE, AMYLASE,  in the last 168 hours No results found for this basename: AMMONIA,  in the last 168 hours CBC:  Recent Labs Lab 10/16/13 2322 10/17/13 0705 10/18/13 0105  WBC 5.8 7.2 8.4  HGB 11.4* 10.5* 11.3*  HCT 34.9* 32.5* 33.7*  MCV 88.4 88.8 87.8  PLT 308 290 312   Cardiac Enzymes:  Recent Labs Lab 10/17/13 0705 10/17/13 1152 10/17/13 1810  TROPONINI <0.30 <0.30 <0.30   BNP: BNP (last 3 results) No results found for this basename: PROBNP,  in the last 8760 hours CBG: No results found for this basename: GLUCAP,  in the last 168 hours     Signed:  Aspynn Clover A  [facility] 10/18/2013, 10:18 AM

## 2013-10-18 NOTE — Progress Notes (Signed)
PAtient discharged home with family, discharge instructions given and explained to patient andshe verbalized understanding, denies any pain/distress. No wound noted, skin intact.

## 2013-10-18 NOTE — Progress Notes (Signed)
ANTICOAGULATION CONSULT NOTE - Follow up  Pharmacy Consult for Heparin-->Apixaban Indication: pulmonary embolus  Allergies  Allergen Reactions  . Penicillins Anaphylaxis    Patient Measurements: Height:  (170.2 cm) Weight: 266 lb 14.4 oz (121.065 kg) IBW/kg (Calculated) : 61.6 Heparin Dosing Weight:   Vital Signs: Temp: 98.1 F (36.7 C) (08/23 0450) Temp src: Oral (08/23 0450) BP: 154/99 mmHg (08/23 0450) Pulse Rate: 73 (08/23 0450)  Labs:  Recent Labs  10/16/13 2322 10/17/13 0705 10/17/13 1152 10/17/13 1810 10/18/13 0105  HGB 11.4* 10.5*  --   --  11.3*  HCT 34.9* 32.5*  --   --  33.7*  PLT 308 290  --   --  312  LABPROT  --  14.1  --   --   --   INR  --  1.09  --   --   --   HEPARINUNFRC  --   --  0.35 0.24* 0.39  CREATININE 0.65 0.61  --   --  0.62  TROPONINI  --  <0.30 <0.30 <0.30  --     Estimated Creatinine Clearance: 133.6 ml/min (by C-G formula based on Cr of 0.62).   Medical History: Past Medical History  Diagnosis Date  . Hx of varicella   . Breast feeding status of mother   . Hypertension   . Pregnancy induced hypertension     ASSESSMENT: 33yo F one week post-partum(baby did not survive), presents with SOB and CP. CT angiogram confirmed PE. Pharmacy was asked to start heparin and Coumadin. Baseline INR is wnl. Coumadin dose predictor points = 10. Will need 5 day overlap of heparin and Coumadin and INR  2 or greater. Could consider switching heparin to Lovenox when stable for discharge. Recommend Lovenox  sq q12h.  Significant events/clinical course: 8/22: First HL is in therapeutic range. No bleeding reported/documented. Dr. Arthor Captain may discharge tomorrow on Eliquis. PM HL = 0.24 subtherapeutic - Increase rate from 1700 to 2000 units/hr 8/23: Anticoagulation changed to apixaban  8/23: HL this AM therapeutic.  CBC stable.  SCr WNL.  Orders to transition to apixaban.   Goal of Therapy:  Monitor platelets by anticoagulation protocol:  Yes   Plan:   D/C heparin  At time of discontinuation of IV heparin, start Apixaban  BID x 7 days, followed by  BID thereafter.    F/u s/sxs bleeding especially since pt with recent miscarriage  Apixaban education by pharmacist today  Haynes Hoehn, PharmD, BCPS 10/18/2013, 7:41 AM  Pager: [phone]

## 2013-10-18 NOTE — Discharge Instructions (Signed)
Information on my medicine - ELIQUIS (apixaban)  This medication education was reviewed with me or my healthcare representative as part of my discharge preparation.  The pharmacist that spoke with me during my hospital stay was:  Reece Packer, [facility]  Why was Eliquis prescribed for you? Eliquis was prescribed to treat blood clots that may have been found in the veins of your legs (deep vein thrombosis) or in your lungs (pulmonary embolism) and to reduce the risk of them occurring again.  What do You need to know about Eliquis ? The starting dose is 10 mg (two 5 mg tablets) taken TWICE daily for the FIRST SEVEN (7) DAYS, then on (enter date)  10/25/13  the dose is reduced to ONE 5 mg tablet taken TWICE daily.  Eliquis may be taken with or without food.   Try to take the dose about the same time in the morning and in the evening. If you have difficulty swallowing the tablet whole please discuss with your pharmacist how to take the medication safely.  Take Eliquis exactly as prescribed and DO NOT stop taking Eliquis without talking to the doctor who prescribed the medication.  Stopping may increase your risk of developing a new blood clot.  Refill your prescription before you run out.  After discharge, you should have regular check-up appointments with your healthcare provider that is prescribing your Eliquis.    What do you do if you miss a dose? If a dose of ELIQUIS is not taken at the scheduled time, take it as soon as possible on the same day and twice-daily administration should be resumed. The dose should not be doubled to make up for a missed dose.  Important Safety Information A possible side effect of Eliquis is bleeding. You should call your healthcare provider right away if you experience any of the following:   Bleeding from an injury or your nose that does not stop.   Unusual colored urine (red or dark brown) or unusual colored stools (red or black).   Unusual  bruising for unknown reasons.   A serious fall or if you hit your head (even if there is no bleeding).  Some medicines may interact with Eliquis and might increase your risk of bleeding or clotting while on Eliquis. To help avoid this, consult your healthcare provider or pharmacist prior to using any new prescription or non-prescription medications, including herbals, vitamins, non-steroidal anti-inflammatory drugs (NSAIDs) and supplements.  This website has more information on Eliquis (apixaban): http://www.eliquis.com/eliquis/home

## 2013-10-18 NOTE — Progress Notes (Signed)
ANTICOAGULATION CONSULT NOTE - Follow Up Consult  Pharmacy Consult for Heparin Indication: pulmonary embolus  Allergies  Allergen Reactions  . Penicillins Anaphylaxis    Patient Measurements: Height:  (170.2 cm) Weight: 266 lb 14.4 oz (121.065 kg) IBW/kg (Calculated) : 61.6 Heparin Dosing Weight:   Vital Signs: Temp: 98.1 F (36.7 C) (08/23 0450) Temp src: Oral (08/23 0450) BP: 154/99 mmHg (08/23 0450) Pulse Rate: 73 (08/23 0450)  Labs:  Recent Labs  10/16/13 2322 10/17/13 0705 10/17/13 1152 10/17/13 1810 10/18/13 0105  HGB 11.4* 10.5*  --   --  11.3*  HCT 34.9* 32.5*  --   --  33.7*  PLT 308 290  --   --  312  LABPROT  --  14.1  --   --   --   INR  --  1.09  --   --   --   HEPARINUNFRC  --   --  0.35 0.24* 0.39  CREATININE 0.65 0.61  --   --  0.62  TROPONINI  --  <0.30 <0.30 <0.30  --     Estimated Creatinine Clearance: 133.6 ml/min (by C-G formula based on Cr of 0.62).   Medications:  Infusions:  . heparin 2,000 Units/hr (10/17/13 1913)    Assessment: Patient with Heparin level at goal.  No issues noted.  Goal of Therapy:  Heparin level 0.3-0.7 units/ml Monitor platelets by anticoagulation protocol: Yes   Plan:  Continue heparin drip at current rate Recheck level 0800  Darlina Guys, Jacquenette Shone Crowford 10/18/2013,5:47 AM

## 2013-10-21 ENCOUNTER — Emergency Department (HOSPITAL_COMMUNITY)
Admission: EM | Admit: 2013-10-21 | Discharge: 2013-10-21 | Disposition: A | Discharge status: Home / Self Care | Payer: Medicaid Other | Attending: Emergency Medicine | Admitting: Emergency Medicine

## 2013-10-21 ENCOUNTER — Encounter (HOSPITAL_COMMUNITY): Payer: Self-pay | Admitting: Emergency Medicine

## 2013-10-21 ENCOUNTER — Emergency Department (HOSPITAL_COMMUNITY): Payer: Medicaid Other

## 2013-10-21 DIAGNOSIS — Z79899 Other long term (current) drug therapy: Secondary | ICD-10-CM | POA: Insufficient documentation

## 2013-10-21 DIAGNOSIS — I10 Essential (primary) hypertension: Secondary | ICD-10-CM | POA: Diagnosis not present

## 2013-10-21 DIAGNOSIS — I2699 Other pulmonary embolism without acute cor pulmonale: Secondary | ICD-10-CM | POA: Insufficient documentation

## 2013-10-21 DIAGNOSIS — R079 Chest pain, unspecified: Secondary | ICD-10-CM | POA: Insufficient documentation

## 2013-10-21 DIAGNOSIS — Z7902 Long term (current) use of antithrombotics/antiplatelets: Secondary | ICD-10-CM | POA: Diagnosis not present

## 2013-10-21 DIAGNOSIS — Z880 Allergy status to penicillin: Secondary | ICD-10-CM | POA: Diagnosis not present

## 2013-10-21 DIAGNOSIS — R42 Dizziness and giddiness: Secondary | ICD-10-CM | POA: Insufficient documentation

## 2013-10-21 DIAGNOSIS — Z8619 Personal history of other infectious and parasitic diseases: Secondary | ICD-10-CM | POA: Diagnosis not present

## 2013-10-21 DIAGNOSIS — Z86711 Personal history of pulmonary embolism: Secondary | ICD-10-CM

## 2013-10-21 LAB — BASIC METABOLIC PANEL
Anion gap: 15 (ref 5–15)
BUN: 9 mg/dL (ref 6–23)
CHLORIDE: 102 meq/L (ref 96–112)
CO2: 24 mEq/L (ref 19–32)
CREATININE: 0.69 mg/dL (ref 0.50–1.10)
Calcium: 9.3 mg/dL (ref 8.4–10.5)
GFR calc non Af Amer: >90 mL/min (ref >90)
GLUCOSE: 69 mg/dL — AB (ref 70–99)
POTASSIUM: 3.6 meq/L — AB (ref 3.7–5.3)
Sodium: 141 mEq/L (ref 137–147)

## 2013-10-21 LAB — I-STAT TROPONIN, ED
TROPONIN I, POC: 0 ng/mL (ref 0.00–0.08)
Troponin i, poc: 0 ng/mL (ref 0.00–0.08)

## 2013-10-21 LAB — CBC
HCT: 38.1 % (ref 36.0–46.0)
Hemoglobin: 13.1 g/dL (ref 12.0–15.0)
MCH: 30.7 pg (ref 26.0–34.0)
MCHC: 34.4 g/dL (ref 30.0–36.0)
MCV: 89.2 fL (ref 78.0–100.0)
Platelets: 351 10*3/uL (ref 150–400)
RBC: 4.27 MIL/uL (ref 3.87–5.11)
RDW: 14 % (ref 11.5–15.5)
WBC: 7.1 10*3/uL (ref 4.0–10.5)

## 2013-10-21 MED ORDER — OXYCODONE-ACETAMINOPHEN 5-325 MG PO TABS
2.0000 | ORAL_TABLET | Freq: Once | ORAL | Status: AC
  Administered 2013-10-21: 2 via ORAL
  Filled 2013-10-21: qty 2

## 2013-10-21 MED ORDER — NITROGLYCERIN 0.4 MG SL SUBL: 0.4000 mg | SUBLINGUAL_TABLET | SUBLINGUAL | Status: DC | PRN

## 2013-10-21 MED ORDER — ASPIRIN 325 MG PO TABS
325.0000 mg | ORAL_TABLET | ORAL | Status: AC
  Administered 2013-10-21: 325 mg via ORAL
  Filled 2013-10-21: qty 1

## 2013-10-21 MED ORDER — AMLODIPINE BESYLATE 5 MG PO TABS: 5.0000 mg | ORAL_TABLET | Freq: Every day | ORAL | Status: DC

## 2013-10-21 MED ORDER — OXYCODONE-ACETAMINOPHEN 5-325 MG PO TABS
1.0000 | ORAL_TABLET | ORAL | Status: DC | PRN
Start: 2013-10-21 — End: 2014-11-03

## 2013-10-21 MED ORDER — MORPHINE SULFATE 4 MG/ML IJ SOLN
4.0000 mg | Freq: Once | INTRAMUSCULAR | Status: AC
  Administered 2013-10-21: 4 mg via INTRAVENOUS
  Filled 2013-10-21: qty 1

## 2013-10-21 NOTE — ED Provider Notes (Signed)
CSN: [id number]     Arrival date & time 10/21/13  0201 History   First MD Initiated Contact with Patient 10/21/13 0231     Chief Complaint  Patient presents with  . Chest Pain    (Consider location/radiation/quality/duration/timing/severity/associated sxs/prior Treatment) HPI Comments: Patient is a 33 year old female recently diagnosed with pulmonary embolus, currently on chronic Eloquis, who presents to the emergency department for chest pain. Patient states the chest pain began at 1600 yesterday. Pain began it as intermittent, but became constant at 2200. Patient states the pain is located in her central and left chest and will occasionally travel to her left arm. She describes the pain as migratory rather than radiating. Nature of the pain is stabbing/tight/pressure-like. It occasionally worsens with deep breathing. Patient endorses some associated lightheadedness. She denies fever, syncope or near syncope, hemoptysis, shortness of breath, diaphoresis, nausea or vomiting, and weakness. Patient denies a personal history of ACS.  Patient is a 33 y.o. female presenting with chest pain. The history is provided by the patient. No language interpreter was used.  Chest Pain Associated symptoms: no shortness of breath     Past Medical History  Diagnosis Date  . Hx of varicella   . Breast feeding status of mother   . Hypertension   . Pregnancy induced hypertension    Past Surgical History  Procedure Laterality Date  . No past surgeries     Family History  Problem Relation Age of Onset  . Asthma Mother   . Heart disease Mother   . Hypertension Mother   . Stroke Mother   . Cancer Father     prostate  . Alcohol abuse Neg Hx   . Arthritis Neg Hx   . Birth defects Neg Hx   . COPD Neg Hx   . Depression Neg Hx   . Diabetes Neg Hx   . Drug abuse Neg Hx   . Early death Neg Hx   . Hearing loss Neg Hx   . Hyperlipidemia Neg Hx   . Kidney disease Neg Hx   . Learning disabilities Neg Hx    . Mental illness Neg Hx   . Mental retardation Neg Hx   . Miscarriages / Stillbirths Neg Hx   . Vision loss Neg Hx   . Varicose Veins Neg Hx    History  Substance Use Topics  . Smoking status: Never Smoker   . Smokeless tobacco: Never Used  . Alcohol Use: Yes     Comment: occasionally   OB History   Grav Para Term Preterm Abortions TAB SAB Ect Mult Living   Review of Systems  Respiratory: Negative for shortness of breath.   Cardiovascular: Positive for chest pain.  Neurological: Positive for light-headedness.  All other systems reviewed and are negative.   Allergies  Penicillins  Home Medications   Prior to Admission medications   Medication Sig Start Date End Date Taking? Authorizing Provider  apixaban (ELIQUIS) 5 MG TABS tablet Take 5 mg by mouth 2 (two) times daily.   Yes Historical Provider, MD  Multiple Vitamin (MULTIVITAMIN WITH MINERALS) TABS tablet Take 1 tablet by mouth daily.   Yes Historical Provider, MD  amLODipine (NORVASC) 5 MG tablet Take 1 tablet (5 mg total) by mouth daily. 10/21/13   Antony Madura, PA-C   BP 183/114  Pulse 71  Temp(Src) 98.7 F (37.1 C) (Oral)  Resp 17  SpO2 98%  Physical Exam  Nursing note and vitals reviewed. Constitutional: She is oriented to person, place, and time. She appears well-developed and well-nourished. No distress.  Nontoxic/nonseptic appearing  HENT:  Head: Normocephalic and atraumatic.  Mouth/Throat: Oropharynx is clear and moist. No oropharyngeal exudate.  Eyes: Conjunctivae and EOM are normal. No scleral icterus.  Neck: Normal range of motion. Neck supple.  No JVD  Cardiovascular: Normal rate, regular rhythm, normal heart sounds and intact distal pulses.   Pulmonary/Chest: Effort normal and breath sounds normal. No respiratory distress. She has no wheezes. She has no rales.  No tachypnea or dyspnea. Chest expansion symmetric  Musculoskeletal: Normal range of motion.  Neurological: She is  alert and oriented to person, place, and time. She exhibits normal muscle tone. Coordination normal.  GCS 15. Patient moves extremities without ataxia  Skin: Skin is warm and dry. No rash noted. She is not diaphoretic. No erythema. No pallor.  Psychiatric: She has a normal mood and affect. Her behavior is normal.    ED Course  Procedures (including critical care time) Labs Review Labs Reviewed  BASIC METABOLIC PANEL - Abnormal; Notable for the following:    Potassium 3.6 (*)    Glucose, Bld 69 (*)    All other components within normal limits  CBC  I-STAT TROPOININ, ED  Rosezena Sensor, ED    Imaging Review Dg Chest Port 1 View  10/21/2013   CLINICAL DATA:  Chest pain.  Recent acute pulmonary embolism.  EXAM: PORTABLE CHEST - 1 VIEW  COMPARISON:  10/16/2013 and chest CTA dated 10/17/2013.  FINDINGS: Interval enlargement of the cardiac silhouette. Clear lungs with normal vascularity. Normal appearing bones.  IMPRESSION: Interval cardiomegaly.   Electronically Signed   By: Gordan Payment M.D.   On: 10/21/2013 02:43     EKG Interpretation   Date/Time:  Wednesday October 21 2013 02:08:53 EDT Ventricular Rate:  81 PR Interval:  156 QRS Duration: 93 QT Interval:  348 QTC Calculation: 404 R Axis:   26 Text Interpretation:  Sinus rhythm No significant change since last  tracing Confirmed by OTTER  MD, OLGA ([id number]) on 10/21/2013 2:16:04 AM      MDM   Final diagnoses:  Chest pain, unspecified chest pain type  Hx pulmonary embolism    33 year old female presents to the emergency department for central and left-sided chest pain which began at 1600 yesterday. Pain has a pleuritic component and is occasionally worse with deep breathing. Symptoms associated with lightheadedness, but no other associated symptoms. Patient recently diagnosed with pulmonary embolism. She is on chronic Eloquis and has been compliant with this regimen.  Patient today as well and nontoxic appearing,  hemodynamically stable, and afebrile. Physical exam today is also unremarkable. Cardiac workup without ischemic changes on EKG. Chest x-ray without mediastinal widening, pneumothorax, or pleural effusion. Troponin x2 negative today. My suspicion for a ACS in this patient as well given age, pleuritic nature of symptoms, and reassuring work up. Heart Score 2 c/w low risk of ACS. Doubt aortic dissection. Patient has had complete resolution in symptoms with aspirin, morphine, and nitroglycerin.  Patient stable and appropriate for discharge at this time with instruction a followup with her primary care provider. Patient has been hypertensive during her ED stay. Will start patient on Norvasc daily. Return precautions discussed and provided. Patient agreeable to plan with no unaddressed concerns.  Antony Madura, [place] 10/21/13 [phone]

## 2013-10-21 NOTE — ED Notes (Addendum)
Pt was seen here on 8/21 with chest pain and SOB and was dx with a pulmonary embolis. Pt states she started having chest pain yesterday around 1600. States the pain has gotten worse and she became concerned about going to sleep. Pt states she has had weakness, lightheadedness and some dizziness. Pt denies SOB, n/v and diaphoresis.

## 2013-10-21 NOTE — Discharge Instructions (Signed)
Chest Pain (Nonspecific) °It is often hard to give a specific diagnosis for the cause of chest pain. There is always a chance that your pain could be related to something serious, such as a heart attack or a blood clot in the lungs. You need to follow up with your health care provider for further evaluation. °CAUSES  °· Heartburn. °· Pneumonia or bronchitis. °· Anxiety or stress. °· Inflammation around your heart (pericarditis) or lung (pleuritis or pleurisy). °· A blood clot in the lung. °· A collapsed lung (pneumothorax). It can develop suddenly on its own (spontaneous pneumothorax) or from trauma to the chest. °· Shingles infection (herpes zoster virus). °The chest wall is composed of bones, muscles, and cartilage. Any of these can be the source of the pain. °· The bones can be bruised by injury. °· The muscles or cartilage can be strained by coughing or overwork. °· The cartilage can be affected by inflammation and become sore (costochondritis). °DIAGNOSIS  °Lab tests or other studies may be needed to find the cause of your pain. Your health care provider may have you take a test called an ambulatory electrocardiogram (ECG). An ECG records your heartbeat patterns over a 24-hour period. You may also have other tests, such as: °· Transthoracic echocardiogram (TTE). During echocardiography, sound waves are used to evaluate how blood flows through your heart. °· Transesophageal echocardiogram (TEE). °· Cardiac monitoring. This allows your health care provider to monitor your heart rate and rhythm in real time. °· Holter monitor. This is a portable device that records your heartbeat and can help diagnose heart arrhythmias. It allows your health care provider to track your heart activity for several days, if needed. °· Stress tests by exercise or by giving medicine that makes the heart beat faster. °TREATMENT  °· Treatment depends on what may be causing your chest pain. Treatment may include: °· Acid blockers for  heartburn. °· Anti-inflammatory medicine. °· Pain medicine for inflammatory conditions. °· Antibiotics if an infection is present. °· You may be advised to change lifestyle habits. This includes stopping smoking and avoiding alcohol, caffeine, and chocolate. °· You may be advised to keep your head raised (elevated) when sleeping. This reduces the chance of acid going backward from your stomach into your esophagus. °Most of the time, nonspecific chest pain will improve within 2-3 days with rest and mild pain medicine.  °HOME CARE INSTRUCTIONS  °· If antibiotics were prescribed, take them as directed. Finish them even if you start to feel better. °· For the next few days, avoid physical activities that bring on chest pain. Continue physical activities as directed. °· Do not use any tobacco products, including cigarettes, chewing tobacco, or electronic cigarettes. °· Avoid drinking alcohol. °· Only take medicine as directed by your health care provider. °· Follow your health care provider's suggestions for further testing if your chest pain does not go away. °· Keep any follow-up appointments you made. If you do not go to an appointment, you could develop lasting (chronic) problems with pain. If there is any problem keeping an appointment, call to reschedule. °SEEK MEDICAL CARE IF:  °· Your chest pain does not go away, even after treatment. °· You have a rash with blisters on your chest. °· You have a fever. °SEEK IMMEDIATE MEDICAL CARE IF:  °· You have increased chest pain or pain that spreads to your arm, neck, jaw, back, or abdomen. °· You have shortness of breath. °· You have an increasing cough, or you cough   up blood. °· You have severe back or abdominal pain. °· You feel nauseous or vomit. °· You have severe weakness. °· You faint. °· You have chills. °This is an emergency. Do not wait to see if the pain will go away. Get medical help at once. Call your local emergency services (911 in [place] Do not drive  yourself to the hospital. °MAKE SURE YOU:  °· Understand these instructions. °· Will watch your condition. °· Will get help right away if you are not doing well or get worse. °Document Released: 11/22/2004 Document Revised: 02/17/2013 Document Reviewed: 09/18/2007 °ExitCare® Patient Information ©2015 [employer]. This information is not intended to replace advice given to you by your health care provider. Make sure you discuss any questions you have with your health care provider. ° °Hypertension °Hypertension, commonly called high blood pressure, is when the force of blood pumping through your arteries is too strong. Your arteries are the blood vessels that carry blood from your heart throughout your body. A blood pressure reading consists of a higher number over a lower number, such as 110/72. The higher number (systolic) is the pressure inside your arteries when your heart pumps. The lower number (diastolic) is the pressure inside your arteries when your heart relaxes. Ideally you want your blood pressure below 120/80. °Hypertension forces your heart to work harder to pump blood. Your arteries may become narrow or stiff. Having hypertension puts you at risk for heart disease, stroke, and other problems.  °RISK FACTORS °Some risk factors for high blood pressure are controllable. Others are not.  °Risk factors you cannot control include:  °· Race. You may be at higher risk if you are African American. °· Age. Risk increases with age. °· Gender. Men are at higher risk than women before age 45 years. After age 65, women are at higher risk than men. °Risk factors you can control include: °· Not getting enough exercise or physical activity. °· Being overweight. °· Getting too much fat, sugar, calories, or salt in your diet. °· Drinking too much alcohol. °SIGNS AND SYMPTOMS °Hypertension does not usually cause signs or symptoms. Extremely high blood pressure (hypertensive crisis) may cause headache, anxiety, shortness  of breath, and nosebleed. °DIAGNOSIS  °To check if you have hypertension, your health care provider will measure your blood pressure while you are seated, with your arm held at the level of your heart. It should be measured at least twice using the same arm. Certain conditions can cause a difference in blood pressure between your right and left arms. A blood pressure reading that is higher than normal on one occasion does not mean that you need treatment. If one blood pressure reading is high, ask your health care provider about having it checked again. °TREATMENT  °Treating high blood pressure includes making lifestyle changes and possibly taking medicine. Living a healthy lifestyle can help lower high blood pressure. You may need to change some of your habits. °Lifestyle changes may include: °· Following the DASH diet. This diet is high in fruits, vegetables, and whole grains. It is low in salt, red meat, and added sugars. °· Getting at least 2½ hours of brisk physical activity every week. °· Losing weight if necessary. °· Not smoking. °· Limiting alcoholic beverages. °· Learning ways to reduce stress. ° If lifestyle changes are not enough to get your blood pressure under control, your health care provider may prescribe medicine. You may need to take more than one. Work closely with your health care provider   to understand the risks and benefits. °HOME CARE INSTRUCTIONS °· Have your blood pressure rechecked as directed by your health care provider.   °· Take medicines only as directed by your health care provider. Follow the directions carefully. Blood pressure medicines must be taken as prescribed. The medicine does not work as well when you skip doses. Skipping doses also puts you at risk for problems.   °· Do not smoke.   °· Monitor your blood pressure at home as directed by your health care provider.  °SEEK MEDICAL CARE IF:  °· You think you are having a reaction to medicines taken. °· You have recurrent  headaches or feel dizzy. °· You have swelling in your ankles. °· You have trouble with your vision. °SEEK IMMEDIATE MEDICAL CARE IF: °· You develop a severe headache or confusion. °· You have unusual weakness, numbness, or feel faint. °· You have severe chest or abdominal pain. °· You vomit repeatedly. °· You have trouble breathing. °MAKE SURE YOU:  °· Understand these instructions. °· Will watch your condition. °· Will get help right away if you are not doing well or get worse. °Document Released: 02/12/2005 Document Revised: 06/29/2013 Document Reviewed: 12/05/2012 °ExitCare® Patient Information ©2015 [employer]. This information is not intended to replace advice given to you by your health care provider. Make sure you discuss any questions you have with your health care provider. ° °

## 2013-10-21 NOTE — ED Notes (Signed)
Pt given ham sandwich and orange juice. 

## 2013-10-21 NOTE — ED Provider Notes (Signed)
Medical screening examination/treatment/procedure(s) were performed by non-physician practitioner and as supervising physician I was immediately available for consultation/collaboration.   EKG Interpretation   Date/Time:  Wednesday October 21 2013 02:08:53 EDT Ventricular Rate:  81 PR Interval:  156 QRS Duration: 93 QT Interval:  348 QTC Calculation: 404 R Axis:   26 Text Interpretation:  Sinus rhythm No significant change since last  tracing Confirmed by Slyvia Lartigue  MD, Itzel Mckibbin ([id number]) on 10/21/2013 2:16:04 AM       Olivia Mackie, MD 10/21/13 [phone]

## 2013-10-27 LAB — HOMOCYSTEINE: HOMOCYSTEINE-NORM: 6.7 umol/L (ref 4.0–15.4)

## 2013-10-27 LAB — LUPUS ANTICOAGULANT PANEL
DRVVT: 27.1 s (ref <42.9)
Lupus Anticoagulant: NOT DETECTED
PTT Lupus Anticoagulant: 26.5 secs — ABNORMAL LOW (ref 28.0–43.0)

## 2013-10-27 LAB — CARDIOLIPIN ANTIBODIES, IGG, IGM, IGA
ANTICARDIOLIPIN IGA: 7 U/mL — AB (ref <22)
Anticardiolipin IgG: 3 GPL U/mL — ABNORMAL LOW (ref <23)
Anticardiolipin IgM: 3 MPL U/mL — ABNORMAL LOW (ref <11)

## 2013-10-27 LAB — BETA-2-GLYCOPROTEIN I ABS, IGG/M/A
BETA-2-GLYCOPROTEIN I IGA: 10 A Units (ref <20)
Beta-2 Glyco I IgG: 2 G Units (ref <20)
Beta-2-Glycoprotein I IgM: 6 M Units (ref <20)

## 2013-10-27 LAB — PROTEIN C, TOTAL: Protein C, Total: 73 % (ref 72–160)

## 2013-10-27 LAB — PROTEIN C ACTIVITY: Protein C Activity: 84 % (ref 75–133)

## 2013-10-27 LAB — PROTEIN S, TOTAL: PROTEIN S AG TOTAL: 104 % (ref 60–150)

## 2013-10-27 LAB — PROTEIN S ACTIVITY: Protein S Activity: 114 % (ref 69–129)

## 2013-11-13 ENCOUNTER — Encounter (HOSPITAL_COMMUNITY): Payer: Self-pay | Admitting: Emergency Medicine

## 2013-11-13 ENCOUNTER — Emergency Department (HOSPITAL_COMMUNITY)
Admission: EM | Admit: 2013-11-13 | Discharge: 2013-11-13 | Disposition: A | Discharge status: Home / Self Care | Payer: Medicaid Other | Source: Home / Self Care | Attending: Family Medicine | Admitting: Family Medicine

## 2013-11-13 DIAGNOSIS — H00019 Hordeolum externum unspecified eye, unspecified eyelid: Secondary | ICD-10-CM

## 2013-11-13 DIAGNOSIS — H00013 Hordeolum externum right eye, unspecified eyelid: Secondary | ICD-10-CM

## 2013-11-13 HISTORY — DX: Personal history of other venous thrombosis and embolism: Z86.718

## 2013-11-13 MED ORDER — TOBRAMYCIN 0.3 % OP SOLN
1.0000 [drp] | Freq: Four times a day (QID) | OPHTHALMIC | Status: DC
Start: 2013-11-13 — End: 2014-11-03

## 2013-11-13 NOTE — Discharge Instructions (Signed)
Warm cloth to eye before using drop in eye for 5 days, return as needed.

## 2013-11-13 NOTE — ED Notes (Signed)
Pt  Reports    r  Eye  Irritated   Watery   X  1  Day   denys  Any  Injury      Pt  Ambulatory  To  Room   In  No  Severe  Distress

## 2013-11-13 NOTE — ED Provider Notes (Signed)
CSN: [id number]     Arrival date & time 11/13/13  1008 History   First MD Initiated Contact with Patient 11/13/13 1022     No chief complaint on file.  (Consider location/radiation/quality/duration/timing/severity/associated sxs/prior Treatment) Patient is a 33 y.o. female presenting with eye problem. The history is provided by the patient.  Eye Problem Location:  R eye Quality:  Sharp Severity:  Mild Onset quality:  Gradual Duration:  12 hours Progression:  Worsening Chronicity:  New Context: not contact lens problem, not foreign body and not scratch   Relieved by:  None tried Worsened by:  Nothing tried Ineffective treatments:  Commercial eye wash Associated symptoms: discharge, redness and tearing   Associated symptoms: no blurred vision, no crusting, no decreased vision, no double vision, no foreign body sensation, no itching and no photophobia     Past Medical History  Diagnosis Date  . Hx of varicella   . Breast feeding status of mother   . Hypertension   . Pregnancy induced hypertension    Past Surgical History  Procedure Laterality Date  . No past surgeries     Family History  Problem Relation Age of Onset  . Asthma Mother   . Heart disease Mother   . Hypertension Mother   . Stroke Mother   . Cancer Father     prostate  . Alcohol abuse Neg Hx   . Arthritis Neg Hx   . Birth defects Neg Hx   . COPD Neg Hx   . Depression Neg Hx   . Diabetes Neg Hx   . Drug abuse Neg Hx   . Early death Neg Hx   . Hearing loss Neg Hx   . Hyperlipidemia Neg Hx   . Kidney disease Neg Hx   . Learning disabilities Neg Hx   . Mental illness Neg Hx   . Mental retardation Neg Hx   . Miscarriages / Stillbirths Neg Hx   . Vision loss Neg Hx   . Varicose Veins Neg Hx    History  Substance Use Topics  . Smoking status: Never Smoker   . Smokeless tobacco: Never Used  . Alcohol Use: Yes     Comment: occasionally   OB History   Grav Para Term Preterm Abortions TAB SAB Ect Mult  Living   Review of Systems  Constitutional: Negative.   HENT: Negative.   Eyes: Positive for discharge and redness. Negative for blurred vision, double vision, photophobia, pain, itching and visual disturbance.    Allergies  Penicillins  Home Medications   Prior to Admission medications   Medication Sig Start Date End Date Taking? Authorizing Provider  amLODipine (NORVASC) 5 MG tablet Take 1 tablet (5 mg total) by mouth daily. 10/21/13   Antony Madura, PA-C  apixaban (ELIQUIS) 5 MG TABS tablet Take 5 mg by mouth 2 (two) times daily.    Historical Provider, MD  Multiple Vitamin (MULTIVITAMIN WITH MINERALS) TABS tablet Take 1 tablet by mouth daily.    Historical Provider, MD  oxyCODONE-acetaminophen (PERCOCET/ROXICET) 5-325 MG per tablet Take 1 tablet by mouth every 4 (four) hours as needed for moderate pain or severe pain. 10/21/13   Antony Madura, PA-C  tobramycin (TOBREX) 0.3 % ophthalmic solution Place 1 drop into the right eye every 6 (six) hours. After warm soak to eye. 11/13/13   Linna Hoff, MD   BP 128/82  Pulse 87  Temp(Src) 98.5 F (  36.9 C) (Oral)  Resp 16  SpO2 98% Physical Exam  Nursing note and vitals reviewed. Constitutional: She is oriented to person, place, and time. She appears well-developed and well-nourished.  HENT:  Head: Normocephalic.  Right Ear: External ear normal.  Left Ear: External ear normal.  Mouth/Throat: Oropharynx is clear and moist.  Eyes: Conjunctivae and EOM are normal. Pupils are equal, round, and reactive to light. Lids are everted and swept, no foreign bodies found. Right eye exhibits discharge and exudate. No foreign body present in the right eye. Left eye exhibits no discharge. No scleral icterus.    Neck: Normal range of motion. Neck supple.  Neurological: She is alert and oriented to person, place, and time.  Skin: Skin is warm and dry.    ED Course  Procedures (including critical care time) Labs Review Labs  Reviewed - No data to display  Imaging Review No results found.   MDM   1. Stye external, right        Linna Hoff, MD 11/13/13 1055

## 2013-12-28 ENCOUNTER — Encounter (HOSPITAL_COMMUNITY): Payer: Self-pay | Admitting: Emergency Medicine

## 2014-02-24 ENCOUNTER — Emergency Department (HOSPITAL_COMMUNITY)
Admission: EM | Admit: 2014-02-24 | Discharge: 2014-02-24 | Disposition: A | Discharge status: Home / Self Care | Payer: Medicaid Other | Attending: Emergency Medicine | Admitting: Emergency Medicine

## 2014-02-24 ENCOUNTER — Encounter (HOSPITAL_COMMUNITY): Payer: Self-pay | Admitting: Emergency Medicine

## 2014-02-24 ENCOUNTER — Emergency Department (HOSPITAL_COMMUNITY): Payer: Medicaid Other

## 2014-02-24 DIAGNOSIS — Z880 Allergy status to penicillin: Secondary | ICD-10-CM | POA: Insufficient documentation

## 2014-02-24 DIAGNOSIS — Y9389 Activity, other specified: Secondary | ICD-10-CM | POA: Insufficient documentation

## 2014-02-24 DIAGNOSIS — W010XXA Fall on same level from slipping, tripping and stumbling without subsequent striking against object, initial encounter: Secondary | ICD-10-CM | POA: Insufficient documentation

## 2014-02-24 DIAGNOSIS — Z8619 Personal history of other infectious and parasitic diseases: Secondary | ICD-10-CM | POA: Diagnosis not present

## 2014-02-24 DIAGNOSIS — I10 Essential (primary) hypertension: Secondary | ICD-10-CM | POA: Insufficient documentation

## 2014-02-24 DIAGNOSIS — Z79899 Other long term (current) drug therapy: Secondary | ICD-10-CM | POA: Insufficient documentation

## 2014-02-24 DIAGNOSIS — S199XXA Unspecified injury of neck, initial encounter: Secondary | ICD-10-CM | POA: Insufficient documentation

## 2014-02-24 DIAGNOSIS — Y998 Other external cause status: Secondary | ICD-10-CM | POA: Insufficient documentation

## 2014-02-24 DIAGNOSIS — M542 Cervicalgia: Secondary | ICD-10-CM

## 2014-02-24 DIAGNOSIS — Y9289 Other specified places as the place of occurrence of the external cause: Secondary | ICD-10-CM | POA: Diagnosis not present

## 2014-02-24 MED ORDER — ACETAMINOPHEN 500 MG PO TABS
1000.0000 mg | ORAL_TABLET | Freq: Once | ORAL | Status: AC
  Administered 2014-02-24: 1000 mg via ORAL
  Filled 2014-02-24: qty 2

## 2014-02-24 NOTE — ED Notes (Signed)
Patient transported to X-ray 

## 2014-02-24 NOTE — ED Notes (Signed)
Pt states that she slipped coming [place] ramp this morning about 45 minutes ago.  States that she is now having rt sided neck stiffness and pain.  Pt in NAD.  No LOC.

## 2014-02-24 NOTE — Progress Notes (Signed)
[facility]  Address: [address] [place] [id number] Contact: [facility] Telephone: [phone]  EPIC updated

## 2014-02-24 NOTE — Discharge Instructions (Signed)
Follow-up with primary care doctor. Refer to resource guide below if you need help finding one. Return to the ER if you develop any weakness, numbness, worsening of pain, bowel or bladder incontinence.  Cervical Sprain A cervical sprain is an injury in the neck in which the strong, fibrous tissues (ligaments) that connect your neck bones stretch or tear. Cervical sprains can range from mild to severe. Severe cervical sprains can cause the neck vertebrae to be unstable. This can lead to damage of the spinal cord and can result in serious nervous system problems. The amount of time it takes for a cervical sprain to get better depends on the cause and extent of the injury. Most cervical sprains heal in 1 to 3 weeks. CAUSES  Severe cervical sprains may be caused by:   Contact sport injuries (such as from football, rugby, wrestling, hockey, auto racing, gymnastics, diving, martial arts, or boxing).   Motor vehicle collisions.   Whiplash injuries. This is an injury from a sudden forward and backward whipping movement of the head and neck.  Falls.  Mild cervical sprains may be caused by:   Being in an awkward position, such as while cradling a telephone between your ear and shoulder.   Sitting in a chair that does not offer proper support.   Working at a poorly Marketing executivedesigned computer station.   Looking up or down for long periods of time.  SYMPTOMS   Pain, soreness, stiffness, or a burning sensation in the front, back, or sides of the neck. This discomfort may develop immediately after the injury or slowly, 24 hours or more after the injury.   Pain or tenderness directly in the middle of the back of the neck.   Shoulder or upper back pain.   Limited ability to move the neck.   Headache.   Dizziness.   Weakness, numbness, or tingling in the hands or arms.   Muscle spasms.   Difficulty swallowing or chewing.   Tenderness and swelling of the neck.  DIAGNOSIS  Most of  the time your health care provider can diagnose a cervical sprain by taking your history and doing a physical exam. Your health care provider will ask about previous neck injuries and any known neck problems, such as arthritis in the neck. X-rays may be taken to find out if there are any other problems, such as with the bones of the neck. Other tests, such as a CT scan or MRI, may also be needed.  TREATMENT  Treatment depends on the severity of the cervical sprain. Mild sprains can be treated with rest, keeping the neck in place (immobilization), and pain medicines. Severe cervical sprains are immediately immobilized. Further treatment is done to help with pain, muscle spasms, and other symptoms and may include:  Medicines, such as pain relievers, numbing medicines, or muscle relaxants.   Physical therapy. This may involve stretching exercises, strengthening exercises, and posture training. Exercises and improved posture can help stabilize the neck, strengthen muscles, and help stop symptoms from returning.  HOME CARE INSTRUCTIONS   Put ice on the injured area.   Put ice in a plastic bag.   Place a towel between your skin and the bag.   Leave the ice on for 15-20 minutes, 3-4 times a day.   If your injury was severe, you may have been given a cervical collar to wear. A cervical collar is a two-piece collar designed to keep your neck from moving while it heals.  Do not remove the  collar unless instructed by your health care provider.  If you have long hair, keep it outside of the collar.  Ask your health care provider before making any adjustments to your collar. Minor adjustments may be required over time to improve comfort and reduce pressure on your chin or on the back of your head.  Ifyou are allowed to remove the collar for cleaning or bathing, follow your health care provider's instructions on how to do so safely.  Keep your collar clean by wiping it with mild soap and water  and drying it completely. If the collar you have been given includes removable pads, remove them every 1-2 days and hand wash them with soap and water. Allow them to air dry. They should be completely dry before you wear them in the collar.  If you are allowed to remove the collar for cleaning and bathing, wash and dry the skin of your neck. Check your skin for irritation or sores. If you see any, tell your health care provider.  Do not drive while wearing the collar.   Only take over-the-counter or prescription medicines for pain, discomfort, or fever as directed by your health care provider.   Keep all follow-up appointments as directed by your health care provider.   Keep all physical therapy appointments as directed by your health care provider.   Make any needed adjustments to your workstation to promote good posture.   Avoid positions and activities that make your symptoms worse.   Warm up and stretch before being active to help prevent problems.  SEEK MEDICAL CARE IF:   Your pain is not controlled with medicine.   You are unable to decrease your pain medicine over time as planned.   Your activity level is not improving as expected.  SEEK IMMEDIATE MEDICAL CARE IF:   You develop any bleeding.  You develop stomach upset.  You have signs of an allergic reaction to your medicine.   Your symptoms get worse.   You develop new, unexplained symptoms.   You have numbness, tingling, weakness, or paralysis in any part of your body.  MAKE SURE YOU:   Understand these instructions.  Will watch your condition.  Will get help right away if you are not doing well or get worse. Document Released: 12/10/2006 Document Revised: 02/17/2013 Document Reviewed: 08/20/2012 [facility] Patient Information 2015 [place], [place]. This information is not intended to replace advice given to you by your health care provider. Make sure you discuss any questions you have with your health  care provider.   Emergency Department Resource Guide 1) Find a Doctor and Pay Out of Pocket Although you won't have to find out who is covered by your insurance plan, it is a good idea to ask around and get recommendations. You will then need to call the office and see if the doctor you have chosen will accept you as a new patient and what types of options they offer for patients who are self-pay. Some doctors offer discounts or will set up payment plans for their patients who do not have insurance, but you will need to ask so you aren't surprised when you get to your appointment.  2) Contact Your Local Health Department Not all health departments have doctors that can see patients for sick visits, but many do, so it is worth a call to see if yours does. If you don't know where your local health department is, you can check in your phone book. The CDC also has a  tool to help you locate your state's health department, and many state websites also have listings of all of their local health departments.  3) Find a Walk-in Clinic If your illness is not likely to be very severe or complicated, you may want to try a walk in clinic. These are popping up all over the country in pharmacies, drugstores, and shopping centers. They're usually staffed by nurse practitioners or physician assistants that have been trained to treat common illnesses and complaints. They're usually fairly quick and inexpensive. However, if you have serious medical issues or chronic medical problems, these are probably not your best option.  No Primary Care Doctor: - Call Health Connect at  [phone] - they can help you locate a primary care doctor that  accepts your insurance, provides certain services, etc. - Physician Referral Service- [phone]  Chronic Pain Problems: Organization         Address  Phone   Notes  [facility]  [phone] Patients need to be referred by their primary care doctor.    Medication Assistance: Organization         Address  Phone   Notes  [facility] Medication [facility] [address] [place], [place] [address] [phone] --Must be a resident of [facility] -- Must have NO insurance coverage whatsoever (no Medicaid/ Medicare, etc.) -- The pt. MUST have a primary care doctor that directs their care regularly and follows them in the community   Med[facility]ist  [phone]   [employer]  [phone]    Agencies that provide inexpensive medical care: Organization         Address  Phone   Notes  [facility]  [phone]   [facility]    [phone]   [facility] [address] [place], [place] [address] [phone]   [facility] [address] [place] [phone]   Planned Parenthood    [phone]   [facility]    [phone]   [facility]  [address], [place] Phone:  [phone], Fax:  [phone] Hours of Operation:  9 am - 6 pm, M-F.  Also accepts Medicaid/Medicare and self-pay.  [facility]  [address], [place] Phone: [phone], Fax: [phone]. Hours of Operation:  8:30 am - 5:30 pm, M-F.  Also accepts Medicaid and self-pay.  [facility] [place] [address], [place] Phone: [phone]   [facility] [address] Natasha Bence [place], [place] [phone], Ext. [phone] Mondays & Thursdays: 7-9 AM.  First 15 patients are seen on a first come, first serve basis.    Medicaid-accepting [facility] Providers:  Organization         Address  Phone   Notes  [facility] [address], [place] [phone] Also accepts self-pay patients.  [facility] [address] Laurell Josephs [place], [place]  [phone]   [facility] [address], [place] [phone]   [facility] [address], [place] [phone]   Renaye Rakers [address], [place]   [phone] Only accepts [place] patients after they have their name applied to their card.   Self-Pay (no insurance) in [place]:  Organization         Address  Phone   Notes  Sickle Cell Patients, [facility] Internal Medicine [address] [place], [place] [phone]   [facility] [address] [place], [place] [phone]   [facility] [place]  [address], [place] [phone]   [facility]/Dr. Osei-Bonsu  [address], [place] or [address], [place] [phone] Phone number for both [place] and [place] locations is the same.  [facility] [address] [place] [phone]   [facility] [address], [place] or [address] [phone]   [facility] [address] [place] [phone], phone; [phone], fax Sees patients 1st and 3rd Saturday of every month.  Must not qualify for public or private insurance (i.e. Medicaid, Medicare, [place] Health Choice, Veterans' Benefits)  Household income should be no more than 200% of the poverty level The clinic cannot treat you if you are pregnant or think you are pregnant  Sexually transmitted diseases are not treated at the clinic.    Dental Care: Organization         Address  Phone  Notes  [facility] Department of [facility] [address] [place], [place] [phone] Accepts children up to age 84 who are enrolled in [place] or [place] Health Choice; pregnant women with a Medicaid card; and children who have applied for Medicaid or [place] Health Choice, but were declined, whose parents can pay a reduced fee at time of service.    [facility] Department of [facility]  [address], [place] [phone] Accepts children up to age 31 who are enrolled in [place] or [place] Health Choice; pregnant women with a Medicaid card; and children who have applied for Medicaid or  Health Choice, but were declined, whose parents can pay a reduced fee at time of service.  [facility]  [address] [place], [place] [phone] Patients are seen by appointment only. Walk-ins are not accepted. [facility] will see patients 81 years of age and older. Monday - Tuesday (8am-5pm) Most Wednesdays (8:30-5pm) $30 per visit, cash only  [facility]  [address], [facility] [phone] Patients are seen by appointment only. Walk-ins are not accepted. [facility] will see patients 49 years of age and older. One Wednesday Evening (Monthly: Volunteer Based).  $30 per visit, cash only  [employer]  [phone] for adults; Children under age 45, call Graduate Pediatric Dentistry at [phone]. Children aged 76-14, please call [phone] to request a pediatric application.  Dental services are provided in all areas of dental care including fillings, crowns and bridges, complete and partial dentures, implants, gum treatment, root canals, and extractions. Preventive care is also provided. Treatment is provided to both adults and children. Patients are selected via a lottery and there is often a waiting list.   [facility] [address], [place]  [phone] [email or web address]   [facility] [address] [place], [place] [phone], Ext. [phone] Second and Fourth Thursday of each month, opens at 6:30 AM; Clinic ends at 9 AM.  Patients are seen on a first-come first-served basis, and a limited number are seen during each clinic.  [facility]  [address], [place], [place] [phone]  [phone]   Eligibility Requirements You must have lived in [place], [place], or [place] for at least the last three months.   You cannot be eligible for state or federal sponsored National Cityhealthcare insurance, including CIGNAVeterans Administration, [place]icaid, or [employer]icare.   You generally cannot be eligible for healthcare insurance through your employer.    How to apply: Eligibility screenings are held every Tuesday and Wednesday afternoon from 1:00 pm until 4:00 pm. You do not need an appointment for the interview!  [facility] [address], [place], [place] [phone]   [facility]  [phone]   [facility]  [phone]   [facility]  [phone]    Behavioral Health Resources in the Community: Intensive Outpatient Programs Organization         Address  Phone  Notes  [facility] [address], [place], [place] [phone]   [facility] [address], [place], [place] [phone]   [facility] [address], [place], [place]  [phone]   [facility] [address],  [place], [place] [phone] or [phone]   Substance Abuse Resources Organization         Address  Phone  Notes  [facility]  [phone]   [facility]  [phone]   [facility]  [phone]   [facility]  [phone]   Residential & Outpatient Substance Abuse Program  [phone]   Psychological Services Organization         Address  Phone  Notes  [facility]  [phone]- [phone]   [facility]  [phone]- [phone]   [facility] [address], [place] [phone] or [phone]    Mobile Crisis Teams Organization         Address  Phone  Notes  [facility], Mobile Crisis Care Unit  [phone]   [facility]  [address] [place], [place] [phone]   Doristine LocksSharon DeEsch [address] [place] [phone]    Self-Help/Support Groups Organization         Address  Phone             Notes  [facility] [place] - variety of support groups  [phone] Call for more information  Narcotics Anonymous (NA), [facility] [address], [place]  2 meetings at this location   Statisticianesidential Treatment Programs Organization         Address  Phone  Notes  [facility] [address],    [place]  [phone]   [facility]  [address], [place] [phone], [place], [place] [phone]   [facility] [address], [place] [phone] Admissions: 8am-3pm M-F  [facility] [address]    [place], [place] [phone]   [facility] [address], [place], [place] [phone]   [facility] [address]  [place], [place] [phone]   [facility] - Intensive Outpatient [address], [place], [place] [phone]   [facility] [address]  [place], [place] [phone] or [phone]   [facility] ([facility]) [address] [place], [place] [phone] Accepts Medicaid  [facility] [address]  [place] [phone] Substance Abuse/Addiction  Treatment   [facility] Organization         Address  Phone  Notes  [facility]  [phone]   Angie Fava, PhD [address] Ervin Knack [place], [place]   [phone] or [phone]   [facility]   [address] [place], [place] [phone]   [facility] [address] [place], [place] [phone] Insurance/Medicaid/sponsorship through [facility] Families [address]                                    [place], [place] [phone]  Therapy/tele-psych/case  [facility] [address], [place] [phone]    Dr. Lolly Mustache  [phone]   [facility] [place]  [employer] [facility] Dept. 1) [address], [place] 2) [address], [place] 3)  [address], [place] [phone]  [phone]   [facility] Child Abuse Hotline 8470973201 or [phone] (After Hours)

## 2014-02-24 NOTE — ED Provider Notes (Signed)
CSN: [id number]     Arrival date & time 02/24/14  0910 History   First MD Initiated Contact with Patient 02/24/14 0957     Chief Complaint  Patient presents with  . Fall  . Neck Pain     (Consider location/radiation/quality/duration/timing/severity/associated sxs/prior Treatment) HPI [name] is a 33 year old female with past medical history of hypertension, PE currently on Elequis who presents the ER complaining of neck pain status post fall. Patient states she was walking at her house this morning, slipped and fell on her gluteal region on the ramp proximally 45 minutes before coming to the ER. Patient reports having right-sided neck pain immediately after the event. Patient reports a dull ache, soreness in her right upper trapezius region, with mild pain with range of motion. Patient denies hitting her head, neck. Patient denies loss of consciousness, blurred vision, headache, nausea, vomiting, weakness, numbness, tingling.  Past Medical History  Diagnosis Date  . Hx of varicella   . Breast feeding status of mother   . Hypertension   . Pregnancy induced hypertension   . Hx of blood clots    Past Surgical History  Procedure Laterality Date  . No past surgeries     Family History  Problem Relation Age of Onset  . Asthma Mother   . Heart disease Mother   . Hypertension Mother   . Stroke Mother   . Cancer Father     prostate  . Alcohol abuse Neg Hx   . Arthritis Neg Hx   . Birth defects Neg Hx   . COPD Neg Hx   . Depression Neg Hx   . Diabetes Neg Hx   . Drug abuse Neg Hx   . Early death Neg Hx   . Hearing loss Neg Hx   . Hyperlipidemia Neg Hx   . Kidney disease Neg Hx   . Learning disabilities Neg Hx   . Mental illness Neg Hx   . Mental retardation Neg Hx   . Miscarriages / Stillbirths Neg Hx   . Vision loss Neg Hx   . Varicose Veins Neg Hx    History  Substance Use Topics  . Smoking status: Never Smoker   . Smokeless tobacco: Never Used  . Alcohol  Use: Yes     Comment: occasionally   OB History    Gravida Para Term Preterm AB TAB SAB Ectopic Multiple Living   6 6 5 1      6      Review of Systems  Musculoskeletal: Positive for arthralgias and neck pain. Negative for back pain and gait problem.  Neurological: Negative for weakness and numbness.      Allergies  Penicillins  Home Medications   Prior to Admission medications   Medication Sig Start Date End Date Taking? Authorizing Provider  amLODipine (NORVASC) 5 MG tablet Take 1 tablet (5 mg total) by mouth daily. 10/21/13   Antony MaduraKelly Humes, PA-C  apixaban (ELIQUIS) 5 MG TABS tablet Take 5 mg by mouth 2 (two) times daily.    Historical Provider, MD  Multiple Vitamin (MULTIVITAMIN WITH MINERALS) TABS tablet Take 1 tablet by mouth daily.    Historical Provider, MD  oxyCODONE-acetaminophen (PERCOCET/ROXICET) 5-325 MG per tablet Take 1 tablet by mouth every 4 (four) hours as needed for moderate pain or severe pain. 10/21/13   Antony MaduraKelly Humes, PA-C  tobramycin (TOBREX) 0.3 % ophthalmic solution Place 1 drop into the right eye every 6 (six) hours. After warm soak to eye. 11/13/13  Linna Hoff, MD   BP 138/88 mmHg  Pulse 72  Temp(Src) 98.2 F (36.8 C) (Oral)  Resp 16  SpO2 99%  LMP 01/25/2014 Physical Exam  Constitutional: She is oriented to person, place, and time. She appears well-developed and well-nourished. No distress.  HENT:  Head: Normocephalic and atraumatic.  Eyes: Right eye exhibits no discharge. Left eye exhibits no discharge. No scleral icterus.  Neck: Normal range of motion. Neck supple. Muscular tenderness present. No spinous process tenderness present. No rigidity. Normal range of motion present.    Pulmonary/Chest: Effort normal. No respiratory distress.  Musculoskeletal: Normal range of motion.  Neurological: She is alert and oriented to person, place, and time. She has normal strength. No cranial nerve deficit or sensory deficit. Gait normal. GCS eye subscore is 4.  GCS verbal subscore is 5. GCS motor subscore is 6.  Patient fully alert answering questions appropriately in full, clear sentences. Cranial nerves II through XII grossly intact. Motor strength 5 out of 5 in all major muscle groups of upper and lower extremities. Distal sensation intact.  Skin: Skin is warm and dry. She is not diaphoretic.  Psychiatric: She has a normal mood and affect.  Nursing note and vitals reviewed.   ED Course  Procedures (including critical care time) Labs Review Labs Reviewed - No data to display  Imaging Review Dg Cervical Spine Complete  02/24/2014   CLINICAL DATA:  Patient slipped on wet surface and fell. Pain right side of neck  EXAM: CERVICAL SPINE  4+ VIEWS  COMPARISON:  None.  FINDINGS: Frontal, lateral, open-mouth odontoid, and bilateral oblique views were obtained. There is no fracture or spondylolisthesis. Prevertebral soft tissues and predental space regions are normal. Disc spaces appear intact. There is calcification in the anterior ligament at C5-6. There is no appreciable exit foraminal narrowing on the oblique views. There is reversal of lordotic curvature.  IMPRESSION: Reversal of lordotic curvature. This finding is most likely due to muscle spasm. If there is concern for ligamentous injury, however, lateral flexion-extension views could be helpful to further evaluate. No fracture or spondylolisthesis. No appreciable arthropathic change.   Electronically Signed   By: Bretta Bang M.D.   On: 02/24/2014 10:40     EKG Interpretation None      MDM   Final diagnoses:  Neck pain    Patient here for neck pain status post fall. No obvious signs of injury. Patient complaining of lateral neck pain, denying hitting her head or neck. Patient states she landed on her gluteal region when she fell in a sitting position. Neuro exam benign, no focal neurologic deficit. No signs or symptoms consistent with radiculopathy, however due to traumatic nature of her  pain, we will follow-up with neck radiographs.  Neck radiographs with impression: Reversal of lordotic curvature. This finding is most likely due to muscle spasm. If there is concern for ligamentous injury, however, lateral flexion-extension views could be helpful to further evaluate. No fracture or spondylolisthesis. No appreciable arthropathic change.  With clinical correlation of radiographs there is no concern for ligamentous injury based on patient's mechanism of injury, history and physical exam. We will discharge patient this time and encourage use of Tylenol for pain due to the fact the patient is on apixaban. I strongly encouraged patient to follow up with a primary care provider, and provided her a resource guide to help her find one. I encouraged patient use heat on her neck, use basic range of motion exercises to help her  neck pain. I discussed return precautions with patient and patient was agreeable to this plan. I encouraged patient to call or return to the ER should she have any questions or concerns.  BP 138/88 mmHg  Pulse 72  Temp(Src) 98.2 F (36.8 C) (Oral)  Resp 16  SpO2 99%  LMP 01/25/2014  Signed,  Ladona MowJoe Lekeisha Arenas, PA-C 4:42 PM    Monte FantasiaJoseph W Fynn Adel, PA-C 02/24/14 1642  Lyanne CoKevin M Campos, MD 02/25/14 [phone]

## 2014-02-26 NOTE — L&D Delivery Note (Signed)
Pt checked after epidural and noted to be complete with a bulging bag. Bag ruptured and copious amount of clear fluid noted.  %mins later pt with uncontrollable urge to push. Pt pushed twice to deliver a viable female infant in LOA position over intact perineum. Anterior and posterior shoulders spontaneously delivered with next push; body easily followed next. Infant placed on mothers abdomen with vigorous spontaneous cry and bulb suction of mouth and nose performed. Cord was then clamped and cut by FOB. Cord blood obtained. Placenta then delivered about 5 mins later intact, 3VC shultz. Fundal massage performed and pitocin per protocol. Fundus firm. Vaginal inspection confirmed no lacs. Mother and baby stable. Counts correct. Apgars 9 and 9

## 2014-05-10 ENCOUNTER — Emergency Department (HOSPITAL_COMMUNITY)
Admission: EM | Admit: 2014-05-10 | Discharge: 2014-05-11 | Disposition: A | Discharge status: Home / Self Care | Payer: 59 | Attending: Emergency Medicine | Admitting: Emergency Medicine

## 2014-05-10 ENCOUNTER — Encounter (HOSPITAL_COMMUNITY): Payer: Self-pay | Admitting: Emergency Medicine

## 2014-05-10 DIAGNOSIS — Z79899 Other long term (current) drug therapy: Secondary | ICD-10-CM | POA: Diagnosis not present

## 2014-05-10 DIAGNOSIS — Z86718 Personal history of other venous thrombosis and embolism: Secondary | ICD-10-CM | POA: Diagnosis not present

## 2014-05-10 DIAGNOSIS — Z8619 Personal history of other infectious and parasitic diseases: Secondary | ICD-10-CM | POA: Insufficient documentation

## 2014-05-10 DIAGNOSIS — Z880 Allergy status to penicillin: Secondary | ICD-10-CM | POA: Diagnosis not present

## 2014-05-10 DIAGNOSIS — R079 Chest pain, unspecified: Secondary | ICD-10-CM | POA: Insufficient documentation

## 2014-05-10 DIAGNOSIS — R002 Palpitations: Secondary | ICD-10-CM | POA: Diagnosis present

## 2014-05-10 DIAGNOSIS — I10 Essential (primary) hypertension: Secondary | ICD-10-CM | POA: Diagnosis not present

## 2014-05-10 DIAGNOSIS — Z7901 Long term (current) use of anticoagulants: Secondary | ICD-10-CM | POA: Diagnosis not present

## 2014-05-10 LAB — I-STAT TROPONIN, ED: TROPONIN I, POC: 0 ng/mL (ref 0.00–0.08)

## 2014-05-10 MED ORDER — SODIUM CHLORIDE 0.9 % IV BOLUS (SEPSIS)
1000.0000 mL | Freq: Once | INTRAVENOUS | Status: AC
  Administered 2014-05-11: 1000 mL via INTRAVENOUS

## 2014-05-10 NOTE — ED Provider Notes (Signed)
CSN: [id number]     Arrival date & time 05/10/14  2239 History   First MD Initiated Contact with Patient 05/10/14 2331     Chief Complaint  Patient presents with  . Palpitations     (Consider location/radiation/quality/duration/timing/severity/associated sxs/prior Treatment) HPI   Mindy Wade is a 34 y.o. female with past medical history of pulmonary embolism presenting today with chest pain. She states she had sudden onset of palpitations, chest pressure and tightness after cooking at home. She denies emesis or diaphoresis. She had mild shortness of breath. She states her symptoms are similar to her last pulmonary embolism however they're not as severe. She recently completed a course of Eliquis that ended at the end of February. Her pain is midsternal without radiation. Nothing currently makes the symptoms better or worse. She denies any recent infections, coughing or fevers. She also states she has had cramping in her right lower extremity near her calf muscle. She denies any blood clots in her lower extremity in the past. Patient has no further complaints.  Past Medical History  Diagnosis Date  . Hx of varicella   . Breast feeding status of mother   . Hypertension   . Pregnancy induced hypertension   . Hx of blood clots    Past Surgical History  Procedure Laterality Date  . No past surgeries     Family History  Problem Relation Age of Onset  . Asthma Mother   . Heart disease Mother   . Hypertension Mother   . Stroke Mother   . Cancer Father     prostate  . Alcohol abuse Neg Hx   . Arthritis Neg Hx   . Birth defects Neg Hx   . COPD Neg Hx   . Depression Neg Hx   . Diabetes Neg Hx   . Drug abuse Neg Hx   . Early death Neg Hx   . Hearing loss Neg Hx   . Hyperlipidemia Neg Hx   . Kidney disease Neg Hx   . Learning disabilities Neg Hx   . Mental illness Neg Hx   . Mental retardation Neg Hx   . Miscarriages / Stillbirths Neg Hx   . Vision loss Neg Hx   .  Varicose Veins Neg Hx    History  Substance Use Topics  . Smoking status: Never Smoker   . Smokeless tobacco: Never Used  . Alcohol Use: Yes     Comment: occasionally   OB History    Gravida Para Term Preterm AB TAB SAB Ectopic Multiple Living   6 6 5 1      6      Review of Systems    Allergies  Penicillins  Home Medications   Prior to Admission medications   Medication Sig Start Date End Date Taking? Authorizing Provider  amLODipine (NORVASC) 5 MG tablet Take 1 tablet (5 mg total) by mouth daily. Patient not taking: Reported on 05/10/2014 10/21/13   Antony MaduraKelly Humes, PA-C  apixaban (ELIQUIS) 5 MG TABS tablet Take 5 mg by mouth 2 (two) times daily.    Historical Provider, MD  Multiple Vitamin (MULTIVITAMIN WITH MINERALS) TABS tablet Take 1 tablet by mouth daily.    Historical Provider, MD  oxyCODONE-acetaminophen (PERCOCET/ROXICET) 5-325 MG per tablet Take 1 tablet by mouth every 4 (four) hours as needed for moderate pain or severe pain. Patient not taking: Reported on 05/10/2014 10/21/13   Antony MaduraKelly Humes, PA-C  tobramycin (TOBREX) 0.3 % ophthalmic solution Place 1 drop into the  right eye every 6 (six) hours. After warm soak to eye. Patient not taking: Reported on 05/10/2014 11/13/13   Linna Hoff, MD   BP 150/106 mmHg  Pulse 77  Temp(Src) 98.3 F (36.8 C) (Oral)  Resp 22  Ht  (1.727 m)  Wt 266 lb (120.657 kg)  BMI 40.45 kg/m2  SpO2 100%  LMP 04/26/2014 Physical Exam  Constitutional: She is oriented to person, place, and time. She appears well-developed and well-nourished. No distress.  HENT:  Head: Normocephalic and atraumatic.  Nose: Nose normal.  Mouth/Throat: Oropharynx is clear and moist. No oropharyngeal exudate.  Eyes: Conjunctivae and EOM are normal. Pupils are equal, round, and reactive to light. No scleral icterus.  Neck: Normal range of motion. Neck supple. No JVD present. No tracheal deviation present. No thyromegaly present.  Cardiovascular: Normal rate,  regular rhythm and normal heart sounds.  Exam reveals no gallop and no friction rub.   No murmur heard. Pulmonary/Chest: Effort normal and breath sounds normal. No respiratory distress. She has no wheezes. She exhibits no tenderness.  Abdominal: Soft. Bowel sounds are normal. She exhibits no distension and no mass. There is no tenderness. There is no rebound and no guarding.  Musculoskeletal: Normal range of motion. She exhibits no edema or tenderness.  Lymphadenopathy:    She has no cervical adenopathy.  Neurological: She is alert and oriented to person, place, and time. No cranial nerve deficit. She exhibits normal muscle tone.  Skin: Skin is warm and dry. No rash noted. She is not diaphoretic. No erythema. No pallor.  Nursing note and vitals reviewed.   ED Course  Procedures (including critical care time) Labs Review Labs Reviewed  BASIC METABOLIC PANEL - Abnormal; Notable for the following:    Potassium 3.3 (*)    Glucose, Bld 124 (*)    All other components within normal limits  CBC  I-STAT TROPOININ, ED    Imaging Review Ct Angio Chest Pe W/cm &/or Wo Cm  05/11/2014   CLINICAL DATA:  Acute onset of heart palpitations and chest tightness. Sensation of lump in throat. Initial encounter.  EXAM: CT ANGIOGRAPHY CHEST WITH CONTRAST  TECHNIQUE: Multidetector CT imaging of the chest was performed using the standard protocol during bolus administration of intravenous contrast. Multiplanar CT image reconstructions and MIPs were obtained to evaluate the vascular anatomy.  CONTRAST:  OMNIPAQUE IOHEXOL 350 MG/ML SOLN  COMPARISON:  CTA of the chest performed 10/17/2013  FINDINGS: There is no evidence of pulmonary embolus.  Minimal left basilar atelectasis is noted. The lungs are otherwise clear. There is no evidence of significant focal consolidation, pleural effusion or pneumothorax. No masses are identified; no abnormal focal contrast enhancement is seen.  The mediastinum is unremarkable in  appearance. No mediastinal lymphadenopathy is seen. No pericardial effusion is identified. The great vessels are grossly unremarkable in appearance. No axillary lymphadenopathy is seen. The visualized portions of the thyroid gland are unremarkable in appearance.  The visualized portions of the liver and spleen are unremarkable.  No acute osseous abnormalities are seen.  Review of the MIP images confirms the above findings.  IMPRESSION: 1. No evidence of pulmonary embolus. 2. Minimal left basilar atelectasis noted; lungs otherwise clear.   Electronically Signed   By: Roanna Raider M.D.   On: 05/11/2014 02:27     EKG Interpretation   Date/Time:  Monday May 10 2014 22:50:13 EDT Ventricular Rate:  74 PR Interval:  167 QRS Duration: 95 QT Interval:  369 QTC Calculation:  409 R Axis:   35 Text Interpretation:  Sinus rhythm Borderline T wave abnormalities No  significant change since last tracing Confirmed by Erroll Luna  [phone]) on 05/10/2014 11:31:01 PM      MDM   Final diagnoses:  Chest pain    Patient does emergency department for chest pain is similar to her prior pulmonary embolus and. In addition she states she has had cramping in her right lower extremity. She is at high risk for PE. Will evaluate with CT scan of the chest. Patient was given IV fluids as well in the emergency department. Regardless of CT findings, patient will at least need lovenox to cover for DVT and obtain and Ultrasound study in the morning.    CT scan is negative for pulmonary embolism. Patient was given Lovenox emergency department for possible DVT. Return instructions were provided for radiology study tomorrow. At this time her vital signs remain within her normal limits, she is asymptomatic from a high blood pressure standpoint, patient safe for discharge.    Tomasita Crumble, MD 05/11/14 [phone]

## 2014-05-10 NOTE — ED Notes (Signed)
Pt states that she started having heart palpitations and tightness in her chest tonight and states it feels like she has a "lump" in her throat. Hx of same feeling when she had a PE in August. Recently finished taking 6 months of eliquis for PE. Alert and oriented. Denies SOB.

## 2014-05-11 ENCOUNTER — Emergency Department (HOSPITAL_COMMUNITY): Payer: 59

## 2014-05-11 ENCOUNTER — Ambulatory Visit (HOSPITAL_COMMUNITY)
Admission: RE | Admit: 2014-05-11 | Discharge: 2014-05-11 | Disposition: A | Discharge status: Home / Self Care | Payer: 59 | Source: Ambulatory Visit | Attending: Emergency Medicine | Admitting: Emergency Medicine

## 2014-05-11 ENCOUNTER — Encounter (HOSPITAL_COMMUNITY): Payer: Self-pay

## 2014-05-11 DIAGNOSIS — I2699 Other pulmonary embolism without acute cor pulmonale: Secondary | ICD-10-CM | POA: Diagnosis not present

## 2014-05-11 DIAGNOSIS — R079 Chest pain, unspecified: Secondary | ICD-10-CM | POA: Diagnosis not present

## 2014-05-11 LAB — CBC
HCT: 37.6 % (ref 36.0–46.0)
Hemoglobin: 12.1 g/dL (ref 12.0–15.0)
MCH: 28.5 pg (ref 26.0–34.0)
MCHC: 32.2 g/dL (ref 30.0–36.0)
MCV: 88.7 fL (ref 78.0–100.0)
PLATELETS: 319 10*3/uL (ref 150–400)
RBC: 4.24 MIL/uL (ref 3.87–5.11)
RDW: 14.7 % (ref 11.5–15.5)
WBC: 7.8 10*3/uL (ref 4.0–10.5)

## 2014-05-11 LAB — BASIC METABOLIC PANEL
ANION GAP: 9 (ref 5–15)
BUN: 12 mg/dL (ref 6–23)
CHLORIDE: 106 mmol/L (ref 96–112)
CO2: 23 mmol/L (ref 19–32)
Calcium: 8.8 mg/dL (ref 8.4–10.5)
Creatinine, Ser: 0.73 mg/dL (ref 0.50–1.10)
Glucose, Bld: 124 mg/dL — ABNORMAL HIGH (ref 70–99)
Potassium: 3.3 mmol/L — ABNORMAL LOW (ref 3.5–5.1)
Sodium: 138 mmol/L (ref 135–145)

## 2014-05-11 MED ORDER — IOHEXOL 350 MG/ML SOLN
100.0000 mL | Freq: Once | INTRAVENOUS | Status: AC | PRN
  Administered 2014-05-11: 100 mL via INTRAVENOUS

## 2014-05-11 MED ORDER — POTASSIUM CHLORIDE CRYS ER 20 MEQ PO TBCR
40.0000 meq | EXTENDED_RELEASE_TABLET | Freq: Once | ORAL | Status: AC
  Administered 2014-05-11: 40 meq via ORAL
  Filled 2014-05-11: qty 2

## 2014-05-11 MED ORDER — ENOXAPARIN SODIUM 120 MG/0.8ML ~~LOC~~ SOLN
1.0000 mg/kg | Freq: Once | SUBCUTANEOUS | Status: AC
  Administered 2014-05-11: 120 mg via SUBCUTANEOUS
  Filled 2014-05-11: qty 0.8

## 2014-05-11 NOTE — Progress Notes (Signed)
VASCULAR LAB PRELIMINARY  PRELIMINARY  PRELIMINARY  PRELIMINARY  Right lower extremity venous duplex completed.    Preliminary report:  Right:  No evidence of DVT, superficial thrombosis, or Baker's cyst.  Raffael Bugarin, RVS 05/11/2014, 7:03 PM

## 2014-05-11 NOTE — ED Notes (Signed)
Pt resting quietly with eyes closed. Appears in no acute distress.  

## 2014-05-11 NOTE — Discharge Instructions (Signed)
Chest Pain (Nonspecific) Ms. [place], your CT did not show a blood clot. You may however have a blood clot in your legs. Your given a blood thinner in the emergency department, you need to come back to the radiology department at [facility] for an ultrasound study, Tuesday morning at 7am. Your blood pressure was very high today (165/107), follow-up with her primary care physician within 3 days for management. If any symptoms worsen come back to emergency department immediately. Thank you. It is often hard to give a diagnosis for the cause of chest pain. There is always a chance that your pain could be related to something serious, such as a heart attack or a blood clot in the lungs. You need to follow up with your doctor. HOME CARE  If antibiotic medicine was given, take it as directed by your doctor. Finish the medicine even if you start to feel better.  For the next few days, avoid activities that bring on chest pain. Continue physical activities as told by your doctor.  Do not use any tobacco products. This includes cigarettes, chewing tobacco, and e-cigarettes.  Avoid drinking alcohol.  Only take medicine as told by your doctor.  Follow your doctor's suggestions for more testing if your chest pain does not go away.  Keep all doctor visits you made. GET HELP IF:  Your chest pain does not go away, even after treatment.  You have a rash with blisters on your chest.  You have a fever. GET HELP RIGHT AWAY IF:   You have more pain or pain that spreads to your arm, neck, jaw, back, or belly (abdomen).  You have shortness of breath.  You cough more than usual or cough up blood.  You have very bad back or belly pain.  You feel sick to your stomach (nauseous) or throw up (vomit).  You have very bad weakness.  You pass out (faint).  You have chills. This is an emergency. Do not wait to see if the problems will go away. Call your local emergency services (911 in [place] Do not drive  yourself to the hospital. MAKE SURE YOU:   Understand these instructions.  Will watch your condition.  Will get help right away if you are not doing well or get worse. Document Released: 08/01/2007 Document Revised: 02/17/2013 Document Reviewed: 08/01/2007 [facility] Patient Information 2015 [place], [place]. This information is not intended to replace advice given to you by your health care provider. Make sure you discuss any questions you have with your health care provider. Leg Cramps Leg cramps that occur during exercise can be caused by poor circulation or dehydration. However, muscle cramps that occur at rest or during the night are usually not due to any serious medical problem. Heat cramps may cause muscle spasms during hot weather.  CAUSES There is no clear cause for muscle cramps. However, dehydration may be a factor for those who do not drink enough fluids and those who exercise in the heat. Imbalances in the level of sodium, potassium, calcium or magnesium in the muscle tissue may also be a factor. Some medications, such as water pills (diuretics), may cause loss of chemicals that the body needs (like sodium and potassium) and cause muscle cramps. TREATMENT   Make sure your diet has enough fluids and essential minerals for the muscle to work normally.  Avoid strenuous exercise for several days if you have been having frequent leg cramps.  Stretch and massage the cramped muscle for several minutes.  Some  medicines may be helpful in some patients with night cramps. Only take over-the-counter or prescription medicines as directed by your caregiver. SEEK IMMEDIATE MEDICAL CARE IF:   Your leg cramps become worse.  Your foot becomes cold, numb, or blue. Document Released: 03/22/2004 Document Revised: 05/07/2011 Document Reviewed: 03/09/2008 [facility] Patient Information 2015 [employer] [place]. This information is not intended to replace advice given to you by your health care provider.  Make sure you discuss any questions you have with your health care provider.

## 2014-05-13 ENCOUNTER — Ambulatory Visit (INDEPENDENT_AMBULATORY_CARE_PROVIDER_SITE_OTHER): Payer: 59 | Admitting: Family Medicine

## 2014-05-13 VITALS — BP 156/94 | HR 72 | Temp 98.0°F | Resp 18 | Ht 68.0 in | Wt 278.0 lb

## 2014-05-13 DIAGNOSIS — R079 Chest pain, unspecified: Secondary | ICD-10-CM | POA: Diagnosis not present

## 2014-05-13 DIAGNOSIS — Z3169 Encounter for other general counseling and advice on procreation: Secondary | ICD-10-CM | POA: Diagnosis not present

## 2014-05-13 DIAGNOSIS — Z13220 Encounter for screening for lipoid disorders: Secondary | ICD-10-CM | POA: Diagnosis not present

## 2014-05-13 DIAGNOSIS — R7309 Other abnormal glucose: Secondary | ICD-10-CM | POA: Diagnosis not present

## 2014-05-13 DIAGNOSIS — I10 Essential (primary) hypertension: Secondary | ICD-10-CM

## 2014-05-13 LAB — LIPID PANEL
Cholesterol: 222 mg/dL — ABNORMAL HIGH (ref 0–200)
HDL: 61 mg/dL (ref >=46)
LDL Cholesterol: 149 mg/dL — ABNORMAL HIGH (ref 0–99)
TRIGLYCERIDES: 58 mg/dL (ref <150)
Total CHOL/HDL Ratio: 3.6 Ratio
VLDL: 12 mg/dL (ref 0–40)

## 2014-05-13 LAB — POCT GLYCOSYLATED HEMOGLOBIN (HGB A1C): Hemoglobin A1C: 5.7

## 2014-05-13 MED ORDER — AMLODIPINE BESYLATE 5 MG PO TABS: 10.0000 mg | ORAL_TABLET | Freq: Every day | ORAL | Status: DC

## 2014-05-13 NOTE — Progress Notes (Signed)
Subjective:    Patient ID: Mindy Wade, female    DOB: 05/14/1980, 34 y.o.   MRN: [id number]  Chief Complaint  Patient presents with  . Hypertension    on going issue needs pcp   . Chest Pain    off and x2 mths    Patient Active Problem List   Diagnosis Date Noted  . Pulmonary embolism 10/17/2013  . Pulmonary embolus 10/17/2013  . Labor and delivery indication for care or intervention 10/09/2013  . Spontaneous abortion in second trimester 10/09/2013  . Pregnancy 03/02/2013  . SVD (spontaneous vaginal delivery) 03/02/2013   Prior to Admission medications   Medication Sig Start Date End Date Taking? Authorizing Provider  amLODipine (NORVASC) 5 MG tablet Take 2 tablets (10 mg total) by mouth daily. 05/13/14  Yes Huey Bienenstock Deara Bober, PA  apixaban (ELIQUIS) 5 MG TABS tablet Take 5 mg by mouth 2 (two) times daily.   Yes Historical Provider, MD  Multiple Vitamin (MULTIVITAMIN WITH MINERALS) TABS tablet Take 1 tablet by mouth daily.   Yes Historical Provider, MD  oxyCODONE-acetaminophen (PERCOCET/ROXICET) 5-325 MG per tablet Take 1 tablet by mouth every 4 (four) hours as needed for moderate pain or severe pain. Patient not taking: Reported on 05/10/2014 10/21/13   Antony Madura, PA-C  tobramycin (TOBREX) 0.3 % ophthalmic solution Place 1 drop into the right eye every 6 (six) hours. After warm soak to eye. Patient not taking: Reported on 05/10/2014 11/13/13   Linna Hoff, MD   Medications, allergies, past medical history, surgical history, family history, social history and problem list reviewed and updated.  HPI  72 yof with pmh dvt/pe 6 months ago, htn presents with cp, ongoing elevated bp.  Had unprovoked dvt/pe approx 7 months ago. Thinks that it was possibly attributed to a miscarriage she had around that time. Around the time the PE occurred she started having recurrent intermittent sscp episodes. These have persisted. She was treated with Eliquis for 6 months. The cp episodes occur  approx 3-4 days week, lasting mins at a time. Resolve on their own. Located center chest, typically 8/10. Come on at rest. Walks without cp. Denies assoc sob, heart pounding, sweaty palms, panic sensation with the episodes. States she does not have panic attacks or anxiety.   Denies reflux sx.   CP episodes got so bad that she went to ER 2 days ago. Had normal ekg, neg trops, neg chest CT, neg doppler US.   Has hx htn. Cuff at home. Typically runs 160s/90s at home. On amlodipine 5 mg qd for several yrs.   Fam hx early cad with mom having cva and MI in 30s.   Boyfriend in room with her. They are thinking of trying to get preg in few months.   Review of Systems No palps. No abd pain. No fever, chills.     Objective:   Physical Exam  Constitutional: She is oriented to person, place, and time. She appears well-developed and well-nourished.  Non-toxic appearance. She does not have a sickly appearance. She does not appear ill. No distress.  BP 156/94 mmHg  Pulse 72  Temp(Src) 98 F (36.7 C) (Oral)  Resp 18  Ht  (1.727 m)  Wt 278 lb (126.1 kg)  BMI 42.28 kg/m2  SpO2 99%  LMP 04/26/2014   Cardiovascular: Normal rate, regular rhythm and normal heart sounds.  Exam reveals no gallop.   No murmur heard. Pulmonary/Chest: Effort normal and breath sounds normal. She has no decreased  breath sounds. She has no wheezes. She has no rhonchi. She has no rales. She exhibits no tenderness and no bony tenderness.  No ttp over center chest.   Neurological: She is alert and oriented to person, place, and time.      Assessment & Plan:   [age] yof with pmh dvt/pe 6 months ago, htn presents with cp, ongoing elevated bp.  Screening for hyperlipidemia - Plan: Lipid panel Elevated glucose - Plan: POCT glycosylated hemoglobin (Hb A1C) --elevated bg while in hospital  Encounter for preconception consultation - Plan: Ambulatory referral to Obstetrics / Gynecology --planning to get preg soon --referral  for advanced maternal age, hx blood clot 7 months ago, htn  Essential hypertension - Plan: amLODipine (NORVASC) 5 MG tablet --increase to 10 mg amlodipine qd --pt to check daily at home, contact office if continues to run above 140/90 --not adding 2nd agent at this time as pt will likely be following with high risk ob in near future --exercise, limit salt  Chest pain, unspecified chest pain type --unsure of etiology at this time --workup for cardiac, pulm cause neg in ER 2 days ago, chest pain has not changed or worsened since then --no precordial ttp --pt denies panic attack/anxiety type sx --could be secondary to elevated bp, workup as above --could be secondary to possible gerd, try zantac bid  --rtc if not improved few wks  Donnajean Lopesodd M. Pantelis Elgersma, PA-C Physician Assistant-Certified [facility]  05/13/2014 9:24 PM

## 2014-05-13 NOTE — Patient Instructions (Signed)
Please start taking 10 mg amlodipine once a day. Please record your bp readings at home every couple days. Please let us know if you continue to run over 140/90 after a couple weeks on the higher dose. I will be in contact with you about the labs we drew.  Hopefully controlling the bp will help with the chest pain. Please let us know if this doesn't help with the chest pain.  I have referred you to high risk obstetrics to start following with them if you want to get pregnant. It is a good idea to see them prior to getting pregnant as they may want to discuss your high bp and previous clot with you.

## 2014-05-14 ENCOUNTER — Encounter: Payer: Self-pay | Admitting: Physician Assistant

## 2014-05-14 DIAGNOSIS — R7303 Prediabetes: Secondary | ICD-10-CM | POA: Insufficient documentation

## 2014-05-14 DIAGNOSIS — E7800 Pure hypercholesterolemia, unspecified: Secondary | ICD-10-CM | POA: Insufficient documentation

## 2014-05-30 NOTE — Progress Notes (Signed)
Reviewed documentation and agree w/ assessment and plan. Neiva Maenza, MD MPH 

## 2014-10-10 ENCOUNTER — Encounter (HOSPITAL_COMMUNITY): Payer: Self-pay | Admitting: Emergency Medicine

## 2014-10-10 ENCOUNTER — Emergency Department (HOSPITAL_COMMUNITY): Payer: 59

## 2014-10-10 ENCOUNTER — Emergency Department (HOSPITAL_COMMUNITY)
Admission: EM | Admit: 2014-10-10 | Discharge: 2014-10-11 | Disposition: A | Discharge status: Home / Self Care | Payer: 59 | Attending: Emergency Medicine | Admitting: Emergency Medicine

## 2014-10-10 DIAGNOSIS — Z79899 Other long term (current) drug therapy: Secondary | ICD-10-CM | POA: Insufficient documentation

## 2014-10-10 DIAGNOSIS — Z8619 Personal history of other infectious and parasitic diseases: Secondary | ICD-10-CM | POA: Diagnosis not present

## 2014-10-10 DIAGNOSIS — I10 Essential (primary) hypertension: Secondary | ICD-10-CM | POA: Diagnosis not present

## 2014-10-10 DIAGNOSIS — Z880 Allergy status to penicillin: Secondary | ICD-10-CM | POA: Diagnosis not present

## 2014-10-10 DIAGNOSIS — R079 Chest pain, unspecified: Secondary | ICD-10-CM | POA: Insufficient documentation

## 2014-10-10 DIAGNOSIS — Z86718 Personal history of other venous thrombosis and embolism: Secondary | ICD-10-CM | POA: Diagnosis not present

## 2014-10-10 LAB — I-STAT TROPONIN, ED: TROPONIN I, POC: 0 ng/mL (ref 0.00–0.08)

## 2014-10-10 LAB — CBC
HCT: 30.8 % — ABNORMAL LOW (ref 36.0–46.0)
HEMOGLOBIN: 10 g/dL — AB (ref 12.0–15.0)
MCH: 29 pg (ref 26.0–34.0)
MCHC: 32.5 g/dL (ref 30.0–36.0)
MCV: 89.3 fL (ref 78.0–100.0)
Platelets: 307 10*3/uL (ref 150–400)
RBC: 3.45 MIL/uL — ABNORMAL LOW (ref 3.87–5.11)
RDW: 14.8 % (ref 11.5–15.5)
WBC: 8.4 10*3/uL (ref 4.0–10.5)

## 2014-10-10 LAB — BASIC METABOLIC PANEL
Anion gap: 8 (ref 5–15)
BUN: 7 mg/dL (ref 6–20)
CHLORIDE: 106 mmol/L (ref 101–111)
CO2: 21 mmol/L — ABNORMAL LOW (ref 22–32)
CREATININE: 0.5 mg/dL (ref 0.44–1.00)
Calcium: 8.8 mg/dL — ABNORMAL LOW (ref 8.9–10.3)
GFR calc Af Amer: >60 mL/min (ref >60)
GFR calc non Af Amer: >60 mL/min (ref >60)
GLUCOSE: 93 mg/dL (ref 65–99)
POTASSIUM: 3.7 mmol/L (ref 3.5–5.1)
Sodium: 135 mmol/L (ref 135–145)

## 2014-10-10 NOTE — ED Provider Notes (Signed)
CSN: [id number]     Arrival date & time 10/10/14  2222 History   First MD Initiated Contact with Patient 10/10/14 2233     Chief Complaint  Patient presents with  . Chest Pain     (Consider location/radiation/quality/duration/timing/severity/associated sxs/prior Treatment) HPI Mindy Wade is a 34 y.o. female [id number] , [redacted] weeks pregnant, with history of hypertension, PE (August 2015), [redacted] weeks pregnant comes in for evaluation of chest pain. Patient states she woke up at 5:30 PM from a she began to experience a central chest pressure and tightness similar to when she had a PE last August. She reports this discomfort is worse with exertion. She has taken Tylenol with only minimal relief of symptoms. She rates discomfort as a 5/10. She currently takes labetalol, last dose at 10:00 this morning. She denies any headaches, vision changes, nausea or vomiting, abdominal pain, numbness or weakness.  Past Medical History  Diagnosis Date  . Hx of varicella   . Breast feeding status of mother   . Hypertension   . Pregnancy induced hypertension   . Hx of blood clots    Past Surgical History  Procedure Laterality Date  . No past surgeries     Family History  Problem Relation Age of Onset  . Asthma Mother   . Heart disease Mother   . Hypertension Mother   . Stroke Mother   . Cancer Father     prostate  . Alcohol abuse Neg Hx   . Arthritis Neg Hx   . Birth defects Neg Hx   . COPD Neg Hx   . Depression Neg Hx   . Diabetes Neg Hx   . Drug abuse Neg Hx   . Early death Neg Hx   . Hearing loss Neg Hx   . Hyperlipidemia Neg Hx   . Kidney disease Neg Hx   . Learning disabilities Neg Hx   . Mental illness Neg Hx   . Mental retardation Neg Hx   . Miscarriages / Stillbirths Neg Hx   . Vision loss Neg Hx   . Varicose Veins Neg Hx    Social History  Substance Use Topics  . Smoking status: Never Smoker   . Smokeless tobacco: Never Used  . Alcohol Use: Yes     Comment: occasionally    OB History    Gravida Para Term Preterm AB TAB SAB Ectopic Multiple Living   Review of Systems A 10 point review of systems was completed and was negative except for pertinent positives and negatives as mentioned in the history of present illness     Allergies  Penicillins  Home Medications   Prior to Admission medications   Medication Sig Start Date End Date Taking? Authorizing Provider  acetaminophen (TYLENOL) 325 MG tablet Take 650 mg by mouth every 6 (six) hours as needed for moderate pain or headache.   Yes Historical Provider, MD  labetalol (NORMODYNE) 100 MG tablet Take 100 mg by mouth 2 (two) times daily.   Yes Historical Provider, MD  Multiple Vitamin (MULTIVITAMIN WITH MINERALS) TABS tablet Take 1 tablet by mouth daily.   Yes Historical Provider, MD  amLODipine (NORVASC) 5 MG tablet Take 2 tablets (10 mg total) by mouth daily. Patient not taking: Reported on 10/10/2014 05/13/14   Raelyn Ensign, PA  oxyCODONE-acetaminophen (PERCOCET/ROXICET) 5-325 MG per tablet Take 1 tablet by mouth every 4 (four) hours as needed for moderate  pain or severe pain. Patient not taking: Reported on 05/10/2014 10/21/13   Antony Madura, PA-C  tobramycin (TOBREX) 0.3 % ophthalmic solution Place 1 drop into the right eye every 6 (six) hours. After warm soak to eye. Patient not taking: Reported on 05/10/2014 11/13/13   Linna Hoff, MD   BP 146/84 mmHg  Pulse 81  Temp(Src) 98.6 F (37 C) (Oral)  Resp 19  Wt 280 lb (127.007 kg)  SpO2 98%  LMP  Physical Exam  Constitutional: She is oriented to person, place, and time. She appears well-developed and well-nourished. No distress.  HENT:  Head: Normocephalic and atraumatic.  Mouth/Throat: Oropharynx is clear and moist.  Eyes: Conjunctivae are normal. Pupils are equal, round, and reactive to light. Right eye exhibits no discharge. Left eye exhibits no discharge. No scleral icterus.  Neck: Normal range of motion. Neck supple.   Cardiovascular: Normal rate, regular rhythm, normal heart sounds and intact distal pulses.  Exam reveals no gallop and no friction rub.   No murmur heard. Pulmonary/Chest: Effort normal and breath sounds normal. No respiratory distress. She has no wheezes. She has no rales.  Abdominal: Soft. There is no tenderness.  Musculoskeletal: Normal range of motion. She exhibits no edema or tenderness.  Neurological: She is alert and oriented to person, place, and time.  Cranial Nerves II-XII grossly intact  Skin: Skin is warm and dry. No rash noted. She is not diaphoretic.  Psychiatric: She has a normal mood and affect.  Nursing note and vitals reviewed.   ED Course  Procedures (including critical care time) Labs Review Labs Reviewed  BASIC METABOLIC PANEL - Abnormal; Notable for the following:    CO2 21 (*)    Calcium 8.8 (*)    All other components within normal limits  CBC - Abnormal; Notable for the following:    RBC 3.45 (*)    Hemoglobin 10.0 (*)    HCT 30.8 (*)    All other components within normal limits  I-STAT TROPOININ, ED    Imaging Review No results found. I, Sharlene Motts, personally reviewed and evaluated these images and lab results as part of my medical decision-making.   EKG Interpretation   Date/Time:  Sunday October 10 2014 22:26:37 EDT Ventricular Rate:  80 PR Interval:  151 QRS Duration: 88 QT Interval:  374 QTC Calculation: 431 R Axis:   68 Text Interpretation:  Sinus rhythm Baseline wander in lead(s) II III aVF  Confirmed by COOK  MD, BRIAN ([id number]) on 10/10/2014 11:10:13 PM     Filed Vitals:   10/10/14 2226 10/11/14 0003 10/11/14 0125 10/11/14 0151  BP: 144/95  146/84   Pulse: 87 82 81   Temp: 98.6 F (37 C)     TempSrc: Oral     Resp: 20 17 19    Weight:    280 lb (127.007 kg)  SpO2: 99% 98% 98%    Meds given in ED:  Medications  enoxaparin (LOVENOX) injection 130 mg (not administered)  HYDROcodone-acetaminophen (NORCO/VICODIN) 5-325  MG per tablet 2 tablet (not administered)    New Prescriptions   No medications on file    MDM  Patient presents with chest discomfort similar to previous when diagnosed with PE in August 2015. Patient with stable vital signs, no tachycardia, tachypnea or hypoxia. Resting comfortably in the ED. Reports she is going to pick up her Lovenox shots tomorrow per direction of her OB. Hemoglobin appears baseline for patient, labs otherwise noncontributory. Troponin negative. EKG is reassuring  Consult to [facility], spoke with Dr. Jackelyn Knife and he agrees it is reasonable to initiate Lovenox treatment in the ED and patient may start her outpatient therapy tomorrow as originally planned. Also discussed with my attending, Dr. Adriana Simas who agrees with plan. No evidence of other acute or emergent pathology at this time.   Final diagnoses:  Chest pain, unspecified chest pain type        Joycie Peek, PA-C 10/11/14 0153  Donnetta Hutching, MD 10/13/14 [phone]

## 2014-10-10 NOTE — ED Notes (Signed)
Pt from home c/o central chest pressure and shortness of breath upon exertion. Pt is [redacted] weeks pregnant. Hx of P.E. In august.

## 2014-10-11 DIAGNOSIS — R079 Chest pain, unspecified: Secondary | ICD-10-CM | POA: Diagnosis not present

## 2014-10-11 MED ORDER — HYDROCODONE-ACETAMINOPHEN 5-325 MG PO TABS
2.0000 | ORAL_TABLET | Freq: Once | ORAL | Status: AC
  Administered 2014-10-11: 2 via ORAL
  Filled 2014-10-11: qty 2

## 2014-10-11 MED ORDER — ENOXAPARIN SODIUM 150 MG/ML ~~LOC~~ SOLN
1.0000 mg/kg | Freq: Once | SUBCUTANEOUS | Status: AC
  Administered 2014-10-11: 130 mg via SUBCUTANEOUS
  Filled 2014-10-11: qty 1

## 2014-10-11 NOTE — Discharge Instructions (Signed)
You were evaluated in the ED today for your chest discomfort. You'll be given 1 dose of Lovenox in the ED. It is important for you to pick up your prescription for Lovenox tomorrow and continue taking as scheduled by your OB. There does not appear to be any other emergent cause for your chest discomfort at this time. Return to ED for worsening symptoms.  Chest Pain (Nonspecific) It is often hard to give a specific diagnosis for the cause of chest pain. There is always a chance that your pain could be related to something serious, such as a heart attack or a blood clot in the lungs. You need to follow up with your health care provider for further evaluation. CAUSES   Heartburn.  Pneumonia or bronchitis.  Anxiety or stress.  Inflammation around your heart (pericarditis) or lung (pleuritis or pleurisy).  A blood clot in the lung.  A collapsed lung (pneumothorax). It can develop suddenly on its own (spontaneous pneumothorax) or from trauma to the chest.  Shingles infection (herpes zoster virus). The chest wall is composed of bones, muscles, and cartilage. Any of these can be the source of the pain.  The bones can be bruised by injury.  The muscles or cartilage can be strained by coughing or overwork.  The cartilage can be affected by inflammation and become sore (costochondritis). DIAGNOSIS  Lab tests or other studies may be needed to find the cause of your pain. Your health care provider may have you take a test called an ambulatory electrocardiogram (ECG). An ECG records your heartbeat patterns over a 24-hour period. You may also have other tests, such as:  Transthoracic echocardiogram (TTE). During echocardiography, sound waves are used to evaluate how blood flows through your heart.  Transesophageal echocardiogram (TEE).  Cardiac monitoring. This allows your health care provider to monitor your heart rate and rhythm in real time.  Holter monitor. This is a portable device that  records your heartbeat and can help diagnose heart arrhythmias. It allows your health care provider to track your heart activity for several days, if needed.  Stress tests by exercise or by giving medicine that makes the heart beat faster. TREATMENT   Treatment depends on what may be causing your chest pain. Treatment may include:  Acid blockers for heartburn.  Anti-inflammatory medicine.  Pain medicine for inflammatory conditions.  Antibiotics if an infection is present.  You may be advised to change lifestyle habits. This includes stopping smoking and avoiding alcohol, caffeine, and chocolate.  You may be advised to keep your head raised (elevated) when sleeping. This reduces the chance of acid going backward from your stomach into your esophagus. Most of the time, nonspecific chest pain will improve within 2-3 days with rest and mild pain medicine.  HOME CARE INSTRUCTIONS   If antibiotics were prescribed, take them as directed. Finish them even if you start to feel better.  For the next few days, avoid physical activities that bring on chest pain. Continue physical activities as directed.  Do not use any tobacco products, including cigarettes, chewing tobacco, or electronic cigarettes.  Avoid drinking alcohol.  Only take medicine as directed by your health care provider.  Follow your health care provider's suggestions for further testing if your chest pain does not go away.  Keep any follow-up appointments you made. If you do not go to an appointment, you could develop lasting (chronic) problems with pain. If there is any problem keeping an appointment, call to reschedule. SEEK MEDICAL CARE IF:  Your chest pain does not go away, even after treatment.  You have a rash with blisters on your chest.  You have a fever. SEEK IMMEDIATE MEDICAL CARE IF:   You have increased chest pain or pain that spreads to your arm, neck, jaw, back, or abdomen.  You have shortness of  breath.  You have an increasing cough, or you cough up blood.  You have severe back or abdominal pain.  You feel nauseous or vomit.  You have severe weakness.  You faint.  You have chills. This is an emergency. Do not wait to see if the pain will go away. Get medical help at once. Call your local emergency services (911 in [place] Do not drive yourself to the hospital. MAKE SURE YOU:   Understand these instructions.  Will watch your condition.  Will get help right away if you are not doing well or get worse. Document Released: 11/22/2004 Document Revised: 02/17/2013 Document Reviewed: 09/18/2007 [facility] Patient Information 2015 [place], [place]. This information is not intended to replace advice given to you by your health care provider. Make sure you discuss any questions you have with your health care provider.

## 2014-10-12 LAB — OB RESULTS CONSOLE ABO/RH: RH TYPE: POSITIVE

## 2014-10-12 LAB — OB RESULTS CONSOLE HEPATITIS B SURFACE ANTIGEN: HEP B S AG: NEGATIVE

## 2014-10-12 LAB — OB RESULTS CONSOLE ANTIBODY SCREEN: Antibody Screen: POSITIVE

## 2014-10-12 LAB — OB RESULTS CONSOLE RPR: RPR: NONREACTIVE

## 2014-10-12 LAB — OB RESULTS CONSOLE GC/CHLAMYDIA
Chlamydia: NEGATIVE
Gonorrhea: NEGATIVE

## 2014-10-12 LAB — OB RESULTS CONSOLE HIV ANTIBODY (ROUTINE TESTING): HIV: NONREACTIVE

## 2014-10-12 LAB — OB RESULTS CONSOLE RUBELLA ANTIBODY, IGM: Rubella: IMMUNE

## 2014-10-25 ENCOUNTER — Telehealth: Payer: Self-pay | Admitting: Hematology

## 2014-10-25 NOTE — Telephone Encounter (Signed)
Called pt to schedule new patient appt. Left message.  Dx: Personal history of pulmonary embolism Referring: Dr. Earlie Counts

## 2014-11-03 ENCOUNTER — Ambulatory Visit (HOSPITAL_BASED_OUTPATIENT_CLINIC_OR_DEPARTMENT_OTHER): Payer: 59 | Admitting: Hematology

## 2014-11-03 ENCOUNTER — Telehealth: Payer: Self-pay | Admitting: Hematology

## 2014-11-03 ENCOUNTER — Ambulatory Visit (HOSPITAL_BASED_OUTPATIENT_CLINIC_OR_DEPARTMENT_OTHER): Payer: 59

## 2014-11-03 ENCOUNTER — Encounter: Payer: Self-pay | Admitting: Hematology

## 2014-11-03 ENCOUNTER — Other Ambulatory Visit: Payer: 59

## 2014-11-03 VITALS — BP 140/80 | HR 72 | Temp 98.7°F | Resp 16 | Ht 68.0 in | Wt 280.1 lb

## 2014-11-03 DIAGNOSIS — I2699 Other pulmonary embolism without acute cor pulmonale: Secondary | ICD-10-CM | POA: Diagnosis not present

## 2014-11-03 DIAGNOSIS — Z86711 Personal history of pulmonary embolism: Secondary | ICD-10-CM | POA: Diagnosis not present

## 2014-11-03 DIAGNOSIS — I10 Essential (primary) hypertension: Secondary | ICD-10-CM

## 2014-11-03 LAB — FERRITIN CHCC: Ferritin: 26 ng/ml (ref 9–269)

## 2014-11-03 NOTE — Progress Notes (Signed)
Marland Kitchen    HEMATOLOGY/ONCOLOGY CONSULTATION NOTE  Date of Service: 11/03/2014  Patient Care Team: Provider Not In System as PCP - General  CHIEF COMPLAINTS/PURPOSE OF CONSULTATION:  Evaluation for hypercoagulable state  HISTORY OF PRESENTING ILLNESS:  Mindy Wade is a wonderful 34 y.o. female who has been referred to [place] by Dr Sherron Monday for recommendations regarding the patient's anticoagulation given her history of previous pulmonary embolism and the fact that she is currently pregnant with her sixth child.  Patient has a history of hypertension,morbid obesity, previous pregnancy-induced hypertension and a history of pulmonary embolism in August 2015 1 week after having a miscarriage at 21 weeks with her fifth pregnancy.patient notes that she had presented with chest pain and shortness of breath and was noted to have a pulmonary embolism.  She reports ultrasound of the legs were negative for DVT.  She was treated with Eliquis for 6 months from August 2015 through March 2016 for presumed provoked pulmonary embolism.  She has no family history of venous thromboembolism.  Had 4 pregnancies before that without any concerns with venous thromboembolism.  She also notes that she has also previously been on oral contraceptive pills for as long as 3-5 years as a potential provoking event but never had venous thromboembolism.  Patient is currently about [redacted] weeks pregnant with her sixth child and was started on prophylactic Lovenox 40 mg subcutaneous daily about 2 months ago.  She complained of some intermittent shortness of breath and chest pain when she saw her GYN doctor on 10/12/2014.  Her symptoms have since resolved. She has been a lifelong nonsmoker   She notes that she was having chronic headaches which have worsened since her labetalol dose was increased for better control of her hypertension.  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  She works with [employer] in the [employer]. Reports normal acute new leg swelling chest pain or shortness of breath at this time.   MEDICAL HISTORY:  Past Medical History  Diagnosis Date  . Hx of varicella   . Breast feeding status of mother   . Hypertension   . Pregnancy induced hypertension   . Hx of blood clots   Obesity .Body mass index is 42.6 kg/(m^2). 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  SURGICAL HISTORY: Past Surgical History  Procedure Laterality Date  . No past surgeries      SOCIAL HISTORY: Social History   Social History  . Marital Status: Legally Separated    Spouse Name: N/A  . Number of Children: N/A  . Years of Education: N/A   Occupational History  . Not on file.   Social History Main Topics  . Smoking status: Never Smoker   . Smokeless tobacco: Never Used  . Alcohol Use: Yes     Comment: occasionally  . Drug Use: No  . Sexual Activity: Yes    Birth Control/ Protection: None   Other Topics Concern  . Not on file   Social History Narrative    FAMILY HISTORY: Family History  Problem Relation Age of Onset  . Asthma Mother   . Heart disease Mother   . Hypertension Mother   . Stroke Mother   . Cancer Father     prostate  . Alcohol abuse Neg Hx   . Arthritis Neg Hx   . Birth  defects Neg Hx   . COPD Neg Hx   . Depression Neg Hx   . Diabetes Neg Hx   . Drug abuse Neg Hx   . Early death Neg Hx   . Hearing loss Neg Hx   . Hyperlipidemia Neg Hx   . Kidney disease Neg Hx   . Learning disabilities Neg Hx   . Mental illness Neg Hx   . Mental retardation Neg Hx   . Miscarriages / Stillbirths Neg Hx   . Vision loss Neg Hx   . Varicose Veins Neg Hx     ALLERGIES:  is allergic to penicillins.  MEDICATIONS:  Current Outpatient Prescriptions  Medication Sig Dispense Refill  . acetaminophen (TYLENOL) 325 MG tablet  Take 650 mg by mouth every 6 (six) hours as needed for moderate pain or headache.    Marland Kitchen LOVENOX 40 MG/0.4ML injection INJECT 0.4 ML BY SUBCUTANEOUS ROUTE D FOR 30 DAYS  0  . Multiple Vitamin (MULTIVITAMIN WITH MINERALS) TABS tablet Take 1 tablet by mouth daily.    . Vitamins/Minerals TABS Take by mouth.    . labetalol (NORMODYNE) 200 MG tablet TK 1 T PO  BID  2   No current facility-administered medications for this visit.    REVIEW OF SYSTEMS:    10 Point review of Systems was done is negative except as noted above.  PHYSICAL EXAMINATION: ECOG PERFORMANCE STATUS: 0 - Asymptomatic  . Filed Vitals:   11/03/14 1138  Height: 5\' 8"  (1.727 m)  Weight: 280 lb 1.6 oz (127.053 kg)   Filed Weights   11/03/14 1138  Weight: 280 lb 1.6 oz (127.053 kg)   .Body mass index is 42.6 kg/(m^2).  GENERAL:alert, in no acute distress and comfortable SKIN: skin color, texture, turgor are normal, no rashes or significant lesions EYES: normal, conjunctiva are pink and non-injected, sclera clear OROPHARYNX:no exudate, no erythema and lips, buccal mucosa, and tongue normal  NECK: supple, no JVD, thyroid normal size, non-tender, without nodularity LYMPH:  no palpable lymphadenopathy in the cervical, axillary or inguinal LUNGS: clear to auscultation with normal respiratory effort HEART: regular rate & rhythm,  no murmurs and no lower extremity edema ABDOMEN: abdomen soft, non-tender, normoactive bowel sounds , gravid uterus Musculoskeletal: no cyanosis of digits and no clubbing  PSYCH: alert & oriented x 3 with fluent speech NEURO: no focal motor/sensory deficits  LABORATORY DATA:  I have reviewed the data as listed  . CBC Latest Ref Rng 10/10/2014 05/10/2014 10/21/2013  WBC 4.0 - 10.5 K/uL 8.4 7.8 7.1  Hemoglobin 12.0 - 15.0 g/dL 10.0(L) 12.1 13.1  Hematocrit 36.0 - 46.0 % 30.8(L) 37.6 38.1  Platelets 150 - 400 K/uL 307 319 351    . CMP Latest Ref Rng 10/10/2014 05/10/2014 10/21/2013  Glucose 65 -  99 mg/dL 93 161(W) 96(E)  BUN 6 - 20 mg/dL 7 12 9   Creatinine 0.44 - 1.00 mg/dL 4.54 0.98 1.19  Sodium 135 - 145 mmol/L 135 138 141  Potassium 3.5 - 5.1 mmol/L 3.7 3.3(L) 3.6(L)  Chloride 101 - 111 mmol/L 106 106 102  CO2 22 - 32 mmol/L 21(L) 23 24  Calcium 8.9 - 10.3 mg/dL 1.4(N) 8.8 9.3  Total Protein 6.0 - 8.3 g/dL - - -  Total Bilirubin 0.3 - 1.2 mg/dL - - -  Alkaline Phos 39 - 117 U/L - - -  AST 0 - 37 U/L - - -  ALT 0 - 35 U/L - - -      Protein  C and protein S likely low related to physiologic changes of pregnancy. Previous normal values argue against hereditary deficiency  Factor V Leiden mutation negative  Prothrombin gene mutation negative  RADIOGRAPHIC STUDIES: I have personally reviewed the radiological images as listed and agreed with the findings in the report. No results found.  ASSESSMENT & PLAN:   34 year old female who is currently about [redacted] weeks pregnant with her sixth pregnancy with history of  #1 provoked PE in August 2015 one week after having a miscarriage at [place]. Patient completed 6 months of uneventful anticoagulation for this. She is currently [redacted] weeks pregnant with her sixth pregnancy and started on prophylactic Lovenox 40 mg subcutaneous daily about 2 months ago. Had some shortness of breath and chest discomfort a couple of weeks ago but this has now resolved.  #2 morbid obesity .Body mass index is 42.6 kg/(m^2).  #3 history of hypertension now with pregnancy worsened hypertension as well.  She is on labetalol  Dose recently increased a couple weeks ago and appears to be causing some headaches.  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  Increased risk of HDN  PLAN -Hypercoagulable workup unrevealing. -given the patient's morbid obesity with a BMI of >40 it might be reasonable to use a prophylactic dose of Lovenox about 30% higher that is 60 mg subcutaneously daily instead of her current dose of 40 mg  subcutaneous daily provided that is no significant increased risk of bleeding from an obgyn standpoint. -would continue prophylactic anticoagulation throughout pregnancy and for at least 6 weeks postpartum. -continue followup with OB/GYN for monitoring of antibody titers and appropriate interventions for mx of possible HDN. -patient counseled to stay well hydrated.  RTC with Dr Candise Che as needed if any other questions or concerns arise. All of the patients questions were answered with apparent satisfaction. The patient knows to call the clinic with any problems, questions or concerns.  I spent 45 minutes counseling the patient face to face. The total time spent in the appointment was 60 minutes and more than 50% was on counseling and direct patient cares.    Wyvonnia Lora MD MS AAHIVMS [facility] Hematology/Oncology Physician [facility]  (Office):       [phone] (Work cell):  [phone] (Fax):           [phone]  11/03/2014 12:04 PM

## 2014-11-03 NOTE — Telephone Encounter (Signed)
Appointments made and avs printed for patient °

## 2014-11-08 ENCOUNTER — Encounter: Payer: Self-pay | Admitting: Hematology

## 2014-11-08 LAB — RFX PTT-LA W/RFX TO HEX PHASE CONF: PTT-LA Screen: 35 s (ref <=40)

## 2014-11-08 LAB — HYPERCOAGULABLE PANEL, COMPREHENSIVE
ANTICARDIOLIPIN IGG: 8 GPL U/mL (ref <23)
ANTICARDIOLIPIN IGM: 0 [MPL'U]/mL (ref <11)
AntiThromb III Func: 90 % activity (ref 80–120)
Anticardiolipin IgA: 6 APL U/mL (ref <22)
Beta-2 Glyco I IgG: 2 G Units (ref <20)
Beta-2-Glycoprotein I IgA: 6 A Units (ref <20)
Beta-2-Glycoprotein I IgM: 3 M Units (ref <20)
PROTEIN C ACTIVITY: 65 % — AB (ref 70–180)
PROTEIN S ANTIGEN, TOTAL: 92 % (ref 70–140)
Protein C Antigen: 121 % (ref 70–140)
Protein S Activity: 50 % — ABNORMAL LOW (ref 60–140)

## 2014-11-08 LAB — RFX DRVVT SCR W/RFLX CONF 1:1 MIX: DRVVT SCREEN: 30 s (ref <=45)

## 2014-11-08 LAB — VITAMIN B12: VITAMIN B 12: 347 pg/mL (ref 211–911)

## 2014-11-08 NOTE — Progress Notes (Signed)
Called pt and left message to introduce myself with call back number.

## 2014-11-09 ENCOUNTER — Encounter: Payer: Self-pay | Admitting: Hematology

## 2014-11-09 NOTE — Progress Notes (Signed)
Pt left voicemail on yesterday evening after receiving my message. Attempted to reach patient again to introduce myself,left message.

## 2015-02-01 LAB — OB RESULTS CONSOLE GBS: GBS: POSITIVE

## 2015-02-02 ENCOUNTER — Encounter (HOSPITAL_COMMUNITY): Payer: Self-pay | Admitting: Medical

## 2015-02-02 ENCOUNTER — Inpatient Hospital Stay (HOSPITAL_COMMUNITY)
Admission: AD | Admit: 2015-02-02 | Discharge: 2015-02-02 | Disposition: A | Discharge status: Home / Self Care | Payer: 59 | Source: Ambulatory Visit | Attending: Obstetrics and Gynecology | Admitting: Obstetrics and Gynecology

## 2015-02-02 DIAGNOSIS — O4693 Antepartum hemorrhage, unspecified, third trimester: Secondary | ICD-10-CM | POA: Diagnosis not present

## 2015-02-02 DIAGNOSIS — Z86711 Personal history of pulmonary embolism: Secondary | ICD-10-CM | POA: Insufficient documentation

## 2015-02-02 DIAGNOSIS — Z880 Allergy status to penicillin: Secondary | ICD-10-CM | POA: Insufficient documentation

## 2015-02-02 DIAGNOSIS — Z6791 Unspecified blood type, Rh negative: Secondary | ICD-10-CM | POA: Insufficient documentation

## 2015-02-02 DIAGNOSIS — Z3A35 35 weeks gestation of pregnancy: Secondary | ICD-10-CM | POA: Diagnosis not present

## 2015-02-02 DIAGNOSIS — O26893 Other specified pregnancy related conditions, third trimester: Secondary | ICD-10-CM | POA: Diagnosis not present

## 2015-02-02 DIAGNOSIS — O10013 Pre-existing essential hypertension complicating pregnancy, third trimester: Secondary | ICD-10-CM | POA: Diagnosis not present

## 2015-02-02 LAB — AMNISURE RUPTURE OF MEMBRANE (ROM) NOT AT ARMC: AMNISURE: NEGATIVE

## 2015-02-02 NOTE — Discharge Instructions (Signed)
Pelvic Rest Pelvic rest is sometimes recommended for women when:   The placenta is partially or completely covering the opening of the cervix (placenta previa).  There is bleeding between the uterine wall and the amniotic sac in the first trimester (subchorionic hemorrhage).  The cervix begins to open without labor starting (incompetent cervix, cervical insufficiency).  The labor is too early (preterm labor). HOME CARE INSTRUCTIONS  Do not have sexual intercourse, stimulation, or an orgasm.  Do not use tampons, douche, or put anything in the vagina.  Do not lift anything over 10 pounds (4.5 kg).  Avoid strenuous activity or straining your pelvic muscles. SEEK MEDICAL CARE IF:  You have any vaginal bleeding during pregnancy. Treat this as a potential emergency.  You have cramping pain felt low in the stomach (stronger than menstrual cramps).  You notice vaginal discharge (watery, mucus, or bloody).  You have a low, dull backache.  There are regular contractions or uterine tightening. SEEK IMMEDIATE MEDICAL CARE IF: You have vaginal bleeding and have placenta previa.    This information is not intended to replace advice given to you by your health care provider. Make sure you discuss any questions you have with your health care provider.   Document Released: 06/09/2010 Document Revised: 05/07/2011 Document Reviewed: 08/16/2014 Elsevier Interactive Patient Education 2016 Elsevier Inc. Ball CorporationBraxton Hicks Contractions Contractions of the uterus can occur throughout pregnancy. Contractions are not always a sign that you are in labor.  WHAT ARE BRAXTON HICKS CONTRACTIONS?  Contractions that occur before labor are called Braxton Hicks contractions, or false labor. Toward the end of pregnancy (32-34 weeks), these contractions can develop more often and may become more forceful. This is not true labor because these contractions do not result in opening (dilatation) and thinning of the  cervix. They are sometimes difficult to tell apart from true labor because these contractions can be forceful and people have different pain tolerances. You should not feel embarrassed if you go to the hospital with false labor. Sometimes, the only way to tell if you are in true labor is for your health care provider to look for changes in the cervix. If there are no prenatal problems or other health problems associated with the pregnancy, it is completely safe to be sent home with false labor and await the onset of true labor. HOW CAN YOU TELL THE DIFFERENCE BETWEEN TRUE AND FALSE LABOR? False Labor  The contractions of false labor are usually shorter and not as hard as those of true labor.   The contractions are usually irregular.   The contractions are often felt in the front of the lower abdomen and in the groin.   The contractions may go away when you walk around or change positions while lying down.   The contractions get weaker and are shorter lasting as time goes on.   The contractions do not usually become progressively stronger, regular, and closer together as with true labor.  True Labor  Contractions in true labor last 30-70 seconds, become very regular, usually become more intense, and increase in frequency.   The contractions do not go away with walking.   The discomfort is usually felt in the top of the uterus and spreads to the lower abdomen and low back.   True labor can be determined by your health care provider with an exam. This will show that the cervix is dilating and getting thinner.  WHAT TO REMEMBER  Keep up with your usual exercises and follow other  instructions given by your health care provider.   Take medicines as directed by your health care provider.   Keep your regular prenatal appointments.   Eat and drink lightly if you think you are going into labor.   If Braxton Hicks contractions are making you uncomfortable:   Change your  position from lying down or resting to walking, or from walking to resting.   Sit and rest in a tub of warm water.   Drink 2-3 glasses of water. Dehydration may cause these contractions.   Do slow and deep breathing several times an hour.  WHEN SHOULD I SEEK IMMEDIATE MEDICAL CARE? Seek immediate medical care if:  Your contractions become stronger, more regular, and closer together.   You have fluid leaking or gushing from your vagina.   You have a fever.   You pass blood-tinged mucus.   You have vaginal bleeding.   You have continuous abdominal pain.   You have low back pain that you never had before.   You feel your baby's head pushing down and causing pelvic pressure.   Your baby is not moving as much as it used to.    This information is not intended to replace advice given to you by your health care provider. Make sure you discuss any questions you have with your health care provider.   Document Released: 02/12/2005 Document Revised: 02/17/2013 Document Reviewed: 11/24/2012 Elsevier Interactive Patient Education [employer]

## 2015-02-02 NOTE — MAU Note (Signed)
Pt reports cramping and bleeding this am.

## 2015-02-02 NOTE — MAU Note (Signed)
Pt c/o cramping off and on during night. Noticed brownish blood in toilet and when wiping around 245am. +FM. Denies LOF. Had sve in office yesterday-2 cm

## 2015-02-02 NOTE — MAU Provider Note (Signed)
History     CSN: [id number]  Arrival date and time: 02/02/15 [id number]   First Provider Initiated Contact with Patient 02/02/15 0405      No chief complaint on file.  HPI  Ms. Mindy DearsValiencia J [place]une[place] Wade is a 34 y.o. [phone] at 447w6d who presents to MAU today with complaint of vaginal bleeding and lower abdominal cramping since earlier this morning. The patient states that she noted blood in the toilet and with wiping today. She states contractions last night q 4 minutes x 30 minutes, but none now. She denies LOF, N/V/D, UTI symptoms or fever. She denies complications with this pregnancy, but does have CHTN and h/o PE following last pregnancy. She is on Labetalol and Lovenox. She also has a history of a 22 week delivery with her last pregnancy and demise of that child soon after birth. She reports good fetal movement today.   OB History    Gravida Para Term Preterm AB TAB SAB Ectopic Multiple Living   7 6 5 1      5       Past Medical History  Diagnosis Date  . Hx of varicella   . Breast feeding status of mother   . Hypertension   . Pregnancy induced hypertension   . Hx of blood clots     Past Surgical History  Procedure Laterality Date  . No past surgeries      Family History  Problem Relation Age of Onset  . Asthma Mother   . Heart disease Mother   . Hypertension Mother   . Stroke Mother   . Cancer Father     prostate  . Alcohol abuse Neg Hx   . Arthritis Neg Hx   . Birth defects Neg Hx   . COPD Neg Hx   . Depression Neg Hx   . Diabetes Neg Hx   . Drug abuse Neg Hx   . Early death Neg Hx   . Hearing loss Neg Hx   . Hyperlipidemia Neg Hx   . Kidney disease Neg Hx   . Learning disabilities Neg Hx   . Mental illness Neg Hx   . Mental retardation Neg Hx   . Miscarriages / Stillbirths Neg Hx   . Vision loss Neg Hx   . Varicose Veins Neg Hx     Social History  Substance Use Topics  . Smoking status: Never Smoker   . Smokeless tobacco: Never Used  . Alcohol Use: Yes      Comment: occasionally    Allergies:  Allergies  Allergen Reactions  . Penicillins Anaphylaxis    Prescriptions prior to admission  Medication Sig Dispense Refill Last Dose  . acetaminophen (TYLENOL) 325 MG tablet Take 650 mg by mouth every 6 (six) hours as needed for moderate pain or headache.   02/01/2015 at Unknown time  . labetalol (NORMODYNE) 200 MG tablet TK 1 T PO  BID  2 02/01/2015 at Unknown time  . LOVENOX 40 MG/0.4ML injection INJECT 0.4 ML BY SUBCUTANEOUS ROUTE D FOR 30 DAYS  0 02/01/2015 at Unknown time  . Multiple Vitamin (MULTIVITAMIN WITH MINERALS) TABS tablet Take 1 tablet by mouth daily.   02/01/2015 at Unknown time  . Vitamins/Minerals TABS Take by mouth.   02/01/2015 at Unknown time    Review of Systems  Constitutional: Negative for fever and malaise/fatigue.  Gastrointestinal: Positive for abdominal pain. Negative for nausea, vomiting, diarrhea and constipation.  Genitourinary: Negative for dysuria, urgency and frequency.       +  vaginal bleeding   Physical Exam   Blood pressure 136/87, pulse 99, temperature 98.8 F (37.1 C), temperature source Oral, resp. rate 18, height  (1.727 m), weight 280 lb (127.007 kg), last menstrual period 04/26/2014, SpO2 100 %, unknown if currently breastfeeding.  Physical Exam  Nursing note and vitals reviewed. Constitutional: She is oriented to person, place, and time. She appears well-developed and well-nourished. No distress.  HENT:  Head: Normocephalic and atraumatic.  Cardiovascular: Normal rate.   Respiratory: Effort normal.  GI: Soft. She exhibits no distension and no mass. There is no tenderness. There is no rebound and no guarding.  Genitourinary: There is bleeding (scant) in the vagina. Vaginal discharge ( pooling of clear/brown fluid) found.  Neurological: She is alert and oriented to person, place, and time.  Skin: Skin is warm and dry. No erythema.  Psychiatric: She has a normal mood and affect.  Dilation:  2 Effacement (%): 30 Cervical Position: Posterior Exam by:: Harlon Flor PA-C  Results for orders placed or performed during the hospital encounter of 02/02/15 (from the past 24 hour(s))  Amnisure rupture of membrane (rom)not at [facility]     Status: None   Collection Time: 02/02/15  3:54 AM  Result Value Ref Range   Amnisure ROM NEGATIVE     Fetal Monitoring: Baseline: 140 bpm, moderate variability, + accelerations, no decelerations Contractions: none  MAU Course  Procedures None  MDM Crist Fat - negative Exam concerning for SROM - Amnisure ordered Discussed patient with Dr. Senaida Ores. Ok for discharge at this time.   Assessment and Plan  A: SIUP at [redacted]w[redacted]d Vaginal bleeding in pregnancy, third trimester  P: Discharge home Tylenol PRN for pain Preterm labor precautions discussed Patient advised to follow-up with [facility] as scheduled for routine prenatal care or sooner PRN Patient may return to MAU as needed or if her condition were to change or worsen   Marny Lowenstein, PA-C  02/02/2015, 4:22 AM

## 2015-02-18 ENCOUNTER — Encounter (HOSPITAL_COMMUNITY): Payer: Self-pay

## 2015-02-18 ENCOUNTER — Inpatient Hospital Stay (HOSPITAL_COMMUNITY)
Admission: AD | Admit: 2015-02-18 | Discharge: 2015-02-18 | Disposition: A | Discharge status: Home / Self Care | Payer: 59 | Source: Ambulatory Visit | Attending: Obstetrics and Gynecology | Admitting: Obstetrics and Gynecology

## 2015-02-18 ENCOUNTER — Inpatient Hospital Stay (HOSPITAL_COMMUNITY): Payer: 59 | Admitting: Anesthesiology

## 2015-02-18 ENCOUNTER — Inpatient Hospital Stay (HOSPITAL_COMMUNITY)
Admission: AD | Admit: 2015-02-18 | Discharge: 2015-02-20 | DRG: 774 | Disposition: A | Discharge status: Home / Self Care | Payer: 59 | Source: Ambulatory Visit | Attending: Obstetrics and Gynecology | Admitting: Obstetrics and Gynecology

## 2015-02-18 DIAGNOSIS — Z8249 Family history of ischemic heart disease and other diseases of the circulatory system: Secondary | ICD-10-CM

## 2015-02-18 DIAGNOSIS — Z3A38 38 weeks gestation of pregnancy: Secondary | ICD-10-CM | POA: Diagnosis not present

## 2015-02-18 DIAGNOSIS — O1092 Unspecified pre-existing hypertension complicating childbirth: Secondary | ICD-10-CM | POA: Diagnosis present

## 2015-02-18 DIAGNOSIS — Z880 Allergy status to penicillin: Secondary | ICD-10-CM | POA: Diagnosis not present

## 2015-02-18 DIAGNOSIS — Z86711 Personal history of pulmonary embolism: Secondary | ICD-10-CM | POA: Diagnosis not present

## 2015-02-18 DIAGNOSIS — Z823 Family history of stroke: Secondary | ICD-10-CM | POA: Diagnosis not present

## 2015-02-18 DIAGNOSIS — O99824 Streptococcus B carrier state complicating childbirth: Secondary | ICD-10-CM | POA: Diagnosis present

## 2015-02-18 DIAGNOSIS — O99214 Obesity complicating childbirth: Secondary | ICD-10-CM | POA: Diagnosis present

## 2015-02-18 DIAGNOSIS — IMO0001 Reserved for inherently not codable concepts without codable children: Secondary | ICD-10-CM

## 2015-02-18 DIAGNOSIS — O09523 Supervision of elderly multigravida, third trimester: Secondary | ICD-10-CM | POA: Diagnosis not present

## 2015-02-18 DIAGNOSIS — Z6841 Body Mass Index (BMI) 40.0 and over, adult: Secondary | ICD-10-CM

## 2015-02-18 LAB — CBC
HEMATOCRIT: 31.9 % — AB (ref 36.0–46.0)
HEMOGLOBIN: 10.4 g/dL — AB (ref 12.0–15.0)
MCH: 27.4 pg (ref 26.0–34.0)
MCHC: 32.6 g/dL (ref 30.0–36.0)
MCV: 83.9 fL (ref 78.0–100.0)
Platelets: 204 10*3/uL (ref 150–400)
RBC: 3.8 MIL/uL — ABNORMAL LOW (ref 3.87–5.11)
RDW: 16.7 % — AB (ref 11.5–15.5)
WBC: 7.5 10*3/uL (ref 4.0–10.5)

## 2015-02-18 MED ORDER — FENTANYL 2.5 MCG/ML BUPIVACAINE 1/10 % EPIDURAL INFUSION (WH - ANES)
14.0000 mL/h | INTRAMUSCULAR | Status: DC | PRN
  Administered 2015-02-18 (×2): 14 mL/h via EPIDURAL
  Filled 2015-02-18: qty 125

## 2015-02-18 MED ORDER — OXYTOCIN 40 UNITS IN LACTATED RINGERS INFUSION - SIMPLE MED
62.5000 mL/h | INTRAVENOUS | Status: DC
  Administered 2015-02-19: 62.5 mL/h via INTRAVENOUS
  Filled 2015-02-18: qty 1000

## 2015-02-18 MED ORDER — PROMETHAZINE HCL 25 MG PO TABS: 25.0000 mg | ORAL_TABLET | Freq: Once | ORAL | Status: DC

## 2015-02-18 MED ORDER — OXYCODONE-ACETAMINOPHEN 5-325 MG PO TABS: 2.0000 | ORAL_TABLET | ORAL | Status: DC | PRN

## 2015-02-18 MED ORDER — ONDANSETRON HCL 4 MG/2ML IJ SOLN: 4.0000 mg | Freq: Four times a day (QID) | INTRAMUSCULAR | Status: DC | PRN

## 2015-02-18 MED ORDER — LIDOCAINE HCL (PF) 1 % IJ SOLN
30.0000 mL | INTRAMUSCULAR | Status: DC | PRN
  Filled 2015-02-18: qty 30

## 2015-02-18 MED ORDER — LACTATED RINGERS IV SOLN
INTRAVENOUS | Status: DC
  Administered 2015-02-18: 22:00:00 via INTRAVENOUS

## 2015-02-18 MED ORDER — LACTATED RINGERS IV SOLN: 500.0000 mL | INTRAVENOUS | Status: DC | PRN

## 2015-02-18 MED ORDER — LIDOCAINE HCL (PF) 1 % IJ SOLN
INTRAMUSCULAR | Status: DC | PRN
  Administered 2015-02-18 (×2): 8 mL via EPIDURAL

## 2015-02-18 MED ORDER — OXYTOCIN BOLUS FROM INFUSION
500.0000 mL | INTRAVENOUS | Status: DC
  Administered 2015-02-19: 500 mL via INTRAVENOUS

## 2015-02-18 MED ORDER — BUTORPHANOL TARTRATE 2 MG/ML IJ SOLN: 2.0000 mg | Freq: Once | INTRAMUSCULAR | Status: DC

## 2015-02-18 MED ORDER — ACETAMINOPHEN 325 MG PO TABS: 650.0000 mg | ORAL_TABLET | ORAL | Status: DC | PRN

## 2015-02-18 MED ORDER — PHENYLEPHRINE 40 MCG/ML (10ML) SYRINGE FOR IV PUSH (FOR BLOOD PRESSURE SUPPORT)
80.0000 ug | PREFILLED_SYRINGE | INTRAVENOUS | Status: DC | PRN
  Filled 2015-02-18: qty 20
  Filled 2015-02-18: qty 2

## 2015-02-18 MED ORDER — EPHEDRINE 5 MG/ML INJ
10.0000 mg | INTRAVENOUS | Status: DC | PRN
  Filled 2015-02-18: qty 2
  Filled 2015-02-18 (×2): qty 4

## 2015-02-18 MED ORDER — OXYCODONE-ACETAMINOPHEN 5-325 MG PO TABS: 1.0000 | ORAL_TABLET | ORAL | Status: DC | PRN

## 2015-02-18 MED ORDER — VANCOMYCIN HCL IN DEXTROSE 1-5 GM/200ML-% IV SOLN
1000.0000 mg | Freq: Two times a day (BID) | INTRAVENOUS | Status: DC
  Administered 2015-02-18: 1000 mg via INTRAVENOUS
  Filled 2015-02-18 (×2): qty 200

## 2015-02-18 MED ORDER — CITRIC ACID-SODIUM CITRATE 334-500 MG/5ML PO SOLN: 30.0000 mL | ORAL | Status: DC | PRN

## 2015-02-18 MED ORDER — FLEET ENEMA 7-19 GM/118ML RE ENEM: 1.0000 | ENEMA | RECTAL | Status: DC | PRN

## 2015-02-18 MED ORDER — DIPHENHYDRAMINE HCL 50 MG/ML IJ SOLN: 12.5000 mg | INTRAMUSCULAR | Status: DC | PRN

## 2015-02-18 NOTE — MAU Note (Signed)
Notified Dr. Mindi SlickerBanga patient presents with contractions, cervix 2 cm/50, BP elevated patient chronic hypertension has not taken medication, efm reactive, per Dr. Mindi SlickerBanga discharge patient.

## 2015-02-18 NOTE — MAU Note (Signed)
Notified provider that patient is G7P5. 4/60 cervical exam. Provider said she was in a delivery and would call back when she was done.

## 2015-02-18 NOTE — Anesthesia Procedure Notes (Signed)
Epidural Patient location during procedure: OB Start time: 02/18/2015 10:46 PM End time: 02/18/2015 10:50 PM  Staffing Anesthesiologist: Leilani AbleHATCHETT, Abel Hageman Performed by: anesthesiologist   Preanesthetic Checklist Completed: patient identified, surgical consent, pre-op evaluation, timeout performed, IV checked, risks and benefits discussed and monitors and equipment checked  Epidural Patient position: sitting Prep: site prepped and draped and DuraPrep Patient monitoring: continuous pulse ox and blood pressure Approach: midline Location: L3-L4 Injection technique: LOR air  Needle:  Needle type: Tuohy  Needle gauge: 17 G Needle length: 9 cm and 9 Needle insertion depth: 9 cm Catheter type: closed end flexible Catheter size: 19 Gauge Catheter at skin depth: 14 cm Test dose: negative and Other  Assessment Sensory level: T9 Events: blood not aspirated, injection not painful, no injection resistance, negative IV test and no paresthesia  Additional Notes Reason for block:procedure for pain

## 2015-02-18 NOTE — H&P (Addendum)
Mindy Wade [place] is a 34 y.o. female presenting for painful regular contractions. Pt noted to have made progress from 2 to 4cm from this am. Membranes were intact but spontaneously ruptured shortly after arriving on L/D. Pt is GBS + with a PCN allergy. Pt has a history of PE and has been on lovenox during this pregnancy- last dose at 9pm yesterday 12/22. Pt also with cHTN on labetalol and with anti E titers which were too low to titer at last check. She has also been anemic during pregnancy. Pt is appreciating fetal movements.   Maternal Medical History:  Reason for admission: Contractions.  Nausea.  Contractions: Onset was 3-5 hours ago.   Frequency: regular.   Perceived severity is moderate.    Fetal activity: Perceived fetal activity is normal.   Last perceived fetal movement was within the past hour.    Prenatal complications: PIH.   H/o PE- on lovenox  Prenatal Complications - Diabetes: none.    OB History    Gravida Para Term Preterm AB TAB SAB Ectopic Multiple Living   Past Medical History  Diagnosis Date  . Hx of varicella   . Breast feeding status of mother   . Hypertension   . Pregnancy induced hypertension   . Hx of blood clots    Past Surgical History  Procedure Laterality Date  . No past surgeries     Family History: family history includes Asthma in her mother; Cancer in her father; Heart disease in her mother; Hypertension in her mother; Stroke in her mother. There is no history of Alcohol abuse, Arthritis, Birth defects, COPD, Depression, Diabetes, Drug abuse, Early death, Hearing loss, Hyperlipidemia, Kidney disease, Learning disabilities, Mental illness, Mental retardation, Miscarriages / Stillbirths, Vision loss, or Varicose Veins. Social History:  reports that she has never smoked. She has never used smokeless tobacco. She reports that she drinks alcohol. She reports that she does not use illicit drugs.   Prenatal Transfer Tool   Maternal Diabetes: No Genetic Screening: Declined; late to care Maternal Ultrasounds/Referrals: Normal Fetal Ultrasounds or other Referrals:  None Maternal Substance Abuse:  No Significant Maternal Medications:  Meds include: Other: lovenox, labetalol Significant Maternal Lab Results:  Lab values include: Group B Strep positive Other Comments:  none  Review of Systems  Constitutional: Negative for fever, chills and malaise/fatigue.  Eyes: Negative for blurred vision.  Respiratory: Negative for shortness of breath.   Cardiovascular: Negative for chest pain and palpitations.  Gastrointestinal: Positive for abdominal pain. Negative for heartburn, nausea and vomiting.  Genitourinary: Negative for dysuria.  Musculoskeletal: Positive for back pain.  Neurological: Negative for headaches.  Psychiatric/Behavioral: The patient is not nervous/anxious.     Dilation: 4 Effacement (%): 60 Exam by:: Kayren Eaves RN  Blood pressure 162/115, pulse 91, temperature 98.5 F (36.9 C), temperature source Oral, resp. rate 20, last menstrual period 04/26/2014, unknown if currently breastfeeding. Maternal Exam:  Uterine Assessment: Contraction strength is moderate.  Contraction frequency is regular.   Abdomen: Patient reports generalized tenderness.  Estimated fetal weight is AGA.   Fetal presentation: vertex  Introitus: Normal vulva. Normal vagina.  Amniotic fluid character: clear.  Pelvis: adequate for delivery.   Cervix: Cervix evaluated by digital exam.     Physical Exam  Constitutional: She is oriented to person, place, and time. She appears well-developed and well-nourished.  HENT:  Head: Normocephalic.  Neck: Normal range of motion. Neck supple.  Cardiovascular: Normal rate.   Respiratory: Effort normal.  GI: There is generalized tenderness.  Genitourinary: Vagina normal and uterus normal.  Musculoskeletal: Normal range of motion.  Neurological: She is alert and oriented to person,  place, and time.  Skin: Skin is warm.  Psychiatric: She has a normal mood and affect. Her behavior is normal. Judgment and thought content normal.    Prenatal labs: ABO, Rh: A/Positive/-- (08/16 0000) Antibody: Positive (08/16 0000) Rubella: Immune (08/16 0000) RPR: Nonreactive (08/16 0000)  HBsAg: Negative (08/16 0000)  HIV: Non-reactive (08/16 0000)  GBS: Positive (12/06 0000)   Assessment/Plan: 39JW 34 female at 7338 1/[redacted]wks ga in active labor SROM - clear May have stadol for pain control now and epidural when anesthesia available; >12hrs sicne last dose of lovenox Cat 1 strip Vancomycin for GBS prophylaxis Anticipate svd  Sharol Givenecilia Worema Banga 02/18/2015, 10:23 PM

## 2015-02-18 NOTE — MAU Note (Signed)
Notified provider that patient's BP is elevated 171/72. Patient has chronic hypertension and has not taken her second dose of labetalol but is taking it now.

## 2015-02-18 NOTE — MAU Note (Signed)
Presents with contractions since around 5:30 am, denies vaginal bleeding, denies LOF, positive FM, chronic hypertension and on Lovenox for a previous DVT.

## 2015-02-18 NOTE — Anesthesia Preprocedure Evaluation (Signed)
Anesthesia Evaluation  Patient identified by MRN, date of birth, ID band Patient awake    Reviewed: Allergy & Precautions, H&P , Patient's Chart, lab work & pertinent test results  Airway Mallampati: III  TM Distance: >3 FB Neck ROM: full    Dental no notable dental hx.    Pulmonary neg pulmonary ROS,    Pulmonary exam normal        Cardiovascular hypertension, Pt. on medications and Pt. on home beta blockers Normal cardiovascular exam     Neuro/Psych negative neurological ROS  negative psych ROS   GI/Hepatic negative GI ROS, Neg liver ROS,   Endo/Other  Morbid obesity  Renal/GU negative Renal ROS     Musculoskeletal   Abdominal (+) + obese,   Peds  Hematology negative hematology ROS (+)   Anesthesia Other Findings   Reproductive/Obstetrics (+) Pregnancy                             Anesthesia Physical Anesthesia Plan  ASA: III  Anesthesia Plan: Epidural   Post-op Pain Management:    Induction:   Airway Management Planned:   Additional Equipment:   Intra-op Plan:   Post-operative Plan:   Informed Consent: I have reviewed the patients History and Physical, chart, labs and discussed the procedure including the risks, benefits and alternatives for the proposed anesthesia with the patient or authorized representative who has indicated his/her understanding and acceptance.     Plan Discussed with:   Anesthesia Plan Comments:         Anesthesia Quick Evaluation

## 2015-02-18 NOTE — MAU Note (Signed)
Patient given Sprite, explained need reactive efm strip.

## 2015-02-18 NOTE — MAU Note (Signed)
Patient presents with c/o ctxs since 1700 today. Patient says ctxs are 3 mins. apart

## 2015-02-18 NOTE — Discharge Instructions (Signed)
Fetal Movement Counts °Patient Name: __________________________________________________ Patient Due Date: ____________________ °Performing a fetal movement count is highly recommended in high-risk pregnancies, but it is good for every pregnant woman to do. Your health care provider may ask you to start counting fetal movements at 28 weeks of the pregnancy. Fetal movements often increase: °· After eating a full meal. °· After physical activity. °· After eating or drinking something sweet or cold. °· At rest. °Pay attention to when you feel the baby is most active. This will help you notice a pattern of your baby's sleep and wake cycles and what factors contribute to an increase in fetal movement. It is important to perform a fetal movement count at the same time each day when your baby is normally most active.  °HOW TO COUNT FETAL MOVEMENTS °1. Find a quiet and comfortable area to sit or lie down on your left side. Lying on your left side provides the best blood and oxygen circulation to your baby. °2. Write down the day and time on a sheet of paper or in a journal. °3. Start counting kicks, flutters, swishes, rolls, or jabs in a 2-hour period. You should feel at least 10 movements within 2 hours. °4. If you do not feel 10 movements in 2 hours, wait 2-3 hours and count again. Look for a change in the pattern or not enough counts in 2 hours. °SEEK MEDICAL CARE IF: °· You feel less than 10 counts in 2 hours, tried twice. °· There is no movement in over an hour. °· The pattern is changing or taking longer each day to reach 10 counts in 2 hours. °· You feel the baby is not moving as he or she usually does. °Date: ____________ Movements: ____________ Start time: ____________ Finish time: ____________  °Date: ____________ Movements: ____________ Start time: ____________ Finish time: ____________ °Date: ____________ Movements: ____________ Start time: ____________ Finish time: ____________ °Date: ____________ Movements:  ____________ Start time: ____________ Finish time: ____________ °Date: ____________ Movements: ____________ Start time: ____________ Finish time: ____________ °Date: ____________ Movements: ____________ Start time: ____________ Finish time: ____________ °Date: ____________ Movements: ____________ Start time: ____________ Finish time: ____________ °Date: ____________ Movements: ____________ Start time: ____________ Finish time: ____________  °Date: ____________ Movements: ____________ Start time: ____________ Finish time: ____________ °Date: ____________ Movements: ____________ Start time: ____________ Finish time: ____________ °Date: ____________ Movements: ____________ Start time: ____________ Finish time: ____________ °Date: ____________ Movements: ____________ Start time: ____________ Finish time: ____________ °Date: ____________ Movements: ____________ Start time: ____________ Finish time: ____________ °Date: ____________ Movements: ____________ Start time: ____________ Finish time: ____________ °Date: ____________ Movements: ____________ Start time: ____________ Finish time: ____________  °Date: ____________ Movements: ____________ Start time: ____________ Finish time: ____________ °Date: ____________ Movements: ____________ Start time: ____________ Finish time: ____________ °Date: ____________ Movements: ____________ Start time: ____________ Finish time: ____________ °Date: ____________ Movements: ____________ Start time: ____________ Finish time: ____________ °Date: ____________ Movements: ____________ Start time: ____________ Finish time: ____________ °Date: ____________ Movements: ____________ Start time: ____________ Finish time: ____________ °Date: ____________ Movements: ____________ Start time: ____________ Finish time: ____________  °Date: ____________ Movements: ____________ Start time: ____________ Finish time: ____________ °Date: ____________ Movements: ____________ Start time: ____________ Finish  time: ____________ °Date: ____________ Movements: ____________ Start time: ____________ Finish time: ____________ °Date: ____________ Movements: ____________ Start time: ____________ Finish time: ____________ °Date: ____________ Movements: ____________ Start time: ____________ Finish time: ____________ °Date: ____________ Movements: ____________ Start time: ____________ Finish time: ____________ °Date: ____________ Movements: ____________ Start time: ____________ Finish time: ____________  °Date: ____________ Movements: ____________ Start time: ____________ Finish   time: ____________ °Date: ____________ Movements: ____________ Start time: ____________ Finish time: ____________ °Date: ____________ Movements: ____________ Start time: ____________ Finish time: ____________ °Date: ____________ Movements: ____________ Start time: ____________ Finish time: ____________ °Date: ____________ Movements: ____________ Start time: ____________ Finish time: ____________ °Date: ____________ Movements: ____________ Start time: ____________ Finish time: ____________ °Date: ____________ Movements: ____________ Start time: ____________ Finish time: ____________  °Date: ____________ Movements: ____________ Start time: ____________ Finish time: ____________ °Date: ____________ Movements: ____________ Start time: ____________ Finish time: ____________ °Date: ____________ Movements: ____________ Start time: ____________ Finish time: ____________ °Date: ____________ Movements: ____________ Start time: ____________ Finish time: ____________ °Date: ____________ Movements: ____________ Start time: ____________ Finish time: ____________ °Date: ____________ Movements: ____________ Start time: ____________ Finish time: ____________ °Date: ____________ Movements: ____________ Start time: ____________ Finish time: ____________  °Date: ____________ Movements: ____________ Start time: ____________ Finish time: ____________ °Date: ____________  Movements: ____________ Start time: ____________ Finish time: ____________ °Date: ____________ Movements: ____________ Start time: ____________ Finish time: ____________ °Date: ____________ Movements: ____________ Start time: ____________ Finish time: ____________ °Date: ____________ Movements: ____________ Start time: ____________ Finish time: ____________ °Date: ____________ Movements: ____________ Start time: ____________ Finish time: ____________ °Date: ____________ Movements: ____________ Start time: ____________ Finish time: ____________  °Date: ____________ Movements: ____________ Start time: ____________ Finish time: ____________ °Date: ____________ Movements: ____________ Start time: ____________ Finish time: ____________ °Date: ____________ Movements: ____________ Start time: ____________ Finish time: ____________ °Date: ____________ Movements: ____________ Start time: ____________ Finish time: ____________ °Date: ____________ Movements: ____________ Start time: ____________ Finish time: ____________ °Date: ____________ Movements: ____________ Start time: ____________ Finish time: ____________ °  °This information is not intended to replace advice given to you by your health care provider. Make sure you discuss any questions you have with your health care provider. °  °Document Released: 03/14/2006 Document Revised: 03/05/2014 Document Reviewed: 12/10/2011 °Elsevier Interactive Patient Education ©2016 [employer] °Vaginal Delivery °During delivery, your health care provider will help you give birth to your baby. During a vaginal delivery, you will work to push the baby out of your vagina. However, before you can push your baby out, a few things need to happen. The opening of your uterus (cervix) has to soften, thin out, and open up (dilate) all the way to 10 cm. Also, your baby has to move down from the uterus into your vagina.  °SIGNS OF LABOR  °Your health care provider will first need to make sure you  are in labor. Signs of labor include:  °· Passing what is called the mucous plug before labor begins. This is a small amount of blood-stained mucus. °· Having regular, painful uterine contractions.   °· The time between contractions gets shorter.   °· The discomfort and pain gradually get more intense. °· Contraction pains get worse when walking and do not go away when resting.   °· Your cervix becomes thinner (effacement) and dilates. °BEFORE THE DELIVERY °Once you are in labor and admitted into the hospital or care center, your health care provider may do the following:  °5. Perform a complete physical exam. °6. Review any complications related to pregnancy or labor.  °7. Check your blood pressure, pulse, temperature, and heart rate (vital signs).   °8. Determine if, and when, the rupture of amniotic membranes occurred. °9. Do a vaginal exam (using a sterile glove and lubricant) to determine:   °1. The position (presentation) of the baby. Is the baby's head presenting first (vertex) in the birth canal (vagina), or are the feet or buttocks first (breech)?   °2. The level (station) of the baby's head within   the birth canal.   °3. The effacement and dilatation of the cervix.   °10. An electronic fetal monitor is usually placed on your abdomen when you first arrive. This is used to monitor your contractions and the baby's heart rate. °1. When the monitor is on your abdomen (external fetal monitor), it can only pick up the frequency and length of your contractions. It cannot tell the strength of your contractions. °2. If it becomes necessary for your health care provider to know exactly how strong your contractions are or to see exactly what the baby's heart rate is doing, an internal monitor may be inserted into your vagina and uterus. Your health care provider will discuss the benefits and risks of using an internal monitor and obtain your permission before inserting the device. °3. Continuous fetal monitoring may be  needed if you have an epidural, are receiving certain medicines (such as oxytocin), or have pregnancy or labor complications. °11. An IV access tube may be placed into a vein in your arm to deliver fluids and medicines if necessary. °THREE STAGES OF LABOR AND DELIVERY °Normal labor and delivery is divided into three stages. °First Stage °This stage starts when you begin to contract regularly and your cervix begins to efface and dilate. It ends when your cervix is completely open (fully dilated). The first stage is the longest stage of labor and can last from 3 hours to 15 hours.  °Several methods are available to help with labor pain. You and your health care provider will decide which option is best for you. Options include:  °· Opioid medicines. These are strong pain medicines that you can get through your IV tube or as a shot into your muscle. These medicines lessen pain but do not make it go away completely.  °· Epidural. A medicine is given through a thin tube that is inserted in your back. The medicine numbs the lower part of your body and prevents any pain in that area. °· Paracervical pain medicine. This is an injection of an anesthetic on each side of your cervix.   °· You may request natural childbirth, which does not involve the use of pain medicines or an epidural during labor and delivery. Instead, you will use other things, such as breathing exercises, to help cope with the pain. °Second Stage °The second stage of labor begins when your cervix is fully dilated at 10 cm. It continues until you push your baby down through the birth canal and the baby is born. This stage can take only minutes or several hours. °· The location of your baby's head as it moves through the birth canal is reported as a number called a station. If the baby's head has not started its descent, the station is described as being at minus 3 (-3). When your baby's head is at the zero station, it is at the middle of the birth canal  and is engaged in the pelvis. The station of your baby helps indicate the progress of the second stage of labor. °· When your baby is born, your health care provider may hold the baby with his or her head lowered to prevent amniotic fluid, mucus, and blood from getting into the baby's lungs. The baby's mouth and nose may be suctioned with a small bulb syringe to remove any additional fluid. °· Your health care provider may then place the baby on your stomach. It is important to keep the baby from getting cold. To do this, the health care provider will dry   the baby off, place the baby directly on your skin (with no blankets between you and the baby), and cover the baby with warm, dry blankets.   °· The umbilical cord is cut. °Third Stage °During the third stage of labor, your health care provider will deliver the placenta (afterbirth) and make sure your bleeding is under control. The delivery of the placenta usually takes about 5 minutes but can take up to 30 minutes. After the placenta is delivered, a medicine may be given either by IV or injection to help contract the uterus and control bleeding. If you are planning to breastfeed, you can try to do so now. °After you deliver the placenta, your uterus should contract and get very firm. If your uterus does not remain firm, your health care provider will massage it. This is important because the contraction of the uterus helps cut off bleeding at the site where the placenta was attached to your uterus. If your uterus does not contract properly and stay firm, you may continue to bleed heavily. If there is a lot of bleeding, medicines may be given to contract the uterus and stop the bleeding.  °  °This information is not intended to replace advice given to you by your health care provider. Make sure you discuss any questions you have with your health care provider. °  °Document Released: 11/22/2007 Document Revised: 03/05/2014 Document Reviewed: 10/10/2011 °Elsevier  Interactive Patient Education ©2016 [employer] ° °

## 2015-02-19 ENCOUNTER — Encounter (HOSPITAL_COMMUNITY): Payer: Self-pay | Admitting: Obstetrics and Gynecology

## 2015-02-19 LAB — CBC
HEMATOCRIT: 29 % — AB (ref 36.0–46.0)
Hemoglobin: 9.6 g/dL — ABNORMAL LOW (ref 12.0–15.0)
MCH: 27.7 pg (ref 26.0–34.0)
MCHC: 33.1 g/dL (ref 30.0–36.0)
MCV: 83.8 fL (ref 78.0–100.0)
Platelets: 211 10*3/uL (ref 150–400)
RBC: 3.46 MIL/uL — ABNORMAL LOW (ref 3.87–5.11)
RDW: 16.7 % — AB (ref 11.5–15.5)
WBC: 10.8 10*3/uL — AB (ref 4.0–10.5)

## 2015-02-19 LAB — RPR: RPR Ser Ql: NONREACTIVE

## 2015-02-19 LAB — CREATININE, SERUM
Creatinine, Ser: 0.56 mg/dL (ref 0.44–1.00)
GFR calc non Af Amer: >60 mL/min (ref >60)

## 2015-02-19 MED ORDER — IBUPROFEN 600 MG PO TABS
600.0000 mg | ORAL_TABLET | Freq: Four times a day (QID) | ORAL | Status: DC
  Administered 2015-02-19 – 2015-02-20 (×6): 600 mg via ORAL
  Filled 2015-02-19 (×6): qty 1

## 2015-02-19 MED ORDER — DIBUCAINE 1 % RE OINT: 1.0000 "application " | TOPICAL_OINTMENT | RECTAL | Status: DC | PRN

## 2015-02-19 MED ORDER — SENNOSIDES-DOCUSATE SODIUM 8.6-50 MG PO TABS
2.0000 | ORAL_TABLET | ORAL | Status: DC
  Administered 2015-02-19: 2 via ORAL
  Filled 2015-02-19: qty 2

## 2015-02-19 MED ORDER — OXYCODONE-ACETAMINOPHEN 5-325 MG PO TABS: 2.0000 | ORAL_TABLET | ORAL | Status: DC | PRN

## 2015-02-19 MED ORDER — ONDANSETRON HCL 4 MG PO TABS: 4.0000 mg | ORAL_TABLET | ORAL | Status: DC | PRN

## 2015-02-19 MED ORDER — PRENATAL MULTIVITAMIN CH
1.0000 | ORAL_TABLET | Freq: Every day | ORAL | Status: DC
  Administered 2015-02-19 – 2015-02-20 (×2): 1 via ORAL
  Filled 2015-02-19 (×2): qty 1

## 2015-02-19 MED ORDER — ENOXAPARIN SODIUM 60 MG/0.6ML ~~LOC~~ SOLN
60.0000 mg | SUBCUTANEOUS | Status: DC
  Administered 2015-02-19 – 2015-02-20 (×2): 60 mg via SUBCUTANEOUS
  Filled 2015-02-19 (×3): qty 0.6

## 2015-02-19 MED ORDER — WITCH HAZEL-GLYCERIN EX PADS: 1.0000 "application " | MEDICATED_PAD | CUTANEOUS | Status: DC | PRN

## 2015-02-19 MED ORDER — ZOLPIDEM TARTRATE 5 MG PO TABS: 5.0000 mg | ORAL_TABLET | Freq: Every evening | ORAL | Status: DC | PRN

## 2015-02-19 MED ORDER — BENZOCAINE-MENTHOL 20-0.5 % EX AERO: 1.0000 "application " | INHALATION_SPRAY | CUTANEOUS | Status: DC | PRN

## 2015-02-19 MED ORDER — ONDANSETRON HCL 4 MG/2ML IJ SOLN: 4.0000 mg | INTRAMUSCULAR | Status: DC | PRN

## 2015-02-19 MED ORDER — DIPHENHYDRAMINE HCL 25 MG PO CAPS: 25.0000 mg | ORAL_CAPSULE | Freq: Four times a day (QID) | ORAL | Status: DC | PRN

## 2015-02-19 MED ORDER — SIMETHICONE 80 MG PO CHEW: 80.0000 mg | CHEWABLE_TABLET | ORAL | Status: DC | PRN

## 2015-02-19 MED ORDER — ENOXAPARIN SODIUM 40 MG/0.4ML ~~LOC~~ SOLN: 40.0000 mg | SUBCUTANEOUS | Status: DC

## 2015-02-19 MED ORDER — TETANUS-DIPHTH-ACELL PERTUSSIS 5-2.5-18.5 LF-MCG/0.5 IM SUSP: 0.5000 mL | Freq: Once | INTRAMUSCULAR | Status: DC

## 2015-02-19 MED ORDER — ACETAMINOPHEN 325 MG PO TABS: 650.0000 mg | ORAL_TABLET | ORAL | Status: DC | PRN

## 2015-02-19 MED ORDER — OXYCODONE-ACETAMINOPHEN 5-325 MG PO TABS
1.0000 | ORAL_TABLET | ORAL | Status: DC | PRN
  Administered 2015-02-19: 1 via ORAL
  Filled 2015-02-19: qty 1

## 2015-02-19 MED ORDER — LANOLIN HYDROUS EX OINT: TOPICAL_OINTMENT | CUTANEOUS | Status: DC | PRN

## 2015-02-19 MED ORDER — LABETALOL HCL 200 MG PO TABS
200.0000 mg | ORAL_TABLET | Freq: Two times a day (BID) | ORAL | Status: DC
  Administered 2015-02-19 – 2015-02-20 (×4): 200 mg via ORAL
  Filled 2015-02-19 (×4): qty 1

## 2015-02-19 NOTE — Anesthesia Postprocedure Evaluation (Signed)
Anesthesia Post Note  Patient: Mindy Wade Mindy Wade  Procedure(s) Performed: * No procedures listed *  Patient location during evaluation: Mother Baby Anesthesia Type: Epidural Level of consciousness: awake Pain management: satisfactory to patient Vital Signs Assessment: post-procedure vital signs reviewed and stable Respiratory status: spontaneous breathing Cardiovascular status: stable Anesthetic complications: no    Last Vitals:  Filed Vitals:   02/19/15 0323 02/19/15 0632  BP: 143/79 138/89  Pulse: 69 63  Temp: 37 C 36.9 C  Resp: 18 18    Last Pain:  Filed Vitals:   02/19/15 0641  PainSc: 0-No pain                 Natha Guin

## 2015-02-19 NOTE — Lactation Note (Signed)
This note was copied from the chart of Mindy Jorge [place] Wade. Lactation Consultation Note  Patient Name: Mindy Wade Today's Date: 02/19/2015 Reason for consult: Initial assessment;Hyperbilirubinemia  Baby is bili high for age - placed on double photo tx. Per mom started out breast feeding right after birth and baby fed 30 mins. Since started bottles and thought since she only fed her last baby short time  Would formula feed. Mom when on to ask mom if she breast feed would  There be benefits  To decrease the jaundice quicker. LC shared the benefits of breast milk especially cleaning out the gut  Quicker than formula. Since mom recently had tried to bottle feed and baby sleepy, LC suggested if she decided To latch the baby to call for assist on nurses light and either MBU RN or LC could assist her. Also if she desired To continue to breast  Feed and enhance her milk coming in to have MBU RN set up DEBP for post pumping  And feed it back to her baby.  Mother informed of post-discharge support and given phone number to the lactation department, including services for  phone call assistance; out-patient appointments; and breastfeeding support group. List of other breastfeeding resources  in the community given in the handout. Encouraged mother to call for problems or concerns related to breastfeeding.   Maternal Data    Feeding Feeding Type:  (per mom recently fed  baby sips bottle - baby not interested)  LATCH Score/Interventions                      Lactation Tools Discussed/Used     Consult Status Consult Status: Follow-up Date: 02/19/15 Follow-up type: In-patient    Mindy Wade, Mindy Wade 02/19/2015, 3:50 PM

## 2015-02-19 NOTE — Progress Notes (Signed)
Patient ID: Mindy Wade, female   DOB: [birth year], 34 y.o.   MRN: [id number] Pt s/e this am. Feels well with no complaints. Lochia moderate. Pain well controlled. Bonding well with baby- bottlefeeding. Denies any fever/chills or lightheadedness VSS  A/P: [id number] on PPD # 1 s/p svd- stable        Routine pp care        Circ later today        Discharge home tomorrow

## 2015-02-20 MED ORDER — OXYCODONE-ACETAMINOPHEN 5-325 MG PO TABS: 1.0000 | ORAL_TABLET | ORAL | Status: DC | PRN

## 2015-02-20 NOTE — Discharge Summary (Signed)
Obstetric Discharge Summary Reason for Admission: onset of labor Prenatal Procedures: lovenox for h/o PE Intrapartum Procedures: spontaneous vaginal delivery and GBS prophylaxis Postpartum Procedures: lovenox Complications-Operative and Postpartum: none HEMOGLOBIN  Date Value Ref Range Status  02/19/2015 9.6* 12.0 - 15.0 g/dL Final   HCT  Date Value Ref Range Status  02/19/2015 29.0* 36.0 - 46.0 % Final    Physical Exam:  General: alert and cooperative Lochia: appropriate Uterine Fundus: firm  Discharge Diagnoses: Term Pregnancy-delivered                                          H/o DVT on Lovenox                                         CHTN well-controlled on medications  Discharge Information: Date: 02/20/2015 Activity: pelvic rest Diet: routine Medications: Percocet Condition: improved Instructions: refer to practice specific booklet Discharge to: home Follow-up Information    Follow up with Mindy Wade. Schedule an appointment as soon as possible for a visit in 6 weeks.   Specialty:  Obstetrics and Gynecology   Why:  postpartum   Contact information:   [address] [place] [id number] [phone]       Newborn Data: Live born female  Birth Weight: 7 lb 0.9 oz (3200 g) APGAR: 9, 9  Home with mother.  Mindy Wade 02/20/2015, 9:30 AM

## 2015-02-20 NOTE — Progress Notes (Addendum)
Post Partum Day 2 Subjective: No c/o feeling well, baby under bililights.  Bottlefeeding  Objective: Blood pressure 131/84, pulse 75, temperature 97.6 F (36.4 C), temperature source Oral, resp. rate 16, last menstrual period 04/26/2014, SpO2 100 %, unknown if currently breastfeeding.  Physical Exam:  General: alert and cooperative Lochia: appropriate Uterine Fundus: firm }   Recent Labs  02/18/15 2138 02/19/15 0530  HGB 10.4* 9.6*  HCT 31.9* 29.0*    Assessment/Plan: Discharge home  D/w pt baby patient status and need to get scripts if baby must stay Continue labetalol, BP well-controlled Continue lovenox until 6 weeks postpartum   LOS: 2 days   Mindy Wade W 02/20/2015, 9:21 AM

## 2015-02-21 LAB — TYPE AND SCREEN
ABO/RH(D): A POS
ANTIBODY SCREEN: POSITIVE
DAT, IGG: NEGATIVE
DONOR AG TYPE: NEGATIVE
Donor AG Type: NEGATIVE
Unit division: 0
Unit division: 0

## 2015-02-25 ENCOUNTER — Inpatient Hospital Stay (HOSPITAL_COMMUNITY): Admission: RE | Admit: 2015-02-25 | Payer: 59 | Source: Ambulatory Visit

## 2015-04-20 ENCOUNTER — Emergency Department (HOSPITAL_COMMUNITY)
Admission: EM | Admit: 2015-04-20 | Discharge: 2015-04-20 | Disposition: A | Discharge status: Home / Self Care | Payer: 59 | Attending: Emergency Medicine | Admitting: Emergency Medicine

## 2015-04-20 ENCOUNTER — Encounter (HOSPITAL_COMMUNITY): Payer: Self-pay | Admitting: Emergency Medicine

## 2015-04-20 ENCOUNTER — Emergency Department (HOSPITAL_COMMUNITY): Payer: 59

## 2015-04-20 DIAGNOSIS — Z880 Allergy status to penicillin: Secondary | ICD-10-CM | POA: Insufficient documentation

## 2015-04-20 DIAGNOSIS — R0981 Nasal congestion: Secondary | ICD-10-CM | POA: Diagnosis not present

## 2015-04-20 DIAGNOSIS — I10 Essential (primary) hypertension: Secondary | ICD-10-CM | POA: Insufficient documentation

## 2015-04-20 DIAGNOSIS — Z86718 Personal history of other venous thrombosis and embolism: Secondary | ICD-10-CM | POA: Insufficient documentation

## 2015-04-20 DIAGNOSIS — R05 Cough: Secondary | ICD-10-CM | POA: Insufficient documentation

## 2015-04-20 DIAGNOSIS — Z8619 Personal history of other infectious and parasitic diseases: Secondary | ICD-10-CM | POA: Insufficient documentation

## 2015-04-20 DIAGNOSIS — R6889 Other general symptoms and signs: Secondary | ICD-10-CM

## 2015-04-20 DIAGNOSIS — R000 Tachycardia, unspecified: Secondary | ICD-10-CM | POA: Insufficient documentation

## 2015-04-20 DIAGNOSIS — R509 Fever, unspecified: Secondary | ICD-10-CM | POA: Insufficient documentation

## 2015-04-20 DIAGNOSIS — R51 Headache: Secondary | ICD-10-CM | POA: Insufficient documentation

## 2015-04-20 DIAGNOSIS — Z79899 Other long term (current) drug therapy: Secondary | ICD-10-CM | POA: Diagnosis not present

## 2015-04-20 DIAGNOSIS — J3489 Other specified disorders of nose and nasal sinuses: Secondary | ICD-10-CM | POA: Diagnosis not present

## 2015-04-20 DIAGNOSIS — J029 Acute pharyngitis, unspecified: Secondary | ICD-10-CM | POA: Insufficient documentation

## 2015-04-20 DIAGNOSIS — Z8701 Personal history of pneumonia (recurrent): Secondary | ICD-10-CM | POA: Diagnosis not present

## 2015-04-20 MED ORDER — MAGIC MOUTHWASH W/LIDOCAINE: 5.0000 mL | Freq: Four times a day (QID) | ORAL | Status: DC | PRN

## 2015-04-20 MED ORDER — LABETALOL HCL 200 MG PO TABS
200.0000 mg | ORAL_TABLET | Freq: Two times a day (BID) | ORAL | Status: AC
  Administered 2015-04-20: 200 mg via ORAL
  Filled 2015-04-20: qty 1

## 2015-04-20 MED ORDER — IBUPROFEN 800 MG PO TABS
800.0000 mg | ORAL_TABLET | Freq: Once | ORAL | Status: AC
  Administered 2015-04-20: 800 mg via ORAL
  Filled 2015-04-20: qty 1

## 2015-04-20 MED ORDER — BENZONATATE 100 MG PO CAPS: 100.0000 mg | ORAL_CAPSULE | Freq: Three times a day (TID) | ORAL | Status: DC | PRN

## 2015-04-20 NOTE — ED Notes (Signed)
Pt states her sxs started on Sunday with burning in her chest and since she has been running a fever, cough, runny nose, congestion, headache, and chills

## 2015-04-20 NOTE — ED Provider Notes (Signed)
CSN: [id number]     Arrival date & time 04/20/15  1914 History  By signing my name below, I, [facility], attest that this documentation has been prepared under the direction and in the presence of [employer], PA-C. Electronically Signed: Randell Patient, ED Scribe. 04/20/2015. 9:19 PM.   Chief Complaint  Patient presents with  . Fever  . Cough   The history is provided by the patient. No language interpreter was used.   HPI Comments: Mindy Wade is a 35 y.o. female who presents to the Emergency Department complaining of intermittent, mild fevers TMAX 103 onset 3 days ago. Patient reports burning sensation in her chest 3 days ago, followed by fevers, chills, HAs right before her fever spikes, and sore throat. She endorses associated cough, congestion, and rhinorrhea that began earlier today. She has taken Dayquil, Nyquil, and Tylenol with temporary relief. She notes recent sick contacts with her coworkers. She denies any other symptoms currently.   Past Medical History  Diagnosis Date  . Hx of varicella   . Breast feeding status of mother   . Hypertension   . Pregnancy induced hypertension   . Hx of blood clots    Past Surgical History  Procedure Laterality Date  . No past surgeries     Family History  Problem Relation Age of Onset  . Asthma Mother   . Heart disease Mother   . Hypertension Mother   . Stroke Mother   . Cancer Father     prostate  . Alcohol abuse Neg Hx   . Arthritis Neg Hx   . Birth defects Neg Hx   . COPD Neg Hx   . Depression Neg Hx   . Diabetes Neg Hx   . Drug abuse Neg Hx   . Early death Neg Hx   . Hearing loss Neg Hx   . Hyperlipidemia Neg Hx   . Kidney disease Neg Hx   . Learning disabilities Neg Hx   . Mental illness Neg Hx   . Mental retardation Neg Hx   . Miscarriages / Stillbirths Neg Hx   . Vision loss Neg Hx   . Varicose Veins Neg Hx    Social History  Substance Use Topics  . Smoking status: Never Smoker   .  Smokeless tobacco: Never Used  . Alcohol Use: No   OB History    Gravida Para Term Preterm AB TAB SAB Ectopic Multiple Living   0 6     Review of Systems  Constitutional: Positive for fever and chills.  HENT: Positive for congestion, rhinorrhea and sore throat.   Respiratory: Positive for cough.   Neurological: Positive for headaches.  All other systems reviewed and are negative.   Allergies  Penicillins  Home Medications   Prior to Admission medications   Medication Sig Start Date End Date Taking? Authorizing Provider  benzonatate (TESSALON) 100 MG capsule Take 1 capsule (100 mg total) by mouth 3 (three) times daily as needed for cough. 04/20/15   Antony Madura, PA-C  enoxaparin (LOVENOX) 60 MG/0.6ML injection Inject 60 mg into the skin daily.    Historical Provider, MD  ferrous sulfate 325 (65 FE) MG tablet Take 325 mg by mouth 2 (two) times daily with a meal.    Historical Provider, MD  labetalol (NORMODYNE) 200 MG tablet Take 200 mg by mouth 2 (two) times daily.    Historical Provider, MD  magic mouthwash w/lidocaine SOLN Take 5  mLs by mouth 4 (four) times daily as needed for mouth pain (gargle and swallow). 04/20/15   Antony Madura, PA-C  oxyCODONE-acetaminophen (PERCOCET/ROXICET) 5-325 MG tablet Take 1 tablet by mouth every 4 (four) hours as needed (for pain scale 4-7). 02/20/15   Huel Cote, MD  Prenatal Vit-Fe Fumarate-FA (PRENATAL MULTIVITAMIN) TABS tablet Take 1 tablet by mouth daily at 12 noon.    Historical Provider, MD   BP 167/126 mmHg  Pulse 111  Temp(Src) 98.7 F (37.1 C) (Oral)  Resp 20  SpO2 99%  LMP 04/13/2015 (Exact Date)   Physical Exam  Constitutional: She is oriented to person, place, and time. She appears well-developed and well-nourished. No distress.  Pleasant, nontoxic/nonseptic appearing  HENT:  Head: Normocephalic and atraumatic.  Right Ear: Tympanic membrane, external ear and ear canal normal.  Left Ear: External ear and ear canal  normal.  Mouth/Throat: Oropharynx is clear and moist. No oropharyngeal exudate.  No posterior oropharyngeal exudates or edema. Uvula midline. Patient tolerating secretions without difficulty. B/l TMs obscured by cerumen.  Eyes: Conjunctivae and EOM are normal. No scleral icterus.  Neck: Normal range of motion. Neck supple. No tracheal deviation present.  No nuchal rigidity or meningismus  Cardiovascular:  Mild tachycardia  Pulmonary/Chest: Effort normal and breath sounds normal. No respiratory distress. She has no wheezes. She has no rales.  Lungs CTAB  Musculoskeletal: Normal range of motion.  Neurological: She is alert and oriented to person, place, and time. She exhibits normal muscle tone. Coordination normal.  Patient moves extremities without ataxia  Skin: Skin is warm and dry. No rash noted. She is not diaphoretic. No erythema. No pallor.  Psychiatric: She has a normal mood and affect. Her behavior is normal.  Nursing note and vitals reviewed.   ED Course  Procedures   DIAGNOSTIC STUDIES: Oxygen Saturation is 99% on RA, normal by my interpretation.    COORDINATION OF CARE: 8:35 PM Will order labatalol, ibuprofen, and a chest x-ray. Discussed treatment plan with pt at bedside and pt agreed to plan.  Labs Review Labs Reviewed - No data to display  Imaging Review Dg Chest 2 View  04/20/2015  CLINICAL DATA:  Fevers for 3 days EXAM: CHEST  2 VIEW COMPARISON:  05/11/2014, 10/21/2013 FINDINGS: Cardiac shadow is stable. The lungs are clear bilaterally. No sizable effusion is noted. No acute bony abnormality is seen. IMPRESSION: No active cardiopulmonary disease. Electronically Signed   By: Alcide Clever M.D.   On: 04/20/2015 21:10   I have personally reviewed and evaluated these images and lab results as part of my medical decision-making.   EKG Interpretation None      MDM   Final diagnoses:  Flu-like symptoms  Essential hypertension    Patient with symptoms consistent  with influenza. Vitals are stable, patient afebrile.  No signs of dehydration, tolerating PO's. Lungs are clear. CXR negative for PNA. Discussed the cost versus benefit of Tamiflu treatment with the patient. The patient understands that symptoms are greater than the recommended 24-48 hour window of treatment. Patient will be discharged with symptomatic treatment. Patient will also be given a cough suppressant.   She has been told to follow-up with her primary care doctor for recheck of symptoms as well as for recheck of her blood pressure. Patient given her nightly blood pressure medication in the ED as she missed this dose tonight. This is likely the cause of her hypertension. Return precautions given at discharge. Patient agreeable to plan with no unaddressed concerns. Patient  discharged in good condition.  I personally performed the services described in this documentation, which was scribed in my presence. The recorded information has been reviewed and is accurate.      Antony Madura, PA-C 04/20/15 2122  Tilden Fossa, MD 04/22/15 [phone]

## 2015-04-20 NOTE — Discharge Instructions (Signed)

## 2015-05-02 ENCOUNTER — Emergency Department (HOSPITAL_COMMUNITY): Payer: 59

## 2015-05-02 ENCOUNTER — Emergency Department (HOSPITAL_COMMUNITY)
Admission: EM | Admit: 2015-05-02 | Discharge: 2015-05-02 | Disposition: A | Discharge status: Home / Self Care | Payer: 59 | Attending: Emergency Medicine | Admitting: Emergency Medicine

## 2015-05-02 ENCOUNTER — Encounter (HOSPITAL_COMMUNITY): Payer: Self-pay | Admitting: Emergency Medicine

## 2015-05-02 DIAGNOSIS — Z79899 Other long term (current) drug therapy: Secondary | ICD-10-CM | POA: Diagnosis not present

## 2015-05-02 DIAGNOSIS — R197 Diarrhea, unspecified: Secondary | ICD-10-CM | POA: Diagnosis not present

## 2015-05-02 DIAGNOSIS — R112 Nausea with vomiting, unspecified: Secondary | ICD-10-CM | POA: Diagnosis not present

## 2015-05-02 DIAGNOSIS — I10 Essential (primary) hypertension: Secondary | ICD-10-CM | POA: Insufficient documentation

## 2015-05-02 DIAGNOSIS — Z8639 Personal history of other endocrine, nutritional and metabolic disease: Secondary | ICD-10-CM | POA: Insufficient documentation

## 2015-05-02 DIAGNOSIS — Z880 Allergy status to penicillin: Secondary | ICD-10-CM | POA: Insufficient documentation

## 2015-05-02 DIAGNOSIS — R079 Chest pain, unspecified: Secondary | ICD-10-CM | POA: Diagnosis not present

## 2015-05-02 HISTORY — DX: Hyperlipidemia, unspecified: E78.5

## 2015-05-02 LAB — BASIC METABOLIC PANEL
ANION GAP: 11 (ref 5–15)
BUN: 6 mg/dL (ref 6–20)
CALCIUM: 9.2 mg/dL (ref 8.9–10.3)
CHLORIDE: 105 mmol/L (ref 101–111)
CO2: 25 mmol/L (ref 22–32)
CREATININE: 0.65 mg/dL (ref 0.44–1.00)
GFR calc non Af Amer: >60 mL/min (ref >60)
Glucose, Bld: 92 mg/dL (ref 65–99)
Potassium: 3.8 mmol/L (ref 3.5–5.1)
Sodium: 141 mmol/L (ref 135–145)

## 2015-05-02 LAB — CBC
HEMATOCRIT: 39.1 % (ref 36.0–46.0)
HEMOGLOBIN: 13.1 g/dL (ref 12.0–15.0)
MCH: 28.4 pg (ref 26.0–34.0)
MCHC: 33.5 g/dL (ref 30.0–36.0)
MCV: 84.8 fL (ref 78.0–100.0)
Platelets: 322 10*3/uL (ref 150–400)
RBC: 4.61 MIL/uL (ref 3.87–5.11)
RDW: 16.6 % — ABNORMAL HIGH (ref 11.5–15.5)
WBC: 5.8 10*3/uL (ref 4.0–10.5)

## 2015-05-02 LAB — D-DIMER, QUANTITATIVE (NOT AT ARMC)

## 2015-05-02 LAB — I-STAT TROPONIN, ED: Troponin i, poc: 0.01 ng/mL (ref 0.00–0.08)

## 2015-05-02 NOTE — Discharge Instructions (Signed)

## 2015-05-02 NOTE — ED Notes (Signed)
Patient transported to x-ray. ?

## 2015-05-02 NOTE — ED Notes (Signed)
[facility] with c/o chest discomfort starting today and N/V/D x3 days. Pt has hx of PEs. Last vitals 170/108, HR 86, RR 18, 100% on 2L, CBG 105. Pt given 324 asp and a 20G in left AC.

## 2015-05-02 NOTE — ED Provider Notes (Signed)
CSN: [id number]     Arrival date & time 05/02/15  1922 History   First MD Initiated Contact with Patient 05/02/15 1933     Chief Complaint  Patient presents with  . Chest Pain     (Consider location/radiation/quality/duration/timing/severity/associated sxs/prior Treatment) HPI Comments: Patient presents with chest pain. Patient states she's had nausea vomiting and diarrhea for about 3 days. Her symptoms are better since yesterday. She's having no further vomiting or diarrhea. She's tolerating by mouth fluids. With her PCP today and during questioning, the patient had noted that she's been having some intermittent chest pain throughout today. She describes as a tightness across her left chest. Nonradiating. It is nonpleuritic and nonexertional.  Denies any cough or chest congestion. She denies any past history of heart problems. She has had a past PE about 2 years ago. She was on Coumadin but is no longer on anticoagulants. She denies any leg pain or swelling. She's not on estrogens. She denies any recent immobilization or tobacco use.  Patient is a 35 y.o. female presenting with chest pain.  Chest Pain Associated symptoms: nausea and vomiting   Associated symptoms: no abdominal pain, no back pain, no cough, no diaphoresis, no dizziness, no fatigue, no fever, no headache, no numbness, no shortness of breath and no weakness     Past Medical History  Diagnosis Date  . Hx of varicella   . Breast feeding status of mother   . Hypertension   . Pregnancy induced hypertension   . Hx of blood clots   . Hyperlipemia    Past Surgical History  Procedure Laterality Date  . No past surgeries     Family History  Problem Relation Age of Onset  . Asthma Mother   . Heart disease Mother   . Hypertension Mother   . Stroke Mother   . Cancer Father     prostate  . Alcohol abuse Neg Hx   . Arthritis Neg Hx   . Birth defects Neg Hx   . COPD Neg Hx   . Depression Neg Hx   . Diabetes Neg Hx   . Drug  abuse Neg Hx   . Early death Neg Hx   . Hearing loss Neg Hx   . Hyperlipidemia Neg Hx   . Kidney disease Neg Hx   . Learning disabilities Neg Hx   . Mental illness Neg Hx   . Mental retardation Neg Hx   . Miscarriages / Stillbirths Neg Hx   . Vision loss Neg Hx   . Varicose Veins Neg Hx    Social History  Substance Use Topics  . Smoking status: Never Smoker   . Smokeless tobacco: Never Used  . Alcohol Use: No   OB History    Gravida Para Term Preterm AB TAB SAB Ectopic Multiple Living   0 6     Review of Systems  Constitutional: Negative for fever, chills, diaphoresis and fatigue.  HENT: Negative for congestion, rhinorrhea and sneezing.   Eyes: Negative.   Respiratory: Negative for cough, chest tightness and shortness of breath.   Cardiovascular: Positive for chest pain. Negative for leg swelling.  Gastrointestinal: Positive for nausea, vomiting and diarrhea. Negative for abdominal pain and blood in stool.  Genitourinary: Negative for frequency, hematuria, flank pain and difficulty urinating.  Musculoskeletal: Negative for back pain and arthralgias.  Skin: Negative for rash.  Neurological: Negative for dizziness, speech difficulty, weakness, numbness and headaches.  Allergies  Penicillins  Home Medications   Prior to Admission medications   Medication Sig Start Date End Date Taking? Authorizing Provider  amLODipine (NORVASC) 10 MG tablet Take 10 mg by mouth daily.  04/26/15 04/25/16 Yes Historical Provider, MD  ferrous sulfate 325 (65 FE) MG tablet Take 325 mg by mouth 2 (two) times daily with a meal.   Yes Historical Provider, MD  labetalol (NORMODYNE) 200 MG tablet Take 200 mg by mouth 2 (two) times daily. Reported on 05/02/2015   Yes Historical Provider, MD  benzonatate (TESSALON) 100 MG capsule Take 1 capsule (100 mg total) by mouth 3 (three) times daily as needed for cough. Patient not taking: Reported on 05/02/2015 04/20/15   Antony Madura, PA-C  magic  mouthwash w/lidocaine SOLN Take 5 mLs by mouth 4 (four) times daily as needed for mouth pain (gargle and swallow). Patient not taking: Reported on 05/02/2015 04/20/15   Antony Madura, PA-C  oxyCODONE-acetaminophen (PERCOCET/ROXICET) 5-325 MG tablet Take 1 tablet by mouth every 4 (four) hours as needed (for pain scale 4-7). Patient not taking: Reported on 05/02/2015 02/20/15   Huel Cote, MD   BP 133/93 mmHg  Pulse 59  Temp(Src) 98.8 F (37.1 C) (Oral)  Resp 17  Ht  (1.727 m)  Wt 264 lb (119.75 kg)  BMI 40.15 kg/m2  SpO2 100%  LMP 04/13/2015 (Exact Date) Physical Exam  Constitutional: She is oriented to person, place, and time. She appears well-developed and well-nourished.  HENT:  Head: Normocephalic and atraumatic.  Eyes: Pupils are equal, round, and reactive to light.  Neck: Normal range of motion. Neck supple.  Cardiovascular: Normal rate, regular rhythm and normal heart sounds.   Pulmonary/Chest: Effort normal and breath sounds normal. No respiratory distress. She has no wheezes. She has no rales. She exhibits no tenderness.  Abdominal: Soft. Bowel sounds are normal. There is no tenderness. There is no rebound and no guarding.  Musculoskeletal: Normal range of motion. She exhibits no edema.  No edema or calf tenderness  Lymphadenopathy:    She has no cervical adenopathy.  Neurological: She is alert and oriented to person, place, and time.  Skin: Skin is warm and dry. No rash noted.  Psychiatric: She has a normal mood and affect.    ED Course  Procedures (including critical care time) Labs Review Labs Reviewed  CBC - Abnormal; Notable for the following:    RDW 16.6 (*)    All other components within normal limits  BASIC METABOLIC PANEL  D-DIMER, QUANTITATIVE (NOT AT [facility])  Rosezena Sensor, ED    Imaging Review Dg Chest 2 View  05/02/2015  CLINICAL DATA:  Chest pain EXAM: CHEST  2 VIEW COMPARISON:  04/20/2015 FINDINGS: The heart size and mediastinal contours are  within normal limits. Both lungs are clear. The visualized skeletal structures are unremarkable. IMPRESSION: No active cardiopulmonary disease. Electronically Signed   By: Marlan Palau M.D.   On: 05/02/2015 20:11   I have personally reviewed and evaluated these images and lab results as part of my medical decision-making.   EKG Interpretation   Date/Time:  Monday May 02 2015 19:37:43 EST Ventricular Rate:  69 PR Interval:  157 QRS Duration: 91 QT Interval:  405 QTC Calculation: 434 R Axis:   43 Text Interpretation:  Sinus rhythm Ventricular premature complex Consider  left ventricular hypertrophy Borderline T abnormalities, lateral leads  similar to EKG from 01/22/09 Confirmed by Laakea Pereira  MD, Oneill Bais ([id number]) on  05/02/2015 8:13:09 PM  MDM   Final diagnoses:  Chest pain, unspecified chest pain type    Patient's labs are unremarkable. Her EKG does not show evidence of acute ischemia. Her troponin is negative. Her d-dimer is negative and she has no other suggestions of PE or DVT. She is pain-free currently. She was discharged home in good condition. She was encouraged to make a follow-up appointment with her PCP. Return precautions were given.    Rolan BuccoMelanie Datron Brakebill, MD 05/02/15 2135

## 2016-09-11 ENCOUNTER — Other Ambulatory Visit: Payer: Self-pay | Admitting: Orthopedic Surgery

## 2016-09-11 DIAGNOSIS — M25561 Pain in right knee: Secondary | ICD-10-CM

## 2016-09-23 ENCOUNTER — Ambulatory Visit
Admission: RE | Admit: 2016-09-23 | Discharge: 2016-09-23 | Disposition: A | Discharge status: Home / Self Care | Payer: 59 | Source: Ambulatory Visit | Attending: Orthopedic Surgery | Admitting: Orthopedic Surgery

## 2016-09-23 DIAGNOSIS — M25561 Pain in right knee: Secondary | ICD-10-CM

## 2017-05-03 ENCOUNTER — Inpatient Hospital Stay (HOSPITAL_COMMUNITY)
Admission: AD | Admit: 2017-05-03 | Discharge: 2017-05-03 | Disposition: A | Discharge status: Home / Self Care | Payer: 59 | Source: Ambulatory Visit | Attending: Obstetrics and Gynecology | Admitting: Obstetrics and Gynecology

## 2017-05-03 ENCOUNTER — Encounter (HOSPITAL_COMMUNITY): Payer: Self-pay | Admitting: *Deleted

## 2017-05-03 ENCOUNTER — Other Ambulatory Visit: Payer: Self-pay

## 2017-05-03 ENCOUNTER — Inpatient Hospital Stay (HOSPITAL_COMMUNITY): Payer: 59

## 2017-05-03 DIAGNOSIS — Z86718 Personal history of other venous thrombosis and embolism: Secondary | ICD-10-CM | POA: Insufficient documentation

## 2017-05-03 DIAGNOSIS — E785 Hyperlipidemia, unspecified: Secondary | ICD-10-CM | POA: Insufficient documentation

## 2017-05-03 DIAGNOSIS — O161 Unspecified maternal hypertension, first trimester: Secondary | ICD-10-CM | POA: Diagnosis not present

## 2017-05-03 DIAGNOSIS — O99281 Endocrine, nutritional and metabolic diseases complicating pregnancy, first trimester: Secondary | ICD-10-CM | POA: Insufficient documentation

## 2017-05-03 DIAGNOSIS — Z3A01 Less than 8 weeks gestation of pregnancy: Secondary | ICD-10-CM | POA: Diagnosis not present

## 2017-05-03 DIAGNOSIS — O021 Missed abortion: Secondary | ICD-10-CM | POA: Diagnosis present

## 2017-05-03 DIAGNOSIS — R109 Unspecified abdominal pain: Secondary | ICD-10-CM | POA: Diagnosis present

## 2017-05-03 DIAGNOSIS — N939 Abnormal uterine and vaginal bleeding, unspecified: Secondary | ICD-10-CM | POA: Diagnosis present

## 2017-05-03 DIAGNOSIS — O4690 Antepartum hemorrhage, unspecified, unspecified trimester: Secondary | ICD-10-CM

## 2017-05-03 LAB — WET PREP, GENITAL
CLUE CELLS WET PREP: NONE SEEN
Sperm: NONE SEEN
Trich, Wet Prep: NONE SEEN
Yeast Wet Prep HPF POC: NONE SEEN

## 2017-05-03 LAB — CBC
HCT: 33.6 % — ABNORMAL LOW (ref 36.0–46.0)
Hemoglobin: 11.6 g/dL — ABNORMAL LOW (ref 12.0–15.0)
MCH: 29.7 pg (ref 26.0–34.0)
MCHC: 34.5 g/dL (ref 30.0–36.0)
MCV: 85.9 fL (ref 78.0–100.0)
PLATELETS: 302 10*3/uL (ref 150–400)
RBC: 3.91 MIL/uL (ref 3.87–5.11)
RDW: 15.2 % (ref 11.5–15.5)
WBC: 7.1 10*3/uL (ref 4.0–10.5)

## 2017-05-03 LAB — HCG, QUANTITATIVE, PREGNANCY: hCG, Beta Chain, Quant, S: 39178 m[IU]/mL — ABNORMAL HIGH (ref <5)

## 2017-05-03 LAB — URINALYSIS, ROUTINE W REFLEX MICROSCOPIC
Bilirubin Urine: NEGATIVE
GLUCOSE, UA: NEGATIVE mg/dL
KETONES UR: NEGATIVE mg/dL
LEUKOCYTES UA: NEGATIVE
Nitrite: NEGATIVE
PH: 6 (ref 5.0–8.0)
Protein, ur: NEGATIVE mg/dL
Specific Gravity, Urine: 1.018 (ref 1.005–1.030)

## 2017-05-03 LAB — POCT PREGNANCY, URINE: Preg Test, Ur: POSITIVE — AB

## 2017-05-03 MED ORDER — ACETAMINOPHEN-CODEINE #3 300-30 MG PO TABS: 1.0000 | ORAL_TABLET | Freq: Four times a day (QID) | ORAL | 0 refills | Status: DC | PRN

## 2017-05-03 MED ORDER — MISOPROSTOL 200 MCG PO TABS: 800.0000 ug | ORAL_TABLET | Freq: Once | ORAL | 0 refills | Status: DC

## 2017-05-03 MED ORDER — IBUPROFEN 800 MG PO TABS: 800.0000 mg | ORAL_TABLET | Freq: Three times a day (TID) | ORAL | 0 refills | Status: DC | PRN

## 2017-05-03 MED ORDER — PROMETHAZINE HCL 12.5 MG PO TABS: 12.5000 mg | ORAL_TABLET | ORAL | 0 refills | Status: DC | PRN

## 2017-05-03 NOTE — Discharge Instructions (Signed)
Return to Maternity Admission Unit if you are soaking 1-2 pads every hour or if pain is not controlled by the medications prescribed.

## 2017-05-03 NOTE — MAU Provider Note (Signed)
History     CSN: [id number]  Arrival date and time: 05/03/17 1635   First Provider Initiated Contact with Patient 05/03/17 1715      Chief Complaint  Patient presents with  . Vaginal Bleeding  . Abdominal Pain   HPI  Ms.  Mindy Wade is a 37 y.o. year old [id number] female at [id number] weeks gestation who presents to MAU reporting bright, red VB "like a heavy period" since last night and abdominal cramping. This is an unplanned, but desired pregnancy. She recently had an IUD removed and started pills ("but missed some"). She reports having SI a day ago.  Past Medical History:  Diagnosis Date  . Breast feeding status of mother   . Hx of blood clots   . Hx of varicella   . Hyperlipemia   . Hypertension   . Pregnancy induced hypertension     Past Surgical History:  Procedure Laterality Date  . NO PAST SURGERIES      Family History  Problem Relation Age of Onset  . Asthma Mother   . Heart disease Mother   . Hypertension Mother   . Stroke Mother   . Cancer Father        prostate  . Alcohol abuse Neg Hx   . Arthritis Neg Hx   . Birth defects Neg Hx   . COPD Neg Hx   . Depression Neg Hx   . Diabetes Neg Hx   . Drug abuse Neg Hx   . Early death Neg Hx   . Hearing loss Neg Hx   . Hyperlipidemia Neg Hx   . Kidney disease Neg Hx   . Learning disabilities Neg Hx   . Mental illness Neg Hx   . Mental retardation Neg Hx   . Miscarriages / Stillbirths Neg Hx   . Vision loss Neg Hx   . Varicose Veins Neg Hx     Social History   Tobacco Use  . Smoking status: Never Smoker  . Smokeless tobacco: Never Used  Substance Use Topics  . Alcohol use: No  . Drug use: No    Allergies:  Allergies  Allergen Reactions  . Penicillins Anaphylaxis    Has patient had a PCN reaction causing immediate rash, facial/tongue/throat swelling, SOB or lightheadedness with hypotension: Yes Has patient had a PCN reaction causing severe rash involving mucus membranes or skin necrosis: No Has  patient had a PCN reaction that required hospitalization No Has patient had a PCN reaction occurring within the last 10 years: No If all of the above answers are "NO", then may proceed with Cephalosporin use.     Medications Prior to Admission  Medication Sig Dispense Refill Last Dose  . benzonatate (TESSALON) 100 MG capsule Take 1 capsule (100 mg total) by mouth 3 (three) times daily as needed for cough. (Patient not taking: Reported on 05/02/2015) 21 capsule 0 Not Taking at Unknown time  . ferrous sulfate 325 (65 FE) MG tablet Take 325 mg by mouth 2 (two) times daily with a meal.   05/02/2015 at Unknown time  . labetalol (NORMODYNE) 200 MG tablet Take 200 mg by mouth 2 (two) times daily. Reported on 05/02/2015   05/02/2015 at 1200  . magic mouthwash w/lidocaine SOLN Take 5 mLs by mouth 4 (four) times daily as needed for mouth pain (gargle and swallow). (Patient not taking: Reported on 05/02/2015) 120 mL 0 Not Taking at Unknown time  . oxyCODONE-acetaminophen (PERCOCET/ROXICET) 5-325 MG tablet Take 1 tablet by mouth every  4 (four) hours as needed (for pain scale 4-7). (Patient not taking: Reported on 05/02/2015) 30 tablet 0 Not Taking at Unknown time    Review of Systems  Constitutional: Negative.   HENT: Negative.   Eyes: Negative.   Respiratory: Negative.   Cardiovascular: Negative.   Endocrine: Negative.   Genitourinary: Positive for pelvic pain (cramping).  Allergic/Immunologic: Negative.   Neurological: Negative.   Hematological: Negative.    Physical Exam   Blood pressure 103/64, pulse 79, temperature 98.5 F (36.9 C), temperature source Oral, resp. rate 20, height 5\' 8"  (1.727 m), weight 279 lb 4 oz (126.7 kg), last menstrual period 03/17/2017, SpO2 97 %.  Physical Exam  MAU Course  Procedures  MDM CCUA UPT CBC ABO/Rh HCG OB <14 wks U/S OB < 14 wks TVUS  *Consult with Dr. Mindi Slicker @ [phone] - notified of patient's complaints, assessments, lab & U/S results, tx plan Cytotec  protocol for failed pregnancy - ok to d/c home, agrees with plan  Results for orders placed or performed during the hospital encounter of 05/03/17 (from the past 24 hour(s))  Urinalysis, Routine w reflex microscopic     Status: Abnormal   Collection Time: 05/03/17  4:48 PM  Result Value Ref Range   Color, Urine YELLOW YELLOW   APPearance HAZY (A) CLEAR   Specific Gravity, Urine 1.018 1.005 - 1.030   pH 6.0 5.0 - 8.0   Glucose, UA NEGATIVE NEGATIVE mg/dL   Hgb urine dipstick LARGE (A) NEGATIVE   Bilirubin Urine NEGATIVE NEGATIVE   Ketones, ur NEGATIVE NEGATIVE mg/dL   Protein, ur NEGATIVE NEGATIVE mg/dL   Nitrite NEGATIVE NEGATIVE   Leukocytes, UA NEGATIVE NEGATIVE   RBC / HPF 0-5 0 - 5 RBC/hpf   WBC, UA 0-5 0 - 5 WBC/hpf   Bacteria, UA RARE (A) NONE SEEN   Squamous Epithelial / LPF 6-30 (A) NONE SEEN   Mucus PRESENT   Pregnancy, urine POC     Status: Abnormal   Collection Time: 05/03/17  4:56 PM  Result Value Ref Range   Preg Test, Ur POSITIVE (A) NEGATIVE  Wet prep, genital     Status: Abnormal   Collection Time: 05/03/17  5:20 PM  Result Value Ref Range   Yeast Wet Prep HPF POC NONE SEEN NONE SEEN   Trich, Wet Prep NONE SEEN NONE SEEN   Clue Cells Wet Prep HPF POC NONE SEEN NONE SEEN   WBC, Wet Prep HPF POC FEW (A) NONE SEEN   Sperm NONE SEEN   CBC     Status: Abnormal   Collection Time: 05/03/17  5:37 PM  Result Value Ref Range   WBC 7.1 4.0 - 10.5 K/uL   RBC 3.91 3.87 - 5.11 MIL/uL   Hemoglobin 11.6 (L) 12.0 - 15.0 g/dL   HCT 96.0 (L) 45.4 - 09.8 %   MCV 85.9 78.0 - 100.0 fL   MCH 29.7 26.0 - 34.0 pg   MCHC 34.5 30.0 - 36.0 g/dL   RDW 11.9 14.7 - 82.9 %   Platelets 302 150 - 400 K/uL  hCG, quantitative, pregnancy     Status: Abnormal   Collection Time: 05/03/17  5:37 PM  Result Value Ref Range   hCG, Beta Chain, Quant, S 39,178 (H) <5 mIU/mL    US Ob Comp Less 14 Wks  Result Date: 05/03/2017 CLINICAL DATA:  Vaginal bleeding and pregnancy EXAM: OBSTETRIC <14 WK  [place] AND TRANSVAGINAL OB US TECHNIQUE: Both transabdominal and transvaginal ultrasound examinations were performed  for complete evaluation of the gestation as well as the maternal uterus, adnexal regions, and pelvic cul-de-sac. Transvaginal technique was performed to assess early pregnancy. COMPARISON:  None. FINDINGS: Intrauterine gestational sac: Single intrauterine gestational sac Yolk sac:  Visible Embryo:  Visible Cardiac Activity: Not visible CRL:  27 mm   9 w   4 d                  [place] EDC: 12/02/2017 Subchorionic hemorrhage:  None visualized. Maternal uterus/adnexae: Right ovary is non visualized. The left ovary measures 2.3 x 3.4 x 1.9 cm. Trace fluid in the left adnexa. IMPRESSION: 1. Single intrauterine gestation with visible embryo but no cardiac activity. Crown-rump length is 27 mm. Findings meet definitive criteria for failed pregnancy. This follows SRU consensus guidelines: Diagnostic Criteria for Nonviable Pregnancy Early in the First Trimester. [employer] Mis J Med [phone]. 2. Trace fluid in the left adnexa Electronically Signed   By: Jasmine Pang M.D.   On: 05/03/2017 18:35   US Ob Transvaginal  Result Date: 05/03/2017 CLINICAL DATA:  Vaginal bleeding and pregnancy EXAM: OBSTETRIC <14 WK [place] AND TRANSVAGINAL OB US TECHNIQUE: Both transabdominal and transvaginal ultrasound examinations were performed for complete evaluation of the gestation as well as the maternal uterus, adnexal regions, and pelvic cul-de-sac. Transvaginal technique was performed to assess early pregnancy. COMPARISON:  None. FINDINGS: Intrauterine gestational sac: Single intrauterine gestational sac Yolk sac:  Visible Embryo:  Visible Cardiac Activity: Not visible CRL:  27 mm   9 w   4 d                  [place] EDC: 12/02/2017 Subchorionic hemorrhage:  None visualized. Maternal uterus/adnexae: Right ovary is non visualized. The left ovary measures 2.3 x 3.4 x 1.9 cm. Trace fluid in the left adnexa. IMPRESSION: 1. Single intrauterine  gestation with visible embryo but no cardiac activity. Crown-rump length is 27 mm. Findings meet definitive criteria for failed pregnancy. This follows SRU consensus guidelines: Diagnostic Criteria for Nonviable Pregnancy Early in the First Trimester. [employer] Mis J Med [phone]. 2. Trace fluid in the left adnexa Electronically Signed   By: Jasmine Pang M.D.   On: 05/03/2017 18:35    Assessment and Plan  Missed abortion with fetal demise before 20 completed weeks of gestation  - Early Intrauterine Pregnancy Failure Protocol X  Documented intrauterine pregnancy failure less than or equal to [redacted] weeks   gestation  X  No serious current illness  X  Baseline Hgb greater than or equal to 10 g/dl  X  Patient has easily accessible transportation to the hospital  X  Clear preference  X  Practitioner/physician deems patient reliable  X  Counseling by practitioner or physician  X  Patient education by RN  X  Consent form signed  N/A  Rho-Gam given by RN if indicated  X  Medication dispensed  X  Cytotec 800 mcg Intravaginally by patient at home - prescribed      Intravaginally by NP in MAU       Rectally by patient at home       Rectally by RN in MAU  X   Ibuprofen 800 mg 1 tablet by mouth every 6 hours as needed #30 - prescribed  X   Tylenol #3 mg by mouth every 4 to 6 hours as needed - prescribed  X   Phenergan 12.5 mg by mouth every 4 hours as needed for nausea - prescribed  Reviewed with  pt cytotec procedure.  Pt verbalizes that she lives close to the hospital and has transportation readily available.  Pt appears reliable and verbalizes understanding and agrees with plan of care.  - Discharge patient - Return to MAU for increased bleeding (soaking a 1-2 pads every hour) - Comfort materials & Heartstrings given - Patient verbalized an understanding of the plan of care and agrees.    Raelyn Mora, MSN, CNM 05/03/2017, 5:36 PM

## 2017-05-03 NOTE — MAU Note (Addendum)
Pt presents with c/o VB and abdominal pain that began last night, not currently bleeding.  Reports bleeding was bright red, didn't pass any clots or tissue.  Pt reports she thinks she's miscarried. States had a +UPT approx 1 week ago. LMP 03/17/17

## 2017-05-04 LAB — HIV ANTIBODY (ROUTINE TESTING W REFLEX): HIV Screen 4th Generation wRfx: NONREACTIVE

## 2017-05-06 NOTE — MAU Note (Signed)
Call from [facility], GC/Chlamydia swab was labeled with this pt's name but requisition was under a different name so the specimen was discarded

## 2017-05-24 ENCOUNTER — Inpatient Hospital Stay (HOSPITAL_COMMUNITY)
Admission: AD | Admit: 2017-05-24 | Discharge: 2017-05-25 | Disposition: A | Discharge status: Home / Self Care | Payer: 59 | Source: Ambulatory Visit | Attending: Obstetrics and Gynecology | Admitting: Obstetrics and Gynecology

## 2017-05-24 ENCOUNTER — Inpatient Hospital Stay (HOSPITAL_COMMUNITY): Payer: 59

## 2017-05-24 ENCOUNTER — Other Ambulatory Visit: Payer: Self-pay

## 2017-05-24 ENCOUNTER — Encounter (HOSPITAL_COMMUNITY): Payer: Self-pay

## 2017-05-24 DIAGNOSIS — O021 Missed abortion: Secondary | ICD-10-CM | POA: Insufficient documentation

## 2017-05-24 DIAGNOSIS — Z86718 Personal history of other venous thrombosis and embolism: Secondary | ICD-10-CM | POA: Diagnosis not present

## 2017-05-24 DIAGNOSIS — N939 Abnormal uterine and vaginal bleeding, unspecified: Secondary | ICD-10-CM

## 2017-05-24 DIAGNOSIS — Z880 Allergy status to penicillin: Secondary | ICD-10-CM | POA: Diagnosis not present

## 2017-05-24 DIAGNOSIS — O039 Complete or unspecified spontaneous abortion without complication: Secondary | ICD-10-CM | POA: Diagnosis not present

## 2017-05-24 LAB — CBC
HCT: 31.6 % — ABNORMAL LOW (ref 36.0–46.0)
Hemoglobin: 10.5 g/dL — ABNORMAL LOW (ref 12.0–15.0)
MCH: 29.5 pg (ref 26.0–34.0)
MCHC: 33.2 g/dL (ref 30.0–36.0)
MCV: 88.8 fL (ref 78.0–100.0)
PLATELETS: 268 10*3/uL (ref 150–400)
RBC: 3.56 MIL/uL — ABNORMAL LOW (ref 3.87–5.11)
RDW: 15.6 % — ABNORMAL HIGH (ref 11.5–15.5)
WBC: 7.9 10*3/uL (ref 4.0–10.5)

## 2017-05-24 LAB — URINALYSIS, ROUTINE W REFLEX MICROSCOPIC
BACTERIA UA: NONE SEEN
BILIRUBIN URINE: NEGATIVE
Glucose, UA: 50 mg/dL — AB
KETONES UR: 5 mg/dL — AB
LEUKOCYTES UA: NEGATIVE
NITRITE: NEGATIVE
PROTEIN: 100 mg/dL — AB
SPECIFIC GRAVITY, URINE: 1.03 (ref 1.005–1.030)
SQUAMOUS EPITHELIAL / LPF: NONE SEEN
pH: 6 (ref 5.0–8.0)

## 2017-05-24 LAB — HCG, QUANTITATIVE, PREGNANCY: hCG, Beta Chain, Quant, S: 570 m[IU]/mL — ABNORMAL HIGH (ref <5)

## 2017-05-24 MED ORDER — IBUPROFEN 800 MG PO TABS: 800.0000 mg | ORAL_TABLET | Freq: Four times a day (QID) | ORAL | 0 refills | Status: DC | PRN

## 2017-05-24 MED ORDER — MISOPROSTOL 200 MCG PO TABS: 800.0000 ug | ORAL_TABLET | Freq: Once | ORAL | 0 refills | Status: DC

## 2017-05-24 MED ORDER — FERROUS SULFATE 325 (65 FE) MG PO TABS: 325.0000 mg | ORAL_TABLET | Freq: Every day | ORAL | 0 refills | Status: DC

## 2017-05-24 NOTE — MAU Note (Signed)
Pt presents to MAU with c/o vaginal bleeding that started x 2 days ago and abdominal cramping that started today. Pt recently had miscarriage on 05/10/2017.

## 2017-05-24 NOTE — MAU Provider Note (Signed)
History     CSN: [id number]  Arrival date and time: 05/24/17 2138   First Provider Initiated Contact with Patient 05/24/17 2229     Chief Complaint  Patient presents with  . Vaginal Bleeding   HPI Mindy Wade is a 37 y.o. [id number] who presents with vaginal bleeding. She was diagnosed with a missed AB on 3/8 and took cytotec on 3/15. She states she had 3 days of heavy bleeding that stopped. She states on 3/27 she started bleeding again that she thought was her period but today the bleeding became heavier. She states she soaked 3 pads in less than 1 hour and is passing large clots. She is also having lower abdominal cramping that she rates a 6/10 and tried ibuprofen for with no relief.   OB History    Gravida  8   Para  7   Term  6   Preterm  1   AB  1   Living  6     SAB  1   TAB      Ectopic      Multiple  0   Live Births  7           Past Medical History:  Diagnosis Date  . Breast feeding status of mother   . Hx of blood clots   . Hx of varicella   . Hyperlipemia   . Hypertension   . Pregnancy induced hypertension     Past Surgical History:  Procedure Laterality Date  . NO PAST SURGERIES      Family History  Problem Relation Age of Onset  . Asthma Mother   . Heart disease Mother   . Hypertension Mother   . Stroke Mother   . Cancer Father        prostate  . Alcohol abuse Neg Hx   . Arthritis Neg Hx   . Birth defects Neg Hx   . COPD Neg Hx   . Depression Neg Hx   . Diabetes Neg Hx   . Drug abuse Neg Hx   . Early death Neg Hx   . Hearing loss Neg Hx   . Hyperlipidemia Neg Hx   . Kidney disease Neg Hx   . Learning disabilities Neg Hx   . Mental illness Neg Hx   . Mental retardation Neg Hx   . Miscarriages / Stillbirths Neg Hx   . Vision loss Neg Hx   . Varicose Veins Neg Hx     Social History   Tobacco Use  . Smoking status: Never Smoker  . Smokeless tobacco: Never Used  Substance Use Topics  . Alcohol use: No  . Drug use:  No    Allergies:  Allergies  Allergen Reactions  . Penicillins Anaphylaxis    Has patient had a PCN reaction causing immediate rash, facial/tongue/throat swelling, SOB or lightheadedness with hypotension: Yes Has patient had a PCN reaction causing severe rash involving mucus membranes or skin necrosis: No Has patient had a PCN reaction that required hospitalization No Has patient had a PCN reaction occurring within the last 10 years: No If all of the above answers are "NO", then may proceed with Cephalosporin use.     Medications Prior to Admission  Medication Sig Dispense Refill Last Dose  . acetaminophen-codeine (TYLENOL #3) 300-30 MG tablet Take 1-2 tablets by mouth every 6 (six) hours as needed for moderate pain. 15 tablet 0   . amLODipine (NORVASC) 10 MG tablet Take 10 mg by mouth  daily.   05/03/2017 at Unknown time  . ibuprofen (ADVIL,MOTRIN) 800 MG tablet Take 1 tablet (800 mg total) by mouth every 8 (eight) hours as needed. 30 tablet 0   . labetalol (NORMODYNE) 200 MG tablet Take 200 mg by mouth 2 (two) times daily. Reported on 05/02/2015   05/03/2017 at 1000  . misoprostol (CYTOTEC) 200 MCG tablet Place 4 tablets (800 mcg total) vaginally once for 1 dose. 4 tablet 0   . NORLYDA 0.35 MG tablet TK 1 T PO QD UTD  11 04/22/2017  . promethazine (PHENERGAN) 12.5 MG tablet Take 1 tablet (12.5 mg total) by mouth every 4 (four) hours as needed for nausea or vomiting. 30 tablet 0   . rosuvastatin (CRESTOR) 5 MG tablet Take 5 mg by mouth daily.   05/02/2017 at Unknown time  . spironolactone (ALDACTONE) 25 MG tablet Take 25 mg by mouth daily.   05/03/2017 at Unknown time    Review of Systems  Constitutional: Negative.  Negative for fatigue and fever.  HENT: Negative.   Respiratory: Negative.  Negative for shortness of breath.   Cardiovascular: Negative.  Negative for chest pain.  Gastrointestinal: Positive for abdominal pain. Negative for constipation, diarrhea, nausea and vomiting.   Genitourinary: Positive for vaginal bleeding. Negative for dysuria.  Neurological: Negative.  Negative for dizziness and headaches.   Physical Exam   Blood pressure 116/86, pulse 85, temperature 98.3 F (36.8 C), temperature source Oral, resp. rate 18, height 5\' 8"  (1.727 m), weight 286 lb (129.7 kg), unknown if currently breastfeeding.  Physical Exam  Nursing note and vitals reviewed. Constitutional: She is oriented to person, place, and time. She appears well-developed and well-nourished. No distress.  HENT:  Head: Normocephalic.  Eyes: Pupils are equal, round, and reactive to light.  Cardiovascular: Normal rate, regular rhythm and normal heart sounds.  Respiratory: Effort normal and breath sounds normal. No respiratory distress.  GI: Soft. Bowel sounds are normal. She exhibits no distension. There is no tenderness.  Genitourinary:  Genitourinary Comments: Copious amount of bright red bleeding in vault. Grapefruit sized clot removed from cervix and vagina.   Neurological: She is alert and oriented to person, place, and time.  Skin: Skin is warm and dry.  Psychiatric: She has a normal mood and affect. Her behavior is normal. Judgment and thought content normal.    MAU Course  Procedures Results for orders placed or performed during the hospital encounter of 05/24/17 (from the past 24 hour(s))  Urinalysis, Routine w reflex microscopic     Status: Abnormal   Collection Time: 05/24/17  9:58 PM  Result Value Ref Range   Color, Urine AMBER (A) YELLOW   APPearance CLOUDY (A) CLEAR   Specific Gravity, Urine 1.030 1.005 - 1.030   pH 6.0 5.0 - 8.0   Glucose, UA 50 (A) NEGATIVE mg/dL   Hgb urine dipstick LARGE (A) NEGATIVE   Bilirubin Urine NEGATIVE NEGATIVE   Ketones, ur 5 (A) NEGATIVE mg/dL   Protein, ur 161 (A) NEGATIVE mg/dL   Nitrite NEGATIVE NEGATIVE   Leukocytes, UA NEGATIVE NEGATIVE   RBC / HPF TOO NUMEROUS TO COUNT 0 - 5 RBC/hpf   WBC, UA TOO NUMEROUS TO COUNT 0 - 5  WBC/hpf   Bacteria, UA NONE SEEN NONE SEEN   Squamous Epithelial / LPF NONE SEEN NONE SEEN   Mucus PRESENT   CBC     Status: Abnormal   Collection Time: 05/24/17 10:34 PM  Result Value Ref Range   WBC 7.9  4.0 - 10.5 K/uL   RBC 3.56 (L) 3.87 - 5.11 MIL/uL   Hemoglobin 10.5 (L) 12.0 - 15.0 g/dL   HCT 40.9 (L) 81.1 - 91.4 %   MCV 88.8 78.0 - 100.0 fL   MCH 29.5 26.0 - 34.0 pg   MCHC 33.2 30.0 - 36.0 g/dL   RDW 78.2 (H) 95.6 - 21.3 %   Platelets 268 150 - 400 K/uL  hCG, quantitative, pregnancy     Status: Abnormal   Collection Time: 05/24/17 10:34 PM  Result Value Ref Range   hCG, Beta Chain, Quant, S 570 (H) <5 mIU/mL   US Ob Transvaginal  Result Date: 05/24/2017 CLINICAL DATA:  36 year old female with vaginal bleeding. Concern for miscarriage. EXAM: TRANSVAGINAL OB ULTRASOUND TECHNIQUE: Transvaginal ultrasound was performed for complete evaluation of the gestation as well as the maternal uterus, adnexal regions, and pelvic cul-de-sac. COMPARISON:  Ultrasound dated 05/03/2017 FINDINGS: The uterus is anteverted. The endometrium is heterogeneous and thickened with echogenic content measuring up to 2.6 cm in thickness. The previously seen intrauterine pregnancy is no longer visualized on the current study consistent with miscarriage. Echogenic content within the endometrium may represent blood products/clot. Retained products of conception is not entirely excluded. Correlation with clinical exam and serial HCG levels recommended. The ovaries appear unremarkable. There is a corpus luteum in the left ovary. There is trace free fluid within the pelvis. IMPRESSION: 1. Interval miscarriage of the previously seen IUP. Echogenic content within the endometrium, likely blood products/clot. Retained products of conception is not entirely excluded. Clinical correlation is recommended. 2. Unremarkable ovaries. Electronically Signed   By: Elgie Collard M.D.   On: 05/24/2017 23:39     MDM UA CBC, HCG US  Pelvis complete with transvaginal  Consulted with Dr. Ellyn Hack- will give patient rx for cytotec and ibuprofen and have patient follow up in the office next week for repeat blood work.  Assessment and Plan   1. Complete miscarriage   2. Vaginal bleeding    -Discharge home in stable condition -Rx for cytotec, ibuprofen and ferrous sulfate given to patient -Vaginal bleeding precautions discussed -Patient advised to follow-up with [facility] next week for repeat blood work -Patient may return to MAU as needed or if her condition were to change or worsen  Rolm Bookbinder CNM 05/24/2017, 11:07 PM   Allergies as of 05/24/2017      Reactions   Penicillins Anaphylaxis   Has patient had a PCN reaction causing immediate rash, facial/tongue/throat swelling, SOB or lightheadedness with hypotension: Yes Has patient had a PCN reaction causing severe rash involving mucus membranes or skin necrosis: No Has patient had a PCN reaction that required hospitalization No Has patient had a PCN reaction occurring within the last 10 years: No If all of the above answers are "NO", then may proceed with Cephalosporin use.      Medication List    TAKE these medications   acetaminophen-codeine 300-30 MG tablet Commonly known as:  TYLENOL #3 Take 1-2 tablets by mouth every 6 (six) hours as needed for moderate pain.   amLODipine 10 MG tablet Commonly known as:  NORVASC Take 10 mg by mouth daily.   ferrous sulfate 325 (65 FE) MG tablet Take 1 tablet (325 mg total) by mouth daily.   ibuprofen 800 MG tablet Commonly known as:  ADVIL,MOTRIN Take 1 tablet (800 mg total) by mouth every 6 (six) hours as needed. What changed:  when to take this   labetalol 200 MG tablet  Commonly known as:  NORMODYNE Take 200 mg by mouth 2 (two) times daily. Reported on 05/02/2015   misoprostol 200 MCG tablet Commonly known as:  CYTOTEC Place 4 tablets (800 mcg total) vaginally once for 1 dose. What changed:  Another  medication with the same name was added. Make sure you understand how and when to take each.   misoprostol 200 MCG tablet Commonly known as:  CYTOTEC Take 4 tablets (800 mcg total) by mouth once for 1 dose. Start taking on:  05/25/2017 What changed:  You were already taking a medication with the same name, and this prescription was added. Make sure you understand how and when to take each.   NORLYDA 0.35 MG tablet Generic drug:  norethindrone TK 1 T PO QD UTD   promethazine 12.5 MG tablet Commonly known as:  PHENERGAN Take 1 tablet (12.5 mg total) by mouth every 4 (four) hours as needed for nausea or vomiting.   rosuvastatin 5 MG tablet Commonly known as:  CRESTOR Take 5 mg by mouth daily.   spironolactone 25 MG tablet Commonly known as:  ALDACTONE Take 25 mg by mouth daily.

## 2017-06-15 ENCOUNTER — Emergency Department (HOSPITAL_COMMUNITY)
Admission: EM | Admit: 2017-06-15 | Discharge: 2017-06-15 | Disposition: A | Discharge status: Home / Self Care | Payer: 59 | Attending: Emergency Medicine | Admitting: Emergency Medicine

## 2017-06-15 ENCOUNTER — Encounter (HOSPITAL_COMMUNITY): Payer: Self-pay

## 2017-06-15 DIAGNOSIS — Z9049 Acquired absence of other specified parts of digestive tract: Secondary | ICD-10-CM | POA: Diagnosis not present

## 2017-06-15 DIAGNOSIS — F12921 Cannabis use, unspecified with intoxication delirium: Secondary | ICD-10-CM

## 2017-06-15 DIAGNOSIS — I10 Essential (primary) hypertension: Secondary | ICD-10-CM | POA: Diagnosis not present

## 2017-06-15 DIAGNOSIS — F12121 Cannabis abuse with intoxication delirium: Secondary | ICD-10-CM | POA: Insufficient documentation

## 2017-06-15 DIAGNOSIS — R55 Syncope and collapse: Secondary | ICD-10-CM | POA: Insufficient documentation

## 2017-06-15 DIAGNOSIS — Z79899 Other long term (current) drug therapy: Secondary | ICD-10-CM | POA: Insufficient documentation

## 2017-06-15 LAB — BASIC METABOLIC PANEL
ANION GAP: 12 (ref 5–15)
BUN: 12 mg/dL (ref 6–20)
CALCIUM: 8.6 mg/dL — AB (ref 8.9–10.3)
CO2: 20 mmol/L — AB (ref 22–32)
Chloride: 104 mmol/L (ref 101–111)
Creatinine, Ser: 0.68 mg/dL (ref 0.44–1.00)
GFR calc Af Amer: >60 mL/min (ref >60)
GFR calc non Af Amer: >60 mL/min (ref >60)
GLUCOSE: 181 mg/dL — AB (ref 65–99)
Potassium: 3.5 mmol/L (ref 3.5–5.1)
Sodium: 136 mmol/L (ref 135–145)

## 2017-06-15 LAB — I-STAT BETA HCG BLOOD, ED (MC, WL, AP ONLY): I-stat hCG, quantitative: <5 m[IU]/mL (ref <5)

## 2017-06-15 LAB — CBG MONITORING, ED: Glucose-Capillary: 169 mg/dL — ABNORMAL HIGH (ref 65–99)

## 2017-06-15 LAB — CBC
HCT: 36.1 % (ref 36.0–46.0)
HEMOGLOBIN: 11.7 g/dL — AB (ref 12.0–15.0)
MCH: 29.5 pg (ref 26.0–34.0)
MCHC: 32.4 g/dL (ref 30.0–36.0)
MCV: 90.9 fL (ref 78.0–100.0)
Platelets: 302 10*3/uL (ref 150–400)
RBC: 3.97 MIL/uL (ref 3.87–5.11)
RDW: 15.1 % (ref 11.5–15.5)
WBC: 6.9 10*3/uL (ref 4.0–10.5)

## 2017-06-15 NOTE — ED Triage Notes (Addendum)
Patient BIB [facility] for multiple syncopal episodes. Patient took 2 edible THC gummies around midnight and then started "to feel funny". Called EMS, who states patient was initially tachy at 120 then started to have multiple syncopal episodes and became lathargic; NPA removed once at facility.

## 2017-06-15 NOTE — ED Provider Notes (Signed)
[facility] EMERGENCY DEPARTMENT Provider Note   CSN: [id number] Arrival date & time: 06/15/17  0406     History   Chief Complaint Chief Complaint  Patient presents with  . Near Syncope    HPI Mindy Wade is a 37 y.o. female.  HPI Patient is a 37 year old female that admits to eating edible cannabinoids tonight.  She became anxious and had a syncopal episode x2 for EMS.  She states it made her feel extremely funny.  Patient was initially tachycardic for EMS.  No complaints of palpitations.  At time of arrival to emergency department she is feeling much better.  She is without complaints at this time.  No chest pain.  No palpitations.  Denies abdominal pain.  No nausea vomiting or diarrhea.  No headache or head injury.  This is the first time she is ever tried recreational edible cannabinoids.  She states she will never do this again..   Past Medical History:  Diagnosis Date  . Breast feeding status of mother   . Hx of blood clots   . Hx of varicella   . Hyperlipemia   . Hypertension   . Pregnancy induced hypertension     Patient Active Problem List   Diagnosis Date Noted  . Missed abortion with fetal demise before 20 completed weeks of gestation 05/03/2017  . Precipitous delivery 02/19/2015  . Postpartum care following vaginal delivery 02/19/2015  . Active labor at term 02/18/2015  . Pre-diabetes 05/14/2014  . Elevated LDL cholesterol level 05/14/2014  . Pulmonary embolism (HCC) 10/17/2013  . Labor and delivery indication for care or intervention 10/09/2013  . Spontaneous abortion in second trimester 10/09/2013  . SVD (spontaneous vaginal delivery) 03/02/2013    Past Surgical History:  Procedure Laterality Date  . CHOLECYSTECTOMY       OB History    Gravida  8   Para  7   Term  6   Preterm  1   AB  1   Living  6     SAB  1   TAB      Ectopic      Multiple  0   Live Births  7            Home Medications    Prior  to Admission medications   Medication Sig Start Date End Date Taking? Authorizing Provider  amLODipine (NORVASC) 10 MG tablet Take 10 mg by mouth daily.   Yes [provider]  labetalol (NORMODYNE) 200 MG tablet Take 200 mg by mouth 2 (two) times daily. Reported on 05/02/2015   Yes [provider]  NORLYDA 0.35 MG tablet TAKE 1 TABLET BY MOUTH DAILY 02/08/17  Yes [provider]  rosuvastatin (CRESTOR) 5 MG tablet Take 5 mg by mouth daily.   Yes [provider]  spironolactone (ALDACTONE) 25 MG tablet Take 25 mg by mouth daily.   Yes [provider]  acetaminophen-codeine (TYLENOL #3) 300-30 MG tablet Take 1-2 tablets by mouth every 6 (six) hours as needed for moderate pain. Patient not taking: Reported on 06/15/2017 05/03/17   Raelyn Mora, CNM  ferrous sulfate 325 (65 FE) MG tablet Take 1 tablet (325 mg total) by mouth daily. Patient not taking: Reported on 06/15/2017 05/24/17   Rolm Bookbinder, CNM  ibuprofen (ADVIL,MOTRIN) 800 MG tablet Take 1 tablet (800 mg total) by mouth every 6 (six) hours as needed. Patient not taking: Reported on 06/15/2017 05/24/17   Rolm Bookbinder, CNM  misoprostol (CYTOTEC) 200 MCG tablet Place 4 tablets (800 mcg total) vaginally once for 1 dose. Patient not taking: Reported on 06/15/2017 05/03/17 06/15/25  Raelyn Mora, CNM  misoprostol (CYTOTEC) 200 MCG tablet Take 4 tablets (800 mcg total) by mouth once for 1 dose. Patient not taking: Reported on 06/15/2017 05/25/17 06/15/25  Rolm Bookbinder, CNM  promethazine (PHENERGAN) 12.5 MG tablet Take 1 tablet (12.5 mg total) by mouth every 4 (four) hours as needed for nausea or vomiting. Patient not taking: Reported on 06/15/2017 05/03/17   Raelyn Mora, CNM    Family History Family History  Problem Relation Age of Onset  . Asthma Mother   . Heart disease Mother   . Hypertension Mother   . Stroke Mother   . Cancer Father        prostate  . Alcohol abuse Neg Hx   .  Arthritis Neg Hx   . Birth defects Neg Hx   . COPD Neg Hx   . Depression Neg Hx   . Diabetes Neg Hx   . Drug abuse Neg Hx   . Early death Neg Hx   . Hearing loss Neg Hx   . Hyperlipidemia Neg Hx   . Kidney disease Neg Hx   . Learning disabilities Neg Hx   . Mental illness Neg Hx   . Mental retardation Neg Hx   . Miscarriages / Stillbirths Neg Hx   . Vision loss Neg Hx   . Varicose Veins Neg Hx     Social History Social History   Tobacco Use  . Smoking status: Never Smoker  . Smokeless tobacco: Never Used  Substance Use Topics  . Alcohol use: No  . Drug use: No     Allergies   Penicillins   Review of Systems Review of Systems  All other systems reviewed and are negative.    Physical Exam Updated Vital Signs BP (!) 144/95   Pulse (!) 102   Temp 98.3 F (36.8 C) (Oral)   Resp (!) 22   Ht 5\' 8"  (1.727 m)   Wt 127 kg (280 lb)   LMP  (LMP Unknown)   SpO2 97%   BMI 42.57 kg/m   Physical Exam  Constitutional: She is oriented to person, place, and time. She appears well-developed and well-nourished. No distress.  HENT:  Head: Normocephalic and atraumatic.  Eyes: EOM are normal.  Neck: Normal range of motion.  Cardiovascular: Normal rate, regular rhythm and normal heart sounds.  Pulmonary/Chest: Effort normal and breath sounds normal.  Abdominal: Soft. She exhibits no distension. There is no tenderness.  Musculoskeletal: Normal range of motion.  Neurological: She is alert and oriented to person, place, and time.  Skin: Skin is warm and dry.  Psychiatric: She has a normal mood and affect. Judgment normal.  Nursing note and vitals reviewed.    ED Treatments / Results  Labs (all labs ordered are listed, but only abnormal results are displayed) Labs Reviewed  BASIC METABOLIC PANEL - Abnormal; Notable for the following components:      Result Value   CO2 20 (*)    Glucose, Bld 181 (*)    Calcium 8.6 (*)    All other components within normal limits    CBC - Abnormal; Notable for the following components:   Hemoglobin 11.7 (*)    All other components within normal limits  CBG MONITORING, ED - Abnormal; Notable for the following components:   Glucose-Capillary 169 (*)    All other components  within normal limits  URINALYSIS, ROUTINE W REFLEX MICROSCOPIC  I-STAT BETA HCG BLOOD, ED ([facility], [facility], [facility] ONLY)    EKG EKG Interpretation  Date/Time:  Saturday June 15 2017 04:15:53 EDT Ventricular Rate:  108 PR Interval:    QRS Duration: 95 QT Interval:  338 QTC Calculation: 453 R Axis:   55 Text Interpretation:  Sinus tachycardia tachycardia otherwise no significant change Confirmed by Azalia Bilisampos, Idris Edmundson ([id number]) on 06/16/2017 3:52:35 AM   Radiology No results found.  Procedures Procedures (including critical care time)  Medications Ordered in ED Medications - No data to display   Initial Impression / Assessment and Plan / ED Course  I have reviewed the triage vital signs and the nursing notes.  Pertinent labs & imaging results that were available during my care of the patient were reviewed by me and considered in my medical decision making (see chart for details).     At time of my evaluation the patient is completely asymptomatic.  She feels much better at this time.  I do not think she needs additional workup in the hospital.  Primary care follow-up.  She understands return to the ER for new or worsening symptoms  Labs personally reviewed: Hemoglobin 11.7  Imaging: No imaging to review  Disposition: Home  Final Clinical Impressions(s) / ED Diagnoses   Final diagnoses:  Syncope, unspecified syncope type  Cannabis use with intoxication, with delirium [facility](HCC)    ED Discharge Orders    None       Azalia Bilisampos, Leisel Pinette, MD 06/16/17 [phone]

## 2017-06-16 ENCOUNTER — Encounter (HOSPITAL_COMMUNITY): Payer: Self-pay | Admitting: Emergency Medicine

## 2017-06-16 ENCOUNTER — Other Ambulatory Visit: Payer: Self-pay

## 2017-06-16 ENCOUNTER — Emergency Department (HOSPITAL_COMMUNITY): Payer: 59

## 2017-06-16 ENCOUNTER — Emergency Department (HOSPITAL_COMMUNITY)
Admission: EM | Admit: 2017-06-16 | Discharge: 2017-06-17 | Disposition: A | Discharge status: Home / Self Care | Payer: 59 | Attending: Emergency Medicine | Admitting: Emergency Medicine

## 2017-06-16 DIAGNOSIS — I10 Essential (primary) hypertension: Secondary | ICD-10-CM | POA: Diagnosis not present

## 2017-06-16 DIAGNOSIS — R519 Headache, unspecified: Secondary | ICD-10-CM

## 2017-06-16 DIAGNOSIS — R7989 Other specified abnormal findings of blood chemistry: Secondary | ICD-10-CM | POA: Diagnosis not present

## 2017-06-16 DIAGNOSIS — K449 Diaphragmatic hernia without obstruction or gangrene: Secondary | ICD-10-CM | POA: Diagnosis not present

## 2017-06-16 DIAGNOSIS — Z79899 Other long term (current) drug therapy: Secondary | ICD-10-CM | POA: Insufficient documentation

## 2017-06-16 DIAGNOSIS — R51 Headache: Secondary | ICD-10-CM | POA: Insufficient documentation

## 2017-06-16 DIAGNOSIS — R079 Chest pain, unspecified: Secondary | ICD-10-CM | POA: Diagnosis present

## 2017-06-16 LAB — BASIC METABOLIC PANEL
ANION GAP: 9 (ref 5–15)
BUN: 6 mg/dL (ref 6–20)
CO2: 22 mmol/L (ref 22–32)
Calcium: 8.9 mg/dL (ref 8.9–10.3)
Chloride: 105 mmol/L (ref 101–111)
Creatinine, Ser: 0.73 mg/dL (ref 0.44–1.00)
GFR calc Af Amer: >60 mL/min (ref >60)
GFR calc non Af Amer: >60 mL/min (ref >60)
GLUCOSE: 98 mg/dL (ref 65–99)
POTASSIUM: 3.8 mmol/L (ref 3.5–5.1)
Sodium: 136 mmol/L (ref 135–145)

## 2017-06-16 LAB — I-STAT BETA HCG BLOOD, ED (MC, WL, AP ONLY): I-stat hCG, quantitative: <5 m[IU]/mL (ref <5)

## 2017-06-16 LAB — I-STAT TROPONIN, ED: Troponin i, poc: 0 ng/mL (ref 0.00–0.08)

## 2017-06-16 LAB — CBC
HEMATOCRIT: 36.6 % (ref 36.0–46.0)
HEMOGLOBIN: 11.7 g/dL — AB (ref 12.0–15.0)
MCH: 29.6 pg (ref 26.0–34.0)
MCHC: 32 g/dL (ref 30.0–36.0)
MCV: 92.7 fL (ref 78.0–100.0)
Platelets: 324 10*3/uL (ref 150–400)
RBC: 3.95 MIL/uL (ref 3.87–5.11)
RDW: 14.9 % (ref 11.5–15.5)
WBC: 7.4 10*3/uL (ref 4.0–10.5)

## 2017-06-16 NOTE — ED Notes (Signed)
Delay in lab draw,  Pt in xray. 

## 2017-06-16 NOTE — ED Triage Notes (Signed)
Reports central chest pressure that radiates to left side of chest and down left arm that started today.  Reports it comes and goes.  No cardiac history.

## 2017-06-17 ENCOUNTER — Encounter (HOSPITAL_COMMUNITY): Payer: Self-pay | Admitting: Emergency Medicine

## 2017-06-17 ENCOUNTER — Other Ambulatory Visit: Payer: Self-pay

## 2017-06-17 ENCOUNTER — Emergency Department (HOSPITAL_COMMUNITY)
Admission: EM | Admit: 2017-06-17 | Discharge: 2017-06-18 | Disposition: A | Discharge status: Home / Self Care | Payer: 59 | Source: Home / Self Care

## 2017-06-17 ENCOUNTER — Emergency Department (HOSPITAL_COMMUNITY): Payer: 59

## 2017-06-17 DIAGNOSIS — Z5321 Procedure and treatment not carried out due to patient leaving prior to being seen by health care provider: Secondary | ICD-10-CM | POA: Insufficient documentation

## 2017-06-17 DIAGNOSIS — R51 Headache: Secondary | ICD-10-CM

## 2017-06-17 LAB — D-DIMER, QUANTITATIVE: D-Dimer, Quant: 0.77 ug/mL-FEU — ABNORMAL HIGH (ref 0.00–0.50)

## 2017-06-17 LAB — I-STAT TROPONIN, ED: Troponin i, poc: 0.01 ng/mL (ref 0.00–0.08)

## 2017-06-17 MED ORDER — IOPAMIDOL (ISOVUE-370) INJECTION 76%
100.0000 mL | Freq: Once | INTRAVENOUS | Status: AC | PRN
  Administered 2017-06-17: 100 mL via INTRAVENOUS

## 2017-06-17 MED ORDER — IOPAMIDOL (ISOVUE-370) INJECTION 76%
INTRAVENOUS | Status: AC
  Filled 2017-06-17: qty 100

## 2017-06-17 MED ORDER — KETOROLAC TROMETHAMINE 30 MG/ML IJ SOLN
30.0000 mg | Freq: Once | INTRAMUSCULAR | Status: AC
  Administered 2017-06-17: 30 mg via INTRAVENOUS
  Filled 2017-06-17: qty 1

## 2017-06-17 MED ORDER — METOCLOPRAMIDE HCL 5 MG/ML IJ SOLN
10.0000 mg | Freq: Once | INTRAMUSCULAR | Status: AC
  Administered 2017-06-17: 10 mg via INTRAVENOUS
  Filled 2017-06-17: qty 2

## 2017-06-17 MED ORDER — SODIUM CHLORIDE 0.9 % IV BOLUS
1000.0000 mL | Freq: Once | INTRAVENOUS | Status: AC
  Administered 2017-06-17: 1000 mL via INTRAVENOUS

## 2017-06-17 NOTE — ED Triage Notes (Signed)
Pt was discharged this morning but reports her headache returned as soon as she got home.  Reports she followed provider's advise took Ibprofen but when she tried to lie down her headache got so bad she kept waking up.  Pt tearful in triage, no neuro deficiens.

## 2017-06-17 NOTE — Discharge Instructions (Addendum)
Please take Ibuprofen (Advil, motrin) and Tylenol (acetaminophen) to relieve your pain.  You may take up to 600 MG (3 pills) of normal strength ibuprofen every 8 hours as needed.  In between doses of ibuprofen you make take tylenol, up to 1,000 mg (two extra strength pills).  Do not take more than 3,000 mg tylenol in a 24 hour period.  Please check all medication labels as many medications such as pain and cold medications may contain tylenol.  Do not drink alcohol while taking these medications.  Do not take other NSAID'S while taking ibuprofen (such as aleve or naproxen).  Please take ibuprofen with food to decrease stomach upset.  If your chest pain comes back, you develop shortness of breath, fevers, feel lightheaded or dizzy, or has any other concerns please seek additional medical care and evaluation.

## 2017-06-17 NOTE — ED Provider Notes (Signed)
Ass[facility] care of patient at shift [facility].  Briefly patient is a 37 year old woman who presents today for evaluation of chest pain for approximately 24 hours.  Low risk heart score opponents x2.  She has a low risk for DVT, however has a positive d-dimer plan to follow-up on CT angios.  Patient also reports a headache, on my evaluation it is occipital, easily re-created with palpation over cervical paraspinal muscles.    On my evaluation patient is resting comfortably, pain free.  CT angios does not show evidence of PEs, does show a small hiatal hernia, and fluid in the distal esophagus which is reportedly consistent with GERD.  Patient reports that her headache is slightly improved, however is still there, is consistent with a tension type headache, discussed with patient that we could try ibuprofen and Tylenol for this, however patient was urged to go home at this time.  Patient instructed to follow-up with her PCP for further evaluation.  Patient denies current shortness of breath, chest pain, or other concerns at this time.  Return precautions were discussed and patient states her understanding.   Dg Chest 2 View  Result Date: 06/16/2017 CLINICAL DATA:  Chest pain EXAM: CHEST - 2 VIEW COMPARISON:  05/02/2015 FINDINGS: Cardiomegaly. No confluent opacities, effusions or edema. No acute bony abnormality. IMPRESSION: Mild cardiomegaly.  No active disease. Electronically Signed   By: Charlett NoseKevin  Dover M.D.   On: 06/16/2017 22:23   Ct Angio Chest Pe W And/or Wo Contrast  Result Date: 06/17/2017 CLINICAL DATA:  Midline chest pain since yesterday morning. EXAM: CT ANGIOGRAPHY CHEST WITH CONTRAST TECHNIQUE: Multidetector CT imaging of the chest was performed using the standard protocol during bolus administration of intravenous contrast. Multiplanar CT image reconstructions and MIPs were obtained to evaluate the vascular anatomy. CONTRAST:  <See Chart> ISOVUE-370 IOPAMIDOL (ISOVUE-370)  INJECTION 76%, <See Chart> ISOVUE-370 IOPAMIDOL (ISOVUE-370) INJECTION 76% COMPARISON:  05/11/2014 FINDINGS: Cardiovascular: Satisfactory opacification of the pulmonary arteries to the segmental level. No evidence of pulmonary embolism. Normal heart size. No pericardial effusion. Normal caliber thoracic aorta. No thoracic aortic dissection. Mediastinum/Nodes: No enlarged mediastinal, hilar, or axillary lymph nodes. Thyroid gland and trachea demonstrate no significant findings. Fluid in the distal esophagus as can be seen with gastroesophageal reflux. Lungs/Pleura: Lungs are clear. No pleural effusion or pneumothorax. Upper Abdomen: No acute abnormality.  Small hiatal hernia. Musculoskeletal: No chest wall abnormality. No acute or significant osseous findings. Review of the MIP images confirms the above findings. IMPRESSION: 1. No evidence of pulmonary embolus. 2. Normal thoracic aorta without dissection. 3. Small hiatal hernia. Small amount of fluid in the distal esophagus as can be seen with gastroesophageal reflux. Electronically Signed   By: Elige KoHetal  Patel   On: 06/17/2017 08:08   [place] Ob Transvaginal  Result Date: 05/24/2017 CLINICAL DATA:  37 year old female with vaginal bleeding. Concern for miscarriage. EXAM: TRANSVAGINAL OB ULTRASOUND TECHNIQUE: Transvaginal ultrasound was performed for complete evaluation of the gestation as well as the maternal uterus, adnexal regions, and pelvic cul-de-sac. COMPARISON:  Ultrasound dated 05/03/2017 FINDINGS: The uterus is anteverted. The endometrium is heterogeneous and thickened with echogenic content measuring up to 2.6 cm in thickness. The previously seen intrauterine pregnancy is no longer visualized on the current study consistent with miscarriage. Echogenic content within the endometrium may represent blood products/clot. Retained products of conception is not entirely excluded. Correlation with clinical exam and serial HCG levels recommended. The ovaries appear  unremarkable. There is a corpus luteum in the left  ovary. There is trace free fluid within the pelvis. IMPRESSION: 1. Interval miscarriage of the previously seen IUP. Echogenic content within the endometrium, likely blood products/clot. Retained products of conception is not entirely excluded. Clinical correlation is recommended. 2. Unremarkable ovaries. Electronically Signed   By: Elgie Collard M.D.   On: 05/24/2017 23:39      Cristina Gong, PA-C 06/17/17 0913    Ward, Layla Maw, DO 06/17/17 1324

## 2017-06-17 NOTE — ED Provider Notes (Signed)
[facility] EMERGENCY DEPARTMENT Provider Note   CSN: [id number] Arrival date & time: 06/16/17  2153     History   Chief Complaint Chief Complaint  Patient presents with  . Chest Pain    HPI Mindy Wade is a 37 y.o. female with a hx of HTN, hyperlipidemia, PE (2016 after miscarriage, not currently anticoagulated) presents to the Emergency Department complaining of gradual, waxing and waning, progressively worsening central chest pain onset approximately 24 hours ago.  Patient reports that the pain kept her from sleeping well overnight on Saturday night into Sunday morning.  She reports the pain is left-sided in her chest and radiates to the right side of her chest and into her left shoulder.  She states that the pain is not constant but does worsen when she walks or moves.  She states that some positions alleviate the pain when sitting or lying but others aggravate it.  She denies pleuritic pain.  She denies dyspnea on exertion.  Patient reports no previous cardiac history.  She denies currently taking birth control, smoking, history of lupus, history of cancer, recent travel or immobilization or recent surgeries.  Additionally, patient reports associated generalized and throbbing headache onset while waiting in the waiting room.  She reports the headache was not the worst headache of her life, sudden onset or thunderclap.  She denies vision changes, neck stiffness, fevers, chills, slurred speech, numbness, tingling, weakness.  She denies abdominal pain, nausea, vomiting, diarrhea weakness, dizziness, syncope, diaphoresis.  The history is provided by the patient and medical records. No language interpreter was used.    Past Medical History:  Diagnosis Date  . Breast feeding status of mother   . Hx of blood clots   . Hx of varicella   . Hyperlipemia   . Hypertension   . Pregnancy induced hypertension     Patient Active Problem List   Diagnosis Date Noted  .  Missed abortion with fetal demise before 20 completed weeks of gestation 05/03/2017  . Precipitous delivery 02/19/2015  . Postpartum care following vaginal delivery 02/19/2015  . Active labor at term 02/18/2015  . Pre-diabetes 05/14/2014  . Elevated LDL cholesterol level 05/14/2014  . Pulmonary embolism (HCC) 10/17/2013  . Labor and delivery indication for care or intervention 10/09/2013  . Spontaneous abortion in second trimester 10/09/2013  . SVD (spontaneous vaginal delivery) 03/02/2013    Past Surgical History:  Procedure Laterality Date  . CHOLECYSTECTOMY       OB History    Gravida  8   Para  7   Term  6   Preterm  1   AB  1   Living  6     SAB  1   TAB      Ectopic      Multiple  0   Live Births  7            Home Medications    Prior to Admission medications   Medication Sig Start Date End Date Taking? Authorizing Provider  amLODipine (NORVASC) 10 MG tablet Take 10 mg by mouth daily.   Yes [provider]  labetalol (NORMODYNE) 200 MG tablet Take 200 mg by mouth 2 (two) times daily. Reported on 05/02/2015   Yes [provider]  NORLYDA 0.35 MG tablet TAKE 1 TABLET BY MOUTH DAILY 02/08/17  Yes [provider]  rosuvastatin (CRESTOR) 5 MG tablet Take 5 mg by mouth daily.   Yes [provider]  spironolactone (ALDACTONE)  25 MG tablet Take 25 mg by mouth daily.   Yes [provider]  acetaminophen-codeine (TYLENOL #3) 300-30 MG tablet Take 1-2 tablets by mouth every 6 (six) hours as needed for moderate pain. Patient not taking: Reported on 06/15/2017 05/03/17   Raelyn Moraawson, Rolitta, CNM  ferrous sulfate 325 (65 FE) MG tablet Take 1 tablet (325 mg total) by mouth daily. Patient not taking: Reported on 06/15/2017 05/24/17   Rolm BookbinderNeill, Caroline M, CNM  ibuprofen (ADVIL,MOTRIN) 800 MG tablet Take 1 tablet (800 mg total) by mouth every 6 (six) hours as needed. Patient not taking: Reported on 06/15/2017 05/24/17   Rolm BookbinderNeill, Caroline  M, CNM  misoprostol (CYTOTEC) 200 MCG tablet Place 4 tablets (800 mcg total) vaginally once for 1 dose. Patient not taking: Reported on 06/15/2017 05/03/17 06/15/25  Raelyn Moraawson, Rolitta, CNM  misoprostol (CYTOTEC) 200 MCG tablet Take 4 tablets (800 mcg total) by mouth once for 1 dose. Patient not taking: Reported on 06/15/2017 05/25/17 06/15/25  Rolm BookbinderNeill, Caroline M, CNM  promethazine (PHENERGAN) 12.5 MG tablet Take 1 tablet (12.5 mg total) by mouth every 4 (four) hours as needed for nausea or vomiting. Patient not taking: Reported on 06/15/2017 05/03/17   Raelyn Moraawson, Rolitta, CNM    Family History Family History  Problem Relation Age of Onset  . Asthma Mother   . Heart disease Mother   . Hypertension Mother   . Stroke Mother   . Cancer Father        prostate  . Alcohol abuse Neg Hx   . Arthritis Neg Hx   . Birth defects Neg Hx   . COPD Neg Hx   . Depression Neg Hx   . Diabetes Neg Hx   . Drug abuse Neg Hx   . Early death Neg Hx   . Hearing loss Neg Hx   . Hyperlipidemia Neg Hx   . Kidney disease Neg Hx   . Learning disabilities Neg Hx   . Mental illness Neg Hx   . Mental retardation Neg Hx   . Miscarriages / Stillbirths Neg Hx   . Vision loss Neg Hx   . Varicose Veins Neg Hx     Social History Social History   Tobacco Use  . Smoking status: Never Smoker  . Smokeless tobacco: Never Used  Substance Use Topics  . Alcohol use: No  . Drug use: No     Allergies   Penicillins   Review of Systems Review of Systems  Constitutional: Negative for appetite change, diaphoresis, fatigue, fever and unexpected weight change.  HENT: Negative for mouth sores.   Eyes: Negative for visual disturbance.  Respiratory: Negative for cough, chest tightness, shortness of breath and wheezing.   Cardiovascular: Positive for chest pain.  Gastrointestinal: Negative for abdominal pain, constipation, diarrhea, nausea and vomiting.  Endocrine: Negative for polydipsia, polyphagia and polyuria.  Genitourinary:  Negative for dysuria, frequency, hematuria and urgency.  Musculoskeletal: Negative for neck stiffness.  Skin: Negative for rash.  Allergic/Immunologic: Negative for immunocompromised state.  Neurological: Positive for headaches. Negative for syncope and light-headedness.  Hematological: Does not bruise/bleed easily.  Psychiatric/Behavioral: Negative for sleep disturbance. The patient is not nervous/anxious.      Physical Exam Updated Vital Signs BP (!) 144/94 (BP Location: Right Arm)   Pulse (!) 53   Temp 98.2 F (36.8 C) (Oral)   Resp 18   Ht 5\' 8"  (1.727 m)   Wt 124.7 kg (275 lb)   LMP  (LMP Unknown)   SpO2 100%  BMI 41.81 kg/m   Physical Exam  Constitutional: She appears well-developed and well-nourished. No distress.  Awake, alert, nontoxic appearance  HENT:  Head: Normocephalic and atraumatic.  Mouth/Throat: Oropharynx is clear and moist. No oropharyngeal exudate.  Eyes: Conjunctivae are normal. No scleral icterus.  Neck: Normal range of motion. Neck supple.  Cardiovascular: Normal rate, regular rhythm and intact distal pulses.  Pulses:      Radial pulses are 2+ on the right side, and 2+ on the left side.       Posterior tibial pulses are 2+ on the right side, and 2+ on the left side.  Pulmonary/Chest: Effort normal and breath sounds normal. No respiratory distress. She has no wheezes.  Equal chest expansion  Abdominal: Soft. Bowel sounds are normal. She exhibits no mass. There is no tenderness. There is no rebound and no guarding.  Musculoskeletal: Normal range of motion. She exhibits no edema.       Right lower leg: She exhibits no edema.  No calf tenderness or peripheral edema bilaterally  Neurological: She is alert.  Speech is clear and goal oriented Moves extremities without ataxia Sensation intact to normal touch throughout the bilateral upper and lower extremities Strength 5/5 in the upper and lower extremities including grip strength.  Skin: Skin is warm  and dry. She is not diaphoretic.  Psychiatric: She has a normal mood and affect.  Nursing note and vitals reviewed.    ED Treatments / Results  Labs (all labs ordered are listed, but only abnormal results are displayed) Labs Reviewed  CBC - Abnormal; Notable for the following components:      Result Value   Hemoglobin 11.7 (*)    All other components within normal limits  D-DIMER, QUANTITATIVE (NOT AT [facility]) - Abnormal; Notable for the following components:   D-Dimer, Quant 0.77 (*)    All other components within normal limits  BASIC METABOLIC PANEL  I-STAT TROPONIN, ED  I-STAT BETA HCG BLOOD, ED ([facility], [facility], [facility] ONLY)  I-STAT TROPONIN, ED    EKG EKG Interpretation  Date/Time:  Sunday June 16 2017 22:00:47 EDT Ventricular Rate:  61 PR Interval:  148 QRS Duration: 86 QT Interval:  386 QTC Calculation: 388 R Axis:   75 Text Interpretation:  Sinus rhythm with marked sinus arrhythmia Possible Anterior infarct , age undetermined Abnormal ECG No significant change since last tracing other than rate is slower Confirmed by Rochele Raring [phone]) on 06/17/2017 2:50:21 AM   Radiology Dg Chest 2 View  Result Date: 06/16/2017 CLINICAL DATA:  Chest pain EXAM: CHEST - 2 VIEW COMPARISON:  05/02/2015 FINDINGS: Cardiomegaly. No confluent opacities, effusions or edema. No acute bony abnormality. IMPRESSION: Mild cardiomegaly.  No active disease. Electronically Signed   By: Charlett Nose M.D.   On: 06/16/2017 22:23    Procedures Procedures (including critical care time)  Medications Ordered in ED Medications  sodium chloride 0.9 % bolus 1,000 mL (1,000 mLs Intravenous New Bag/Given 06/17/17 0543)  metoCLOPramide (REGLAN) injection 10 mg (10 mg Intravenous Given 06/17/17 0543)  ketorolac (TORADOL) 30 MG/ML injection 30 mg (30 mg Intravenous Given 06/17/17 0543)     Initial Impression / Assessment and Plan / ED Course  I have reviewed the triage vital signs and the nursing notes.  Pertinent  labs & imaging results that were available during my care of the patient were reviewed by me and considered in my medical decision making (see chart for details).     Patient presents with waxing and waning  chest pain.  She has a history of pulmonary embolism with a previous pregnancy.  She is not currently pregnant and has no other risk factors for DVT.  Patient is well-appearing.  She reports compliance with her home medications.  No electrolyte abnormalities.  Initial and repeat troponin are negative.  EKG is unchanged from previous and is not acutely ischemic.  No evidence of acute coronary syndrome.  Chest x-ray is without evidence of pneumonia, pulmonary edema or pneumothorax.  I personally evaluated these images.  Hemoglobin noted to be 11.7.  Patient is slightly anemic and has a history of same.  Patient is low risk for DVT however due to the nature of her pain d-dimer is pending.  She is not tachycardic, tachypneic or hypoxic.  She has had no hemoptysis.  Additionally, patient is receiving fluids and medication for her headache.  Patient without focal neurologic deficits.  Doubt CVA, intracranial hemorrhage or intracranial mass as the cause of her headache.  6:19 AM D-dimer elevated with h/o PE, not currently anticoagulated.  CT angio ordered.    At shift change care was transferred to Lyndel Safe, PA-C who will follow pending studies, re-evaulate and determine disposition.     Final Clinical Impressions(s) / ED Diagnoses   Final diagnoses:  Left-sided chest pain  Generalized headache  Elevated d-dimer    ED Discharge Orders    None       Mardene Sayer Boyd Kerbs 06/17/17 7829    Ward, Layla Maw, DO 06/17/17 [phone]

## 2017-06-17 NOTE — ED Notes (Signed)
ED Provider at bedside. 

## 2017-06-17 NOTE — ED Notes (Signed)
Patient transported to CT 

## 2017-06-18 ENCOUNTER — Emergency Department (HOSPITAL_COMMUNITY)
Admission: EM | Admit: 2017-06-18 | Discharge: 2017-06-18 | Disposition: A | Discharge status: Home / Self Care | Payer: 59 | Attending: Emergency Medicine | Admitting: Emergency Medicine

## 2017-06-18 ENCOUNTER — Emergency Department (HOSPITAL_COMMUNITY): Payer: 59

## 2017-06-18 ENCOUNTER — Encounter (HOSPITAL_COMMUNITY): Payer: Self-pay | Admitting: *Deleted

## 2017-06-18 DIAGNOSIS — R519 Headache, unspecified: Secondary | ICD-10-CM

## 2017-06-18 DIAGNOSIS — Z79899 Other long term (current) drug therapy: Secondary | ICD-10-CM | POA: Diagnosis not present

## 2017-06-18 DIAGNOSIS — I10 Essential (primary) hypertension: Secondary | ICD-10-CM | POA: Diagnosis not present

## 2017-06-18 DIAGNOSIS — R51 Headache: Secondary | ICD-10-CM

## 2017-06-18 NOTE — ED Triage Notes (Signed)
To ED for eval of HA. States she has been seen several times in the last few days for HA, CP, and dizziness. No dx found. Sent home instructed to take ibuprofen and tylenol but has had no relief. Pt states she was last here last pm but wait was too long. Also with decreased appetite. Intermittent photophobia. No vomiting. lmp begging of April. States 'they have never checked my head'.

## 2017-06-18 NOTE — ED Provider Notes (Signed)
[facility] EMERGENCY DEPARTMENT Provider Note   CSN: [id number] Arrival date & time: 06/18/17  4098     History   Chief Complaint Chief Complaint  Patient presents with  . Headache    HPI Mindy Wade is a 37 y.o. female presenting for evaluation of headache.  Patient states she has had a headache since Sunday.  She is not sure what she was doing when the pain started.  She has been evaluated multiple times in the ER since for other things including chest pain, but has not been found to have an emergent process.  She was given a headache cocktail while in the ER without improvement of symptoms.  She is very anxious and concerned that there might be something serious going on.  She is requesting imaging of her head.  She denies fall, trauma, or injury.  She reports history of headaches, although states this feels different.  She reports associated photophobia, denies vision changes, slurred speech, weakness, current chest pain, shortness breath, nausea, vomiting, abdominal pain, urinary symptoms, abnormal bowel movements.  She has been taking ibuprofen without improvement of symptoms.  She has a history of high blood pressure for which she takes 3 different medications, has taken them today.  She has not contacted her primary care doctor about her headaches.  Headache is occipital, radiating towards the front of her head.  She reports stiffness and soreness that extends into her upper back bilaterally.  Patient denies increased stress or anxiety, although appears anxious during the exam.   HPI  Past Medical History:  Diagnosis Date  . Breast feeding status of mother   . Hx of blood clots   . Hx of varicella   . Hyperlipemia   . Hypertension   . Pregnancy induced hypertension     Patient Active Problem List   Diagnosis Date Noted  . Missed abortion with fetal demise before 20 completed weeks of gestation 05/03/2017  . Precipitous delivery 02/19/2015  .  Postpartum care following vaginal delivery 02/19/2015  . Active labor at term 02/18/2015  . Pre-diabetes 05/14/2014  . Elevated LDL cholesterol level 05/14/2014  . Pulmonary embolism (HCC) 10/17/2013  . Labor and delivery indication for care or intervention 10/09/2013  . Spontaneous abortion in second trimester 10/09/2013  . SVD (spontaneous vaginal delivery) 03/02/2013    Past Surgical History:  Procedure Laterality Date  . CHOLECYSTECTOMY       OB History    Gravida  8   Para  7   Term  6   Preterm  1   AB  1   Living  6     SAB  1   TAB      Ectopic      Multiple  0   Live Births  7            Home Medications    Prior to Admission medications   Medication Sig Start Date End Date Taking? Authorizing Provider  acetaminophen-codeine (TYLENOL #3) 300-30 MG tablet Take 1-2 tablets by mouth every 6 (six) hours as needed for moderate pain. Patient not taking: Reported on 06/15/2017 05/03/17   Raelyn Mora, CNM  amLODipine (NORVASC) 10 MG tablet Take 10 mg by mouth daily.    [provider]  ferrous sulfate 325 (65 FE) MG tablet Take 1 tablet (325 mg total) by mouth daily. Patient not taking: Reported on 06/15/2017 05/24/17   Rolm Bookbinder, CNM  ibuprofen (ADVIL,MOTRIN) 800 MG tablet Take 1  tablet (800 mg total) by mouth every 6 (six) hours as needed. Patient not taking: Reported on 06/15/2017 05/24/17   Rolm Bookbinder, CNM  labetalol (NORMODYNE) 200 MG tablet Take 200 mg by mouth 2 (two) times daily. Reported on 05/02/2015    [provider]  misoprostol (CYTOTEC) 200 MCG tablet Place 4 tablets (800 mcg total) vaginally once for 1 dose. Patient not taking: Reported on 06/15/2017 05/03/17 06/15/25  Raelyn Mora, CNM  misoprostol (CYTOTEC) 200 MCG tablet Take 4 tablets (800 mcg total) by mouth once for 1 dose. Patient not taking: Reported on 06/15/2017 05/25/17 06/15/25  Rolm Bookbinder, CNM  NORLYDA 0.35 MG tablet TAKE 1 TABLET BY MOUTH DAILY  02/08/17   [provider]  promethazine (PHENERGAN) 12.5 MG tablet Take 1 tablet (12.5 mg total) by mouth every 4 (four) hours as needed for nausea or vomiting. Patient not taking: Reported on 06/15/2017 05/03/17   Raelyn Mora, CNM  rosuvastatin (CRESTOR) 10 MG tablet Take 10 mg by mouth at bedtime.     [provider]  spironolactone (ALDACTONE) 25 MG tablet Take 25 mg by mouth daily.    [provider]    Family History Family History  Problem Relation Age of Onset  . Asthma Mother   . Heart disease Mother   . Hypertension Mother   . Stroke Mother   . Cancer Father        prostate  . Alcohol abuse Neg Hx   . Arthritis Neg Hx   . Birth defects Neg Hx   . COPD Neg Hx   . Depression Neg Hx   . Diabetes Neg Hx   . Drug abuse Neg Hx   . Early death Neg Hx   . Hearing loss Neg Hx   . Hyperlipidemia Neg Hx   . Kidney disease Neg Hx   . Learning disabilities Neg Hx   . Mental illness Neg Hx   . Mental retardation Neg Hx   . Miscarriages / Stillbirths Neg Hx   . Vision loss Neg Hx   . Varicose Veins Neg Hx     Social History Social History   Tobacco Use  . Smoking status: Never Smoker  . Smokeless tobacco: Never Used  Substance Use Topics  . Alcohol use: No  . Drug use: No     Allergies   Penicillins   Review of Systems Review of Systems  Eyes: Positive for photophobia. Negative for visual disturbance.  Musculoskeletal: Positive for myalgias.  Neurological: Positive for headaches.  All other systems reviewed and are negative.    Physical Exam Updated Vital Signs BP 119/85   Pulse 71   Temp 98.3 F (36.8 C) (Oral)   Resp 17   LMP 05/28/2017 (Exact Date)   SpO2 97%   Physical Exam  Constitutional: She is oriented to person, place, and time. She appears well-developed and well-nourished. No distress.  Patient sitting comfortably no apparent distress.  Patient does appear anxious, when discussing plans, patient starts crying  because she is concerned there is something bad going on.  HENT:  Head: Normocephalic and atraumatic.  Right Ear: Tympanic membrane, external ear and ear canal normal.  Left Ear: Tympanic membrane, external ear and ear canal normal.  Nose: Nose normal.  Mouth/Throat: Uvula is midline, oropharynx is clear and moist and mucous membranes are normal.  Eyes: Pupils are equal, round, and reactive to light. Conjunctivae and EOM are normal.  Neck: Normal range of motion. Neck supple.  Moving head and neck without signs of pain or stiffness.  No tenderness palpation of midline C-spine.  No step-offs.  Cardiovascular: Normal rate, regular rhythm and intact distal pulses.  Pulmonary/Chest: Effort normal and breath sounds normal. No respiratory distress. She has no wheezes.  Abdominal: Soft. She exhibits no distension and no mass. There is no tenderness. There is no guarding.  Musculoskeletal: Normal range of motion. She exhibits no edema, tenderness or deformity.  Strength intact x4.  Sensation intact x4.  Radial and pedal pulses equal bilaterally.  No leg pain or swelling.  No obvious deformity.  Patient ambulatory without difficulty.  Neurological: She is alert and oriented to person, place, and time. She has normal strength. No cranial nerve deficit or sensory deficit. Coordination and gait normal. GCS eye subscore is 4. GCS verbal subscore is 5. GCS motor subscore is 6.  No obvious neurologic deficits.  Nose to finger intact. CN intact.   Skin: Skin is warm and dry.  Psychiatric: She has a normal mood and affect.  Nursing note and vitals reviewed.    ED Treatments / Results  Labs (all labs ordered are listed, but only abnormal results are displayed) Labs Reviewed - No data to display  EKG None  Radiology Dg Chest 2 View  Result Date: 06/16/2017 CLINICAL DATA:  Chest pain EXAM: CHEST - 2 VIEW COMPARISON:  05/02/2015 FINDINGS: Cardiomegaly. No confluent opacities, effusions or edema. No  acute bony abnormality. IMPRESSION: Mild cardiomegaly.  No active disease. Electronically Signed   By: Charlett NoseKevin  Dover M.D.   On: 06/16/2017 22:23   Ct Head Wo Contrast  Result Date: 06/18/2017 CLINICAL DATA:  Headache EXAM: CT HEAD WITHOUT CONTRAST TECHNIQUE: Contiguous axial images were obtained from the base of the skull through the vertex without intravenous contrast. COMPARISON:  CT brain 07/17/2008 FINDINGS: Brain: No evidence of acute infarction, hemorrhage, hydrocephalus, extra-axial collection or mass lesion/mass effect. Vascular: No hyperdense vessel or unexpected calcification. Skull: Normal. Negative for fracture or focal lesion. Sinuses/Orbits: Moderate mucosal thickening in the left maxillary sinus with mild mucosal thickening in the ethmoid sinuses. Other: None IMPRESSION: 1. Negative non contrasted CT appearance of the brain 2. Sinus disease Electronically Signed   By: Jasmine PangKim  Fujinaga M.D.   On: 06/18/2017 14:05   Ct Angio Chest Pe W And/or Wo Contrast  Result Date: 06/17/2017 CLINICAL DATA:  Midline chest pain since yesterday morning. EXAM: CT ANGIOGRAPHY CHEST WITH CONTRAST TECHNIQUE: Multidetector CT imaging of the chest was performed using the standard protocol during bolus administration of intravenous contrast. Multiplanar CT image reconstructions and MIPs were obtained to evaluate the vascular anatomy. CONTRAST:  <See Chart> ISOVUE-370 IOPAMIDOL (ISOVUE-370) INJECTION 76%, <See Chart> ISOVUE-370 IOPAMIDOL (ISOVUE-370) INJECTION 76% COMPARISON:  05/11/2014 FINDINGS: Cardiovascular: Satisfactory opacification of the pulmonary arteries to the segmental level. No evidence of pulmonary embolism. Normal heart size. No pericardial effusion. Normal caliber thoracic aorta. No thoracic aortic dissection. Mediastinum/Nodes: No enlarged mediastinal, hilar, or axillary lymph nodes. Thyroid gland and trachea demonstrate no significant findings. Fluid in the distal esophagus as can be seen with  gastroesophageal reflux. Lungs/Pleura: Lungs are clear. No pleural effusion or pneumothorax. Upper Abdomen: No acute abnormality.  Small hiatal hernia. Musculoskeletal: No chest wall abnormality. No acute or significant osseous findings. Review of the MIP images confirms the above findings. IMPRESSION: 1. No evidence of pulmonary embolus. 2. Normal thoracic aorta without dissection. 3. Small hiatal hernia. Small amount of fluid in the distal esophagus as can be seen with gastroesophageal reflux. Electronically  Signed   By: Elige Ko   On: 06/17/2017 08:08    Procedures Procedures (including critical care time)  Medications Ordered in ED Medications - No data to display   Initial Impression / Assessment and Plan / ED Course  I have reviewed the triage vital signs and the nursing notes.  Pertinent labs & imaging results that were available during my care of the patient were reviewed by me and considered in my medical decision making (see chart for details).     Pt presenting for evaluation of headache.  Physical exam reassuring, no neurologic deficits.  No sign of pain with movement of her head or neck.  No fevers, chills, or tachycardia.  Patient appears nontoxic.  Doubt meningitis.  Patient insisting on a scan of her head.  Discussed risks of radiation, patient states she understands.  Will obtain CT head for evaluation.  CT head without acute or concerning findings.  At this time, doubt CVA, PRES, or infection. Labs performed in the past several days without concerning findings.  Discussed with patient.  Discussed that her headache is likely related to tension and stress.  She should follow-up with her primary care doctor.  Continue over-the-counter medications for symptom relief.  Discussed that her blood pressure has remained elevated despite her medication, she should follow-up with her primary care doctor about her elevated blood pressure.  At this time, patient appears safe for discharge.   Return precautions given.  Patient states she understands and agrees to plan.   Final Clinical Impressions(s) / ED Diagnoses   Final diagnoses:  Nonintractable headache, unspecified chronicity pattern, unspecified headache type  Hypertension, unspecified type    ED Discharge Orders    None       Alveria Apley, PA-C 06/18/17 1643    Little, Ambrose Finland, MD 06/25/17 0700

## 2017-06-18 NOTE — Discharge Instructions (Addendum)
Continue taking your medications as prescribed. Use Tylenol or ibuprofen as needed for headache. Follow up with your primary care doctor for further evaluation and management of your headache and blood pressure. Return to the emergency room if you develop vision loss, slurred speech, weakness, chest pain, or any new or concerning symptoms.

## 2017-06-18 NOTE — ED Notes (Addendum)
Patient states she has had a headache since Sunday, it radiates towards the back of the neck.  Patient describes pain as high pressure, tightness and swelling.  Patient woke up this morning after taking medication yesterday and fell dizzy, lightheadedness (comes and goes). Patient said she has had headaches before and had blurry vision and a hard time hearing.  Patient has high cholesterol and hypertension.  Takes medication for both conditions.  Patient sleeps 5-6 hours regularly and drinks 5-8 glasses of water (8 ounces).

## 2017-06-18 NOTE — ED Notes (Signed)
Called for vitals multiple times with no answer 

## 2017-06-29 ENCOUNTER — Encounter (HOSPITAL_COMMUNITY): Payer: Self-pay | Admitting: Nurse Practitioner

## 2017-06-29 ENCOUNTER — Emergency Department (HOSPITAL_COMMUNITY): Payer: 59

## 2017-06-29 ENCOUNTER — Emergency Department (HOSPITAL_COMMUNITY)
Admission: EM | Admit: 2017-06-29 | Discharge: 2017-06-29 | Disposition: A | Discharge status: Home / Self Care | Payer: 59 | Attending: Emergency Medicine | Admitting: Emergency Medicine

## 2017-06-29 DIAGNOSIS — Z79899 Other long term (current) drug therapy: Secondary | ICD-10-CM | POA: Diagnosis not present

## 2017-06-29 DIAGNOSIS — R079 Chest pain, unspecified: Secondary | ICD-10-CM | POA: Diagnosis present

## 2017-06-29 DIAGNOSIS — I10 Essential (primary) hypertension: Secondary | ICD-10-CM | POA: Insufficient documentation

## 2017-06-29 LAB — CBC WITH DIFFERENTIAL/PLATELET
Basophils Absolute: 0 10*3/uL (ref 0.0–0.1)
Basophils Relative: 0 %
Eosinophils Absolute: 0.1 10*3/uL (ref 0.0–0.7)
Eosinophils Relative: 2 %
HCT: 36.1 % (ref 36.0–46.0)
Hemoglobin: 11.9 g/dL — ABNORMAL LOW (ref 12.0–15.0)
Lymphocytes Relative: 38 %
Lymphs Abs: 2.6 10*3/uL (ref 0.7–4.0)
MCH: 30.4 pg (ref 26.0–34.0)
MCHC: 33 g/dL (ref 30.0–36.0)
MCV: 92.3 fL (ref 78.0–100.0)
Monocytes Absolute: 0.6 10*3/uL (ref 0.1–1.0)
Monocytes Relative: 9 %
Neutro Abs: 3.5 10*3/uL (ref 1.7–7.7)
Neutrophils Relative %: 51 %
Platelets: 313 10*3/uL (ref 150–400)
RBC: 3.91 MIL/uL (ref 3.87–5.11)
RDW: 13.9 % (ref 11.5–15.5)
WBC: 6.9 10*3/uL (ref 4.0–10.5)

## 2017-06-29 LAB — BASIC METABOLIC PANEL
Anion gap: 9 (ref 5–15)
BUN: 10 mg/dL (ref 6–20)
CO2: 23 mmol/L (ref 22–32)
Calcium: 9.3 mg/dL (ref 8.9–10.3)
Chloride: 107 mmol/L (ref 101–111)
Creatinine, Ser: 0.79 mg/dL (ref 0.44–1.00)
GFR calc Af Amer: >60 mL/min (ref >60)
GFR calc non Af Amer: >60 mL/min (ref >60)
Glucose, Bld: 105 mg/dL — ABNORMAL HIGH (ref 65–99)
Potassium: 3.9 mmol/L (ref 3.5–5.1)
Sodium: 139 mmol/L (ref 135–145)

## 2017-06-29 LAB — TROPONIN I: Troponin I: <0.03 ng/mL (ref <0.03)

## 2017-06-29 MED ORDER — OXYCODONE-ACETAMINOPHEN 5-325 MG PO TABS
1.0000 | ORAL_TABLET | Freq: Once | ORAL | Status: AC
  Administered 2017-06-29: 1 via ORAL
  Filled 2017-06-29: qty 1

## 2017-06-29 NOTE — ED Provider Notes (Signed)
[facility]-EMERGENCY DEPT Provider Note   CSN: [id number] Arrival date & time: 06/29/17  1501     History   Chief Complaint Chief Complaint  Patient presents with  . Chest Pain    HPI Mindy Wade is a 37 y.o. female.  HPI   37 year old female with chest pain.  Onset around 10 AM this morning as she is getting up from bed.  Describes substernal chest pressure.  This has been constant since onset.  Pain is perhaps somewhat worse sitting forward asked to laying on her back.  She has not noticed any appreciable exacerbating relieving factors otherwise.  She denies any other associated symptoms such as dyspnea, palpitations, nausea or diaphoresis.  She took her blood pressure at home because the way she was feeling and she says it was "close to the 200s over 100s."  She states that she has had similar pain previously and had cardiology evaluation stress testing for the same which was unremarkable per her report.  Past Medical History:  Diagnosis Date  . Breast feeding status of mother   . Hx of blood clots   . Hx of varicella   . Hyperlipemia   . Hypertension   . Pregnancy induced hypertension     Patient Active Problem List   Diagnosis Date Noted  . Missed abortion with fetal demise before 20 completed weeks of gestation 05/03/2017  . Precipitous delivery 02/19/2015  . Postpartum care following vaginal delivery 02/19/2015  . Active labor at term 02/18/2015  . Pre-diabetes 05/14/2014  . Elevated LDL cholesterol level 05/14/2014  . Pulmonary embolism (HCC) 10/17/2013  . Labor and delivery indication for care or intervention 10/09/2013  . Spontaneous abortion in second trimester 10/09/2013  . SVD (spontaneous vaginal delivery) 03/02/2013    Past Surgical History:  Procedure Laterality Date  . CHOLECYSTECTOMY       OB History    Gravida  8   Para  7   Term  6   Preterm  1   AB  1   Living  6     SAB  1   TAB      Ectopic      Multiple  0   Live Births  7            Home Medications    Prior to Admission medications   Medication Sig Start Date End Date Taking? Authorizing Provider  acetaminophen-codeine (TYLENOL #3) 300-30 MG tablet Take 1-2 tablets by mouth every 6 (six) hours as needed for moderate pain. Patient not taking: Reported on 06/15/2017 05/03/17   Raelyn Mora, CNM  amLODipine (NORVASC) 10 MG tablet Take 10 mg by mouth daily.    [provider]  ferrous sulfate 325 (65 FE) MG tablet Take 1 tablet (325 mg total) by mouth daily. Patient not taking: Reported on 06/15/2017 05/24/17   Rolm Bookbinder, CNM  ibuprofen (ADVIL,MOTRIN) 800 MG tablet Take 1 tablet (800 mg total) by mouth every 6 (six) hours as needed. Patient not taking: Reported on 06/15/2017 05/24/17   Rolm Bookbinder, CNM  labetalol (NORMODYNE) 200 MG tablet Take 200 mg by mouth 2 (two) times daily. Reported on 05/02/2015    [provider]  misoprostol (CYTOTEC) 200 MCG tablet Place 4 tablets (800 mcg total) vaginally once for 1 dose. Patient not taking: Reported on 06/15/2017 05/03/17 06/15/25  Raelyn Mora, CNM  misoprostol (CYTOTEC) 200 MCG tablet Take 4 tablets (800 mcg total) by mouth once for 1  dose. Patient not taking: Reported on 06/15/2017 05/25/17 06/15/25  Rolm Bookbinder, CNM  NORLYDA 0.35 MG tablet TAKE 1 TABLET BY MOUTH DAILY 02/08/17   [provider]  promethazine (PHENERGAN) 12.5 MG tablet Take 1 tablet (12.5 mg total) by mouth every 4 (four) hours as needed for nausea or vomiting. Patient not taking: Reported on 06/15/2017 05/03/17   Raelyn Mora, CNM  rosuvastatin (CRESTOR) 10 MG tablet Take 10 mg by mouth at bedtime.     [provider]  spironolactone (ALDACTONE) 25 MG tablet Take 25 mg by mouth daily.    [provider]    Family History Family History  Problem Relation Age of Onset  . Asthma Mother   . Heart disease Mother   . Hypertension Mother   . Stroke Mother     . Cancer Father        prostate  . Alcohol abuse Neg Hx   . Arthritis Neg Hx   . Birth defects Neg Hx   . COPD Neg Hx   . Depression Neg Hx   . Diabetes Neg Hx   . Drug abuse Neg Hx   . Early death Neg Hx   . Hearing loss Neg Hx   . Hyperlipidemia Neg Hx   . Kidney disease Neg Hx   . Learning disabilities Neg Hx   . Mental illness Neg Hx   . Mental retardation Neg Hx   . Miscarriages / Stillbirths Neg Hx   . Vision loss Neg Hx   . Varicose Veins Neg Hx     Social History Social History   Tobacco Use  . Smoking status: Never Smoker  . Smokeless tobacco: Never Used  Substance Use Topics  . Alcohol use: No  . Drug use: No     Allergies   Penicillins   Review of Systems Review of Systems  All systems reviewed and negative, other than as noted in HPI.  Physical Exam Updated Vital Signs BP 129/87 (BP Location: Left Arm)   Pulse 89   Temp 99 F (37.2 C)   Resp 14   SpO2 100%   Physical Exam  Constitutional: She appears well-developed and well-nourished. No distress.  HENT:  Head: Normocephalic and atraumatic.  Eyes: Conjunctivae are normal. Right eye exhibits no discharge. Left eye exhibits no discharge.  Neck: Neck supple.  Cardiovascular: Normal rate, regular rhythm and normal heart sounds. Exam reveals no gallop and no friction rub.  No murmur heard. Pulmonary/Chest: Effort normal and breath sounds normal. No respiratory distress.  Abdominal: Soft. She exhibits no distension. There is no tenderness.  Musculoskeletal: She exhibits no edema or tenderness.  Lower extremities symmetric as compared to each other. No calf tenderness. Negative Homan's. No palpable cords.   Neurological: She is alert.  Skin: Skin is warm and dry.  Psychiatric: She has a normal mood and affect. Her behavior is normal. Thought content normal.  Nursing note and vitals reviewed.    ED Treatments / Results  Labs (all labs ordered are listed, but only abnormal results are  displayed) Labs Reviewed  CBC WITH DIFFERENTIAL/PLATELET - Abnormal; Notable for the following components:      Result Value   Hemoglobin 11.9 (*)    All other components within normal limits  BASIC METABOLIC PANEL - Abnormal; Notable for the following components:   Glucose, Bld 105 (*)    All other components within normal limits  TROPONIN I    EKG EKG Interpretation  Date/Time:  Saturday  Jun 29 2017 15:18:31 EDT Ventricular Rate:  67 PR Interval:    QRS Duration: 93 QT Interval:  372 QTC Calculation: 393 R Axis:   60 Text Interpretation:  Sinus rhythm Confirmed by Raeford Razor [phone]) on 06/29/2017 3:26:11 PM   Radiology Dg Chest 2 View  Result Date: 06/29/2017 CLINICAL DATA:  Chest pain EXAM: CHEST - 2 VIEW COMPARISON:  06/16/2017 chest radiograph. FINDINGS: Stable cardiomediastinal silhouette with normal heart size. No pneumothorax. No pleural effusion. Lungs appear clear, with no acute consolidative airspace disease and no pulmonary edema. IMPRESSION: No active cardiopulmonary disease. Electronically Signed   By: Delbert Phenix M.D.   On: 06/29/2017 16:05    Procedures Procedures (including critical care time)  Medications Ordered in ED Medications - No data to display   Initial Impression / Assessment and Plan / ED Course  I have reviewed the triage vital signs and the nursing notes.  Pertinent labs & imaging results that were available during my care of the patient were reviewed by me and considered in my medical decision making (see chart for details).     37 year old female with chest pain.  Sounds atypical for ACS given the constant duration since 7:00 this morning.  Troponin drawn more than 5 hours of the onset of symptoms and normal.  Her EKG does not appear acutely changed from priors.  Chest x-ray is clear.  She has no signs or symptoms of DVT.  She does not describe pleuritic pain oxygen saturations 100% on room air.  She is not tachycardic.  I have a low  suspicion for ACS, PE, dissection or other emergent process.  Final Clinical Impressions(s) / ED Diagnoses   Final diagnoses:  Chest pain, unspecified type    ED Discharge Orders    None       Raeford Razor, MD 06/29/17 [phone]

## 2017-06-29 NOTE — ED Triage Notes (Signed)
Pt is c/o moderate chest pain 8/10 that she describes as pressure on the chest. States onset was this morning around 10am. She states that she took her home BP and found "very high readings, close to 200s over 100s." Denies anyother symptoms including indigestion, shortness of breath, fatigue or malaise.

## 2017-06-30 ENCOUNTER — Emergency Department (HOSPITAL_COMMUNITY)
Admission: EM | Admit: 2017-06-30 | Discharge: 2017-06-30 | Disposition: A | Discharge status: Home / Self Care | Payer: 59 | Attending: Emergency Medicine | Admitting: Emergency Medicine

## 2017-06-30 ENCOUNTER — Encounter (HOSPITAL_COMMUNITY): Payer: Self-pay

## 2017-06-30 ENCOUNTER — Other Ambulatory Visit: Payer: Self-pay

## 2017-06-30 DIAGNOSIS — R51 Headache: Secondary | ICD-10-CM | POA: Diagnosis present

## 2017-06-30 DIAGNOSIS — J349 Unspecified disorder of nose and nasal sinuses: Secondary | ICD-10-CM | POA: Insufficient documentation

## 2017-06-30 DIAGNOSIS — H81399 Other peripheral vertigo, unspecified ear: Secondary | ICD-10-CM | POA: Insufficient documentation

## 2017-06-30 DIAGNOSIS — I10 Essential (primary) hypertension: Secondary | ICD-10-CM | POA: Insufficient documentation

## 2017-06-30 DIAGNOSIS — G44209 Tension-type headache, unspecified, not intractable: Secondary | ICD-10-CM | POA: Diagnosis not present

## 2017-06-30 DIAGNOSIS — Z79899 Other long term (current) drug therapy: Secondary | ICD-10-CM | POA: Insufficient documentation

## 2017-06-30 MED ORDER — FLUTICASONE PROPIONATE 50 MCG/ACT NA SUSP: 2.0000 | Freq: Every day | NASAL | 0 refills | Status: DC

## 2017-06-30 MED ORDER — METHOCARBAMOL 500 MG PO TABS: 1000.0000 mg | ORAL_TABLET | Freq: Four times a day (QID) | ORAL | 0 refills | Status: DC | PRN

## 2017-06-30 MED ORDER — LORATADINE 10 MG PO TABS: 10.0000 mg | ORAL_TABLET | Freq: Every day | ORAL | 0 refills | Status: DC

## 2017-06-30 MED ORDER — MECLIZINE HCL 25 MG PO TABS: 25.0000 mg | ORAL_TABLET | Freq: Three times a day (TID) | ORAL | 0 refills | Status: DC | PRN

## 2017-06-30 NOTE — ED Provider Notes (Signed)
[facility]-EMERGENCY DEPT Provider Note   CSN: [id number] Arrival date & time: 06/30/17  2152     History   Chief Complaint Chief Complaint  Patient presents with  . Headache    HPI Mindy Wade is a 37 y.o. female.  HPI Patient states she woke with a bilateral headache wrapping around her temples and going into her neck.  Describes as pressure.  Denies any injury.  No fever or chills.  No photophobia, nausea or vomiting.  Patient also has nasal congestion and bilateral ear fullness.  Complains of occasional spinning sensation but currently denies this.  No focal weakness or numbness. Past Medical History:  Diagnosis Date  . Breast feeding status of mother   . Hx of blood clots   . Hx of varicella   . Hyperlipemia   . Hypertension   . Pregnancy induced hypertension     Patient Active Problem List   Diagnosis Date Noted  . Missed abortion with fetal demise before 20 completed weeks of gestation 05/03/2017  . Precipitous delivery 02/19/2015  . Postpartum care following vaginal delivery 02/19/2015  . Active labor at term 02/18/2015  . Pre-diabetes 05/14/2014  . Elevated LDL cholesterol level 05/14/2014  . Pulmonary embolism (HCC) 10/17/2013  . Labor and delivery indication for care or intervention 10/09/2013  . Spontaneous abortion in second trimester 10/09/2013  . SVD (spontaneous vaginal delivery) 03/02/2013    Past Surgical History:  Procedure Laterality Date  . CHOLECYSTECTOMY       OB History    Gravida  8   Para  7   Term  6   Preterm  1   AB  1   Living  6     SAB  1   TAB      Ectopic      Multiple  0   Live Births  7            Home Medications    Prior to Admission medications   Medication Sig Start Date End Date Taking? Authorizing Provider  acetaminophen-codeine (TYLENOL #3) 300-30 MG tablet Take 1-2 tablets by mouth every 6 (six) hours as needed for moderate pain. Patient not taking: Reported on  06/15/2017 05/03/17   Raelyn Mora, CNM  amLODipine (NORVASC) 10 MG tablet Take 10 mg by mouth daily.    [provider]  ferrous sulfate 325 (65 FE) MG tablet Take 1 tablet (325 mg total) by mouth daily. Patient not taking: Reported on 06/15/2017 05/24/17   Rolm Bookbinder, CNM  fluticasone [facility]) 50 MCG/ACT nasal spray Place 2 sprays into both nostrils daily for 7 days. 06/30/17 07/07/17  Loren Racer, MD  ibuprofen (ADVIL,MOTRIN) 800 MG tablet Take 1 tablet (800 mg total) by mouth every 6 (six) hours as needed. Patient not taking: Reported on 06/15/2017 05/24/17   Rolm Bookbinder, CNM  labetalol (NORMODYNE) 200 MG tablet Take 200 mg by mouth 2 (two) times daily. Reported on 05/02/2015    [provider]  loratadine (CLARITIN) 10 MG tablet Take 1 tablet (10 mg total) by mouth daily. 06/30/17   Loren Racer, MD  meclizine (ANTIVERT) 25 MG tablet Take 1 tablet (25 mg total) by mouth 3 (three) times daily as needed for dizziness. 06/30/17   Loren Racer, MD  methocarbamol (ROBAXIN) 500 MG tablet Take 2 tablets (1,000 mg total) by mouth every 6 (six) hours as needed for muscle spasms. 06/30/17   Loren Racer, MD  misoprostol (CYTOTEC) 200 MCG tablet  Place 4 tablets (800 mcg total) vaginally once for 1 dose. Patient not taking: Reported on 06/15/2017 05/03/17 06/15/25  Raelyn Mora, CNM  misoprostol (CYTOTEC) 200 MCG tablet Take 4 tablets (800 mcg total) by mouth once for 1 dose. Patient not taking: Reported on 06/15/2017 05/25/17 06/15/25  Rolm Bookbinder, CNM  NORLYDA 0.35 MG tablet TAKE 1 TABLET BY MOUTH DAILY 02/08/17   [provider]  promethazine (PHENERGAN) 12.5 MG tablet Take 1 tablet (12.5 mg total) by mouth every 4 (four) hours as needed for nausea or vomiting. Patient not taking: Reported on 06/15/2017 05/03/17   Raelyn Mora, CNM  rosuvastatin (CRESTOR) 10 MG tablet Take 10 mg by mouth at bedtime.     [provider]  spironolactone (ALDACTONE)  25 MG tablet Take 25 mg by mouth daily.    [provider]    Family History Family History  Problem Relation Age of Onset  . Asthma Mother   . Heart disease Mother   . Hypertension Mother   . Stroke Mother   . Cancer Father        prostate  . Alcohol abuse Neg Hx   . Arthritis Neg Hx   . Birth defects Neg Hx   . COPD Neg Hx   . Depression Neg Hx   . Diabetes Neg Hx   . Drug abuse Neg Hx   . Early death Neg Hx   . Hearing loss Neg Hx   . Hyperlipidemia Neg Hx   . Kidney disease Neg Hx   . Learning disabilities Neg Hx   . Mental illness Neg Hx   . Mental retardation Neg Hx   . Miscarriages / Stillbirths Neg Hx   . Vision loss Neg Hx   . Varicose Veins Neg Hx     Social History Social History   Tobacco Use  . Smoking status: Never Smoker  . Smokeless tobacco: Never Used  Substance Use Topics  . Alcohol use: No  . Drug use: No     Allergies   Penicillins   Review of Systems Review of Systems  Constitutional: Negative for chills and fever.  HENT: Positive for congestion. Negative for ear pain, facial swelling, rhinorrhea, sinus pain, sore throat and trouble swallowing.   Eyes: Negative for photophobia and visual disturbance.  Respiratory: Negative for shortness of breath.   Cardiovascular: Negative for chest pain, palpitations and leg swelling.  Gastrointestinal: Negative for abdominal pain, diarrhea, nausea and vomiting.  Genitourinary: Negative for dysuria, flank pain and frequency.  Musculoskeletal: Positive for myalgias and neck pain. Negative for arthralgias, back pain, gait problem and neck stiffness.  Skin: Negative for rash and wound.  Neurological: Positive for dizziness and headaches. Negative for speech difficulty, weakness and numbness.  Psychiatric/Behavioral: The patient is nervous/anxious.   All other systems reviewed and are negative.    Physical Exam Updated Vital Signs BP (!) 140/107 (BP Location: Left Arm)   Pulse 75   Temp  98.5 F (36.9 C) (Oral)   Resp 18   Ht  (1.727 m)   Wt 124.7 kg (275 lb)   LMP 06/12/2017   SpO2 100%   BMI 41.81 kg/m   Physical Exam  Constitutional: She is oriented to person, place, and time. She appears well-developed and well-nourished. No distress.  HENT:  Head: Normocephalic and atraumatic.  Mouth/Throat: Oropharynx is clear and moist.  Bilateral nasal mucosal edema.  Bilateral TMs with mild bulging.  No definite sinus tenderness to percussion over the  frontal maxillary sinuses.  Oropharynx is clear.  Patient does have tenderness to palpation over the temporalis and occipital muscles bilaterally.  Eyes: Pupils are equal, round, and reactive to light. EOM are normal.  No nystagmus  Neck: Normal range of motion. Neck supple.  Patient has mild tenderness to palpation over the left cervical paraspinal muscles.  No posterior midline cervical tenderness to palpation.  No meningismus.  No lymphadenopathy.  Cardiovascular: Normal rate and regular rhythm.  Pulmonary/Chest: Effort normal and breath sounds normal.  Abdominal: Soft. Bowel sounds are normal. There is no tenderness. There is no rebound and no guarding.  Musculoskeletal: Normal range of motion. She exhibits no edema or tenderness.  Lymphadenopathy:    She has no cervical adenopathy.  Neurological: She is alert and oriented to person, place, and time.  Patient is alert and oriented x3 with clear, goal oriented speech. Patient has 5/5 motor in all extremities. Sensation is intact to light touch. Bilateral finger-to-nose is normal with no signs of dysmetria. Patient has a normal gait and walks without assistance.  Skin: Skin is warm and dry. No rash noted. She is not diaphoretic. No erythema.  Psychiatric: Her behavior is normal.  Anxious  Nursing note and vitals reviewed.    ED Treatments / Results  Labs (all labs ordered are listed, but only abnormal results are displayed) Labs Reviewed - No data to  display  EKG None  Radiology Dg Chest 2 View  Result Date: 06/29/2017 CLINICAL DATA:  Chest pain EXAM: CHEST - 2 VIEW COMPARISON:  06/16/2017 chest radiograph. FINDINGS: Stable cardiomediastinal silhouette with normal heart size. No pneumothorax. No pleural effusion. Lungs appear clear, with no acute consolidative airspace disease and no pulmonary edema. IMPRESSION: No active cardiopulmonary disease. Electronically Signed   By: Delbert Phenix M.D.   On: 06/29/2017 16:05    Procedures Procedures (including critical care time)  Medications Ordered in ED Medications - No data to display   Initial Impression / Assessment and Plan / ED Course  I have reviewed the triage vital signs and the nursing notes.  Pertinent labs & imaging results that were available during my care of the patient were reviewed by me and considered in my medical decision making (see chart for details).     Patient had CT head about 2 weeks ago for similar sounding headache.  Demonstrated sinus disease.  Symptoms are likely multifactorial.  Patient has nasal congestion and fluid behind the TMs bilaterally.  Probably does have sinus pressure that is contributing to her headache.  Experienced spinning sensation consistent with vertigo.  She also states that since being diagnosed with a PE 4 years ago she becomes increasingly anxious.  She is no longer on anticoagulation.  Also states she has had increased stress at home.  She has tenderness to palpation over the musculature of the scalp.  This consistent with tension-like headache.  Low suspicion for encephalitis/meningitis.  Do not believe that further imaging is necessary.  Strict return precautions have been given.  Final Clinical Impressions(s) / ED Diagnoses   Final diagnoses:  Sinus disease  Tension headache  Peripheral vertigo, unspecified laterality    ED Discharge Orders        Ordered    loratadine (CLARITIN) 10 MG tablet  Daily     06/30/17 2231     fluticasone (FLONASE) 50 MCG/ACT nasal spray  Daily     06/30/17 2231    methocarbamol (ROBAXIN) 500 MG tablet  Every 6 hours PRN  06/30/17 2231    meclizine (ANTIVERT) 25 MG tablet  3 times daily PRN     06/30/17 2231       Loren Racer, MD 06/30/17 2248

## 2017-06-30 NOTE — ED Triage Notes (Addendum)
Pt reports experiencing increasingly worse headache starting today. Experienced episode of dizziness this morning upon waking up, but denies dizziness at this time. Tried tylenol/ibuprofen no relief. Denies chest pain, shortness of breath, N/V. A/Ox4.

## 2017-07-03 ENCOUNTER — Emergency Department (HOSPITAL_COMMUNITY): Payer: 59

## 2017-07-03 ENCOUNTER — Other Ambulatory Visit: Payer: Self-pay

## 2017-07-03 ENCOUNTER — Encounter (HOSPITAL_COMMUNITY): Payer: Self-pay | Admitting: Emergency Medicine

## 2017-07-03 ENCOUNTER — Emergency Department (HOSPITAL_COMMUNITY)
Admission: EM | Admit: 2017-07-03 | Discharge: 2017-07-03 | Disposition: A | Discharge status: Home / Self Care | Payer: 59 | Attending: Emergency Medicine | Admitting: Emergency Medicine

## 2017-07-03 DIAGNOSIS — Z79899 Other long term (current) drug therapy: Secondary | ICD-10-CM | POA: Insufficient documentation

## 2017-07-03 DIAGNOSIS — R079 Chest pain, unspecified: Secondary | ICD-10-CM | POA: Insufficient documentation

## 2017-07-03 DIAGNOSIS — I10 Essential (primary) hypertension: Secondary | ICD-10-CM | POA: Insufficient documentation

## 2017-07-03 LAB — BASIC METABOLIC PANEL
ANION GAP: 12 (ref 5–15)
BUN: 11 mg/dL (ref 6–20)
CO2: 19 mmol/L — AB (ref 22–32)
Calcium: 9 mg/dL (ref 8.9–10.3)
Chloride: 108 mmol/L (ref 101–111)
Creatinine, Ser: 0.75 mg/dL (ref 0.44–1.00)
GFR calc Af Amer: >60 mL/min (ref >60)
GLUCOSE: 103 mg/dL — AB (ref 65–99)
POTASSIUM: 3.8 mmol/L (ref 3.5–5.1)
Sodium: 139 mmol/L (ref 135–145)

## 2017-07-03 LAB — CBC
HEMATOCRIT: 37.3 % (ref 36.0–46.0)
HEMOGLOBIN: 12.1 g/dL (ref 12.0–15.0)
MCH: 29.8 pg (ref 26.0–34.0)
MCHC: 32.4 g/dL (ref 30.0–36.0)
MCV: 91.9 fL (ref 78.0–100.0)
Platelets: 329 10*3/uL (ref 150–400)
RBC: 4.06 MIL/uL (ref 3.87–5.11)
RDW: 13.7 % (ref 11.5–15.5)
WBC: 7.2 10*3/uL (ref 4.0–10.5)

## 2017-07-03 LAB — I-STAT BETA HCG BLOOD, ED (MC, WL, AP ONLY)

## 2017-07-03 LAB — D-DIMER, QUANTITATIVE: D-Dimer, Quant: 0.81 ug/mL-FEU — ABNORMAL HIGH (ref 0.00–0.50)

## 2017-07-03 LAB — I-STAT TROPONIN, ED
TROPONIN I, POC: 0.01 ng/mL (ref 0.00–0.08)
Troponin i, poc: 0.01 ng/mL (ref 0.00–0.08)

## 2017-07-03 MED ORDER — IOPAMIDOL (ISOVUE-370) INJECTION 76%
INTRAVENOUS | Status: AC
  Filled 2017-07-03: qty 100

## 2017-07-03 MED ORDER — IOPAMIDOL (ISOVUE-370) INJECTION 76%
100.0000 mL | Freq: Once | INTRAVENOUS | Status: AC | PRN
  Administered 2017-07-03: 100 mL via INTRAVENOUS

## 2017-07-03 MED ORDER — FAMOTIDINE 20 MG PO TABS: 20.0000 mg | ORAL_TABLET | Freq: Two times a day (BID) | ORAL | 0 refills | Status: DC

## 2017-07-03 NOTE — ED Notes (Signed)
Patient transported to CT 

## 2017-07-03 NOTE — ED Provider Notes (Signed)
[facility]-EMERGENCY DEPT Provider Note   CSN: [id number] Arrival date & time: 07/03/17  0746     History   Chief Complaint Chief Complaint  Patient presents with  . Chest Pain    HPI Corissa Oguinn is a 37 y.o. female  She has a prior history of PE hypertension hyperlipidemia here with chest burning that began last night and kept her up.  This morning she woke up with feeling her heart beating very strongly may be racing and some left-sided chest squeezing that radiated down her left arm.  This lasted up to an hour and is improving on arrival.  She states that she is been in triage she has had intermittent sharp pains that last a few seconds.  She is had multiple visits here recently for atypical chest pain and so far there is no etiology has been identified.  She had a blood clots in 2015 that they related to her having had a recent miscarriage at that time.  She is not on anticoagulation now.  She said she had a recent miscarriage in March and her last menstrual period was at the beginning of April.  She is awaiting an appointment with her OB in a couple weeks.   The history is provided by the patient.   Chest Pain    This is a new problem. The current episode started yesterday. The problem has been rapidly improving. The pain is associated with rest. The pain is present in the substernal region. The pain is moderate. The quality of the pain is described as burning, pressure-like and sharp. The pain radiates to the left arm. Associated symptoms include headaches and shortness of breath. Pertinent negatives include no abdominal pain, no back pain, no cough, no diaphoresis, no fever, no leg pain, no lower extremity edema, no numbness, no palpitations and no vomiting. Nausea: 1.  She has tried nothing for the symptoms. The treatment provided moderate relief. There are no known risk factors.  Her past medical history is significant for hyperlipidemia and hypertension.    Pertinent negatives for past medical history include no seizures.  Her family medical history is significant for CAD and stroke.    Past Medical History:  Diagnosis Date  . Breast feeding status of mother   . Hx of blood clots   . Hx of varicella   . Hyperlipemia   . Hypertension   . Pregnancy induced hypertension     Patient Active Problem List   Diagnosis Date Noted  . Missed abortion with fetal demise before 20 completed weeks of gestation 05/03/2017  . Precipitous delivery 02/19/2015  . Postpartum care following vaginal delivery 02/19/2015  . Active labor at term 02/18/2015  . Pre-diabetes 05/14/2014  . Elevated LDL cholesterol level 05/14/2014  . Pulmonary embolism (HCC) 10/17/2013  . Labor and delivery indication for care or intervention 10/09/2013  . Spontaneous abortion in second trimester 10/09/2013  . SVD (spontaneous vaginal delivery) 03/02/2013    Past Surgical History:  Procedure Laterality Date  . CHOLECYSTECTOMY       OB History    Gravida  8   Para  7   Term  6   Preterm  1   AB  1   Living  6     SAB  1   TAB      Ectopic      Multiple  0   Live Births  7  Home Medications    Prior to Admission medications   Medication Sig Start Date End Date Taking? Authorizing Provider  amLODipine (NORVASC) 10 MG tablet Take 10 mg by mouth daily.   Yes [provider]  azithromycin (ZITHROMAX Z-PAK) 250 MG tablet 2 tablets today, 1 tablet day 2-5 07/01/17  Yes [provider]  fluticasone (FLONASE) 50 MCG/ACT nasal spray Place 2 sprays into both nostrils daily for 7 days. 06/30/17 07/07/17 Yes Loren Racer, MD  labetalol (NORMODYNE) 200 MG tablet Take 200 mg by mouth 2 (two) times daily. Reported on 05/02/2015   Yes [provider]  loratadine (CLARITIN) 10 MG tablet Take 1 tablet (10 mg total) by mouth daily. 06/30/17  Yes Loren Racer, MD  methocarbamol (ROBAXIN) 500 MG tablet Take 2 tablets (1,000 mg  total) by mouth every 6 (six) hours as needed for muscle spasms. 06/30/17  Yes Loren Racer, MD  NORLYDA 0.35 MG tablet TAKE 1 TABLET BY MOUTH DAILY 02/08/17  Yes [provider]  rosuvastatin (CRESTOR) 10 MG tablet Take 10 mg by mouth at bedtime.    Yes [provider]  spironolactone (ALDACTONE) 25 MG tablet Take 25 mg by mouth daily.   Yes [provider]  acetaminophen-codeine (TYLENOL #3) 300-30 MG tablet Take 1-2 tablets by mouth every 6 (six) hours as needed for moderate pain. Patient not taking: Reported on 06/15/2017 05/03/17   Raelyn Mora, CNM  ferrous sulfate 325 (65 FE) MG tablet Take 1 tablet (325 mg total) by mouth daily. Patient not taking: Reported on 06/15/2017 05/24/17   Rolm Bookbinder, CNM  ibuprofen (ADVIL,MOTRIN) 800 MG tablet Take 1 tablet (800 mg total) by mouth every 6 (six) hours as needed. Patient not taking: Reported on 06/15/2017 05/24/17   Rolm Bookbinder, CNM  meclizine (ANTIVERT) 25 MG tablet Take 1 tablet (25 mg total) by mouth 3 (three) times daily as needed for dizziness. 06/30/17   Loren Racer, MD  misoprostol (CYTOTEC) 200 MCG tablet Place 4 tablets (800 mcg total) vaginally once for 1 dose. Patient not taking: Reported on 06/15/2017 05/03/17 06/15/25  Raelyn Mora, CNM  misoprostol (CYTOTEC) 200 MCG tablet Take 4 tablets (800 mcg total) by mouth once for 1 dose. Patient not taking: Reported on 06/15/2017 05/25/17 06/15/25  Rolm Bookbinder, CNM  promethazine (PHENERGAN) 12.5 MG tablet Take 1 tablet (12.5 mg total) by mouth every 4 (four) hours as needed for nausea or vomiting. Patient not taking: Reported on 06/15/2017 05/03/17   Raelyn Mora, CNM    Family History Family History  Problem Relation Age of Onset  . Asthma Mother   . Heart disease Mother   . Hypertension Mother   . Stroke Mother   . Cancer Father        prostate  . Alcohol abuse Neg Hx   . Arthritis Neg Hx   . Birth defects Neg Hx   . COPD Neg Hx   .  Depression Neg Hx   . Diabetes Neg Hx   . Drug abuse Neg Hx   . Early death Neg Hx   . Hearing loss Neg Hx   . Hyperlipidemia Neg Hx   . Kidney disease Neg Hx   . Learning disabilities Neg Hx   . Mental illness Neg Hx   . Mental retardation Neg Hx   . Miscarriages / Stillbirths Neg Hx   . Vision loss Neg Hx   . Varicose Veins Neg Hx     Social History Social History  Tobacco Use  . Smoking status: Never Smoker  . Smokeless tobacco: Never Used  Substance Use Topics  . Alcohol use: No  . Drug use: No     Allergies   Penicillins   Review of Systems Review of Systems  Constitutional: Negative for chills, diaphoresis and fever.  HENT: Negative for ear pain and sore throat.   Eyes: Negative for pain and visual disturbance.  Respiratory: Positive for shortness of breath. Negative for cough.   Cardiovascular: Positive for chest pain. Negative for palpitations.  Gastrointestinal: Negative for abdominal pain and vomiting. Nausea: 1.  Genitourinary: Negative for dysuria and hematuria.  Musculoskeletal: Negative for arthralgias and back pain.  Skin: Negative for color change and rash.  Neurological: Positive for headaches. Negative for seizures, syncope and numbness.  All other systems reviewed and are negative.    Physical Exam Updated Vital Signs BP (!) 126/91 (BP Location: Left Arm)   Pulse 86   Temp 98.3 F (36.8 C) (Oral)   Resp 12   Ht  (1.727 m)   Wt 122 kg (269 lb)   LMP 06/12/2017   SpO2 99%   BMI 40.90 kg/m    Physical Exam  Constitutional: She appears well-developed and well-nourished. No distress.  HENT:  Head: Normocephalic and atraumatic.  Eyes: Conjunctivae are normal.  Neck: Neck supple.  Cardiovascular: Normal rate and regular rhythm.  No murmur heard. Pulmonary/Chest: Effort normal and breath sounds normal. No respiratory distress.  Abdominal: Soft. There is no tenderness.  Musculoskeletal: She exhibits no edema.       Right lower  leg: Normal. She exhibits no tenderness and no edema.       Left lower leg: Normal. She exhibits no tenderness and no edema.  Neurological: She is alert.  Skin: Skin is warm and dry. Capillary refill takes less than 2 seconds.  Psychiatric: She has a normal mood and affect.  Nursing note and vitals reviewed.    ED Treatments / Results  Labs (all labs ordered are listed, but only abnormal results are displayed) Labs Reviewed  BASIC METABOLIC PANEL - Abnormal; Notable for the following components:      Result Value   CO2 19 (*)    Glucose, Bld 103 (*)    All other components within normal limits  D-DIMER, QUANTITATIVE (NOT AT [facility]) - Abnormal; Notable for the following components:   D-Dimer, Quant 0.81 (*)    All other components within normal limits  CBC  I-STAT TROPONIN, ED  I-STAT BETA HCG BLOOD, ED ([facility], [facility], [facility] ONLY)  I-STAT TROPONIN, ED    EKG EKG Interpretation  Date/Time:  Wednesday Jul 03 2017 07:55:19 EDT Ventricular Rate:  84 PR Interval:    QRS Duration: 92 QT Interval:  353 QTC Calculation: 418 R Axis:   62 Text Interpretation:  Sinus rhythm Baseline wander in lead(s) V6 since last tracing no significant change Confirmed by Mancel Bale [phone]) on 07/03/2017 7:59:30 AM   Radiology Dg Chest 2 View  Result Date: 07/03/2017 CLINICAL DATA:  Mid chest pain EXAM: CHEST - 2 VIEW COMPARISON:  06/29/2017 FINDINGS: The heart size and mediastinal contours are within normal limits. Both lungs are clear. The visualized skeletal structures are unremarkable. IMPRESSION: No active cardiopulmonary disease. Electronically Signed   By: Elige Ko   On: 07/03/2017 08:49   Ct Angio Chest Pe W/cm &/or Wo Cm  Result Date: 07/03/2017 CLINICAL DATA:  Tachycardia, left chest pain, concern for pulmonary embolus. Positive D-dimer EXAM: CT ANGIOGRAPHY  CHEST WITH CONTRAST TECHNIQUE: Multidetector CT imaging of the chest was performed using the standard protocol during bolus administration  of intravenous contrast. Multiplanar CT image reconstructions and MIPs were obtained to evaluate the vascular anatomy. CONTRAST:  ISOVUE-370 IOPAMIDOL (ISOVUE-370) INJECTION 76% COMPARISON:  06/17/2017 FINDINGS: Cardiovascular: Pulmonary arteries are well-visualized and appear patent. No significant filling defect or pulmonary embolus demonstrated by CTA. Normal heart size. No pericardial effusion. Intact aorta without dissection or aneurysm. No mediastinal hemorrhage or hematoma. Two vessel arch anatomy appearing patent. Mediastinum/Nodes: No enlarged mediastinal, hilar, or axillary lymph nodes. Thyroid gland, trachea, and esophagus demonstrate no significant findings. Lungs/Pleura: Minor respiratory motion artifact and basilar atelectasis. No focal pneumonia, collapse or consolidation. Negative for edema or interstitial disease. No pleural abnormality, effusion or pneumothorax. Trachea and central airways are patent. Upper Abdomen: No acute abnormality.  Previous cholecystectomy. Musculoskeletal: No chest wall abnormality. No acute or significant osseous findings. Review of the MIP images confirms the above findings. IMPRESSION: Negative for significant acute pulmonary embolus by CTA. No acute intrathoracic process. Minor basilar atelectasis. Electronically Signed   By: Judie Petit.  Shick M.D.   On: 07/03/2017 11:33    Procedures Procedures (including critical care time)  Medications Ordered in ED Medications - No data to display   Initial Impression / Assessment and Plan / ED Course  I have reviewed the triage vital signs and the nursing notes.  Pertinent labs & imaging results that were available during my care of the patient were reviewed by me and considered in my medical decision making (see chart for details).  Clinical Course as of Jul 05 1523  Wed Jul 03, 2017  [id number] 37 year old female with no significant cardiac history here with chest burning last night and then palpitations and chest  tightness today.  Symptoms have improved since arrival.  She is a history of what likely is a provoked PE 4 years ago and is not on anticoagulation.  She has had multiple recent ED visits for similar chest discomfort.  She is not hypoxic or tachypneic not tachycardic.  Her initial troponin is negative and EKG is unremarkable.  I am getting a d-dimer   [MB]  0952 Heart score 2   [MB]  1137 CT negative for PE.  Will get second troponin and likely be discharged if negative.   [MB]    Clinical Course User Index [MB] Terrilee Files, MD     Final Clinical Impressions(s) / ED Diagnoses   Final diagnoses:  Chest pain in adult    ED Discharge Orders    None      Terrilee Files, MD 07/04/17 1526

## 2017-07-03 NOTE — ED Triage Notes (Signed)
Pt verbalizes awoke from sleep with heart racing and left side chest squeezing radiating down left arm.

## 2017-07-03 NOTE — Discharge Instructions (Addendum)
Your evaluated in the emergency department for chest pain and palpitations.  You had blood work and EKG and a CAT scan of your chest that did not show an obvious cause of your symptoms.  There is no sign of any heart injury and no sign of a blood clot.  There are other possible causes of chest pain that are less serious and we are going to start you on some acid medication in case acid reflux is 1 of these causes.  It is important that you follow-up with your primary care doctor for further evaluation.

## 2017-07-06 ENCOUNTER — Emergency Department (HOSPITAL_COMMUNITY): Payer: 59

## 2017-07-06 ENCOUNTER — Encounter (HOSPITAL_COMMUNITY): Payer: Self-pay | Admitting: Emergency Medicine

## 2017-07-06 ENCOUNTER — Emergency Department (HOSPITAL_COMMUNITY)
Admission: EM | Admit: 2017-07-06 | Discharge: 2017-07-06 | Disposition: A | Discharge status: Home / Self Care | Payer: 59 | Attending: Emergency Medicine | Admitting: Emergency Medicine

## 2017-07-06 ENCOUNTER — Other Ambulatory Visit: Payer: Self-pay

## 2017-07-06 DIAGNOSIS — Z86711 Personal history of pulmonary embolism: Secondary | ICD-10-CM | POA: Diagnosis not present

## 2017-07-06 DIAGNOSIS — R0789 Other chest pain: Secondary | ICD-10-CM | POA: Diagnosis not present

## 2017-07-06 DIAGNOSIS — K219 Gastro-esophageal reflux disease without esophagitis: Secondary | ICD-10-CM | POA: Insufficient documentation

## 2017-07-06 DIAGNOSIS — I10 Essential (primary) hypertension: Secondary | ICD-10-CM | POA: Diagnosis not present

## 2017-07-06 DIAGNOSIS — Z79899 Other long term (current) drug therapy: Secondary | ICD-10-CM | POA: Diagnosis not present

## 2017-07-06 HISTORY — DX: Other pulmonary embolism without acute cor pulmonale: I26.99

## 2017-07-06 LAB — BASIC METABOLIC PANEL
ANION GAP: 8 (ref 5–15)
BUN: 7 mg/dL (ref 6–20)
CALCIUM: 9 mg/dL (ref 8.9–10.3)
CO2: 25 mmol/L (ref 22–32)
Chloride: 108 mmol/L (ref 101–111)
Creatinine, Ser: 0.71 mg/dL (ref 0.44–1.00)
Glucose, Bld: 101 mg/dL — ABNORMAL HIGH (ref 65–99)
POTASSIUM: 3.8 mmol/L (ref 3.5–5.1)
SODIUM: 141 mmol/L (ref 135–145)

## 2017-07-06 LAB — I-STAT TROPONIN, ED
Troponin i, poc: 0.02 ng/mL (ref 0.00–0.08)
Troponin i, poc: 0 ng/mL (ref 0.00–0.08)

## 2017-07-06 LAB — CBC
HEMATOCRIT: 36.1 % (ref 36.0–46.0)
Hemoglobin: 11.7 g/dL — ABNORMAL LOW (ref 12.0–15.0)
MCH: 29.7 pg (ref 26.0–34.0)
MCHC: 32.4 g/dL (ref 30.0–36.0)
MCV: 91.6 fL (ref 78.0–100.0)
Platelets: 303 10*3/uL (ref 150–400)
RBC: 3.94 MIL/uL (ref 3.87–5.11)
RDW: 13.6 % (ref 11.5–15.5)
WBC: 7.3 10*3/uL (ref 4.0–10.5)

## 2017-07-06 LAB — I-STAT BETA HCG BLOOD, ED (MC, WL, AP ONLY)

## 2017-07-06 MED ORDER — IOPAMIDOL (ISOVUE-370) INJECTION 76%
INTRAVENOUS | Status: AC
  Filled 2017-07-06: qty 100

## 2017-07-06 MED ORDER — IOPAMIDOL (ISOVUE-370) INJECTION 76%
100.0000 mL | Freq: Once | INTRAVENOUS | Status: AC | PRN
  Administered 2017-07-06: 100 mL via INTRAVENOUS

## 2017-07-06 MED ORDER — GI COCKTAIL ~~LOC~~
30.0000 mL | Freq: Once | ORAL | Status: AC
  Administered 2017-07-06: 30 mL via ORAL
  Filled 2017-07-06: qty 30

## 2017-07-06 NOTE — ED Notes (Signed)
Patient transported to CT 

## 2017-07-06 NOTE — ED Notes (Signed)
Got patient undress on the monitor got patient a warm blanket call bell in reach

## 2017-07-06 NOTE — ED Triage Notes (Signed)
Woke up 45 min ago with burning in center of chest and sob.  Denies nausea and vomiting.

## 2017-07-06 NOTE — ED Notes (Signed)
Patient verbalized understanding of discharge instructions and denies any further needs or questions at this time. VS stable. Patient ambulatory with steady gait.  

## 2017-07-06 NOTE — Discharge Instructions (Addendum)
Continue to take Pepcid.

## 2017-07-06 NOTE — ED Provider Notes (Addendum)
[facility]   CSN: [id number] Arrival date & time: 07/06/17  0152     History   Chief Complaint Chief Complaint  Patient presents with  . Chest Pain    HPI Mindy Wade is a 37 y.o. female.  Pt presents to the ED today with burning CP.  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  She had her PE in 2015 associated with a pregnancy that miscarried.  She was on blood thinners for 6 months and is not on them now.  She did have a CT chest in April that showed a small hiatal hernia, but she said no one explained what that was.     Past Medical History:  Diagnosis Date  . Breast feeding status of mother   . Hx of blood clots   . Hx of varicella   . Hyperlipemia   . Hypertension   . Pregnancy induced hypertension   . Pulmonary embolism [facility])     Patient Active Problem List   Diagnosis Date Noted  . Missed abortion with fetal demise before 20 completed weeks of gestation 05/03/2017  . Precipitous delivery 02/19/2015  . Postpartum care following vaginal delivery 02/19/2015  . Active labor at term 02/18/2015  . Pre-diabetes 05/14/2014  . Elevated LDL cholesterol level 05/14/2014  . Pulmonary embolism (HCC) 10/17/2013  . Labor and delivery indication for care or intervention 10/09/2013  . Spontaneous abortion in second trimester 10/09/2013  . SVD (spontaneous vaginal delivery) 03/02/2013    Past Surgical History:  Procedure Laterality Date  . CHOLECYSTECTOMY       OB History    Gravida  8   Para  7   Term  6   Preterm  1   AB  1   Living  6     SAB  1   TAB      Ectopic      Multiple  0   Live Births  7            Home Medications    Prior to Admission medications   Medication Sig Start Date End Date Taking? Authorizing Provider  amLODipine (NORVASC) 10 MG tablet Take 10 mg by mouth daily.   Yes [provider]  famotidine (PEPCID) 20 MG tablet Take 1  tablet (20 mg total) by mouth 2 (two) times daily. Patient taking differently: Take 20 mg by mouth 2 (two) times daily as needed for heartburn.  07/03/17  Yes Terrilee Files, MD  labetalol (NORMODYNE) 200 MG tablet Take 200 mg by mouth 2 (two) times daily. Reported on 05/02/2015   Yes [provider]  loratadine (CLARITIN) 10 MG tablet Take 1 tablet (10 mg total) by mouth daily. 06/30/17  Yes Loren Racer, MD  methocarbamol (ROBAXIN) 500 MG tablet Take 2 tablets (1,000 mg total) by mouth every 6 (six) hours as needed for muscle spasms. 06/30/17  Yes Loren Racer, MD  NORLYDA 0.35 MG tablet TAKE 1 TABLET BY MOUTH DAILY 02/08/17  Yes [provider]  rosuvastatin (CRESTOR) 10 MG tablet Take 10 mg by mouth at bedtime.    Yes [provider]  spironolactone (ALDACTONE) 25 MG tablet Take 25 mg by mouth daily.   Yes [provider]  acetaminophen-codeine (TYLENOL #3) 300-30 MG tablet Take 1-2 tablets by mouth every 6 (six) hours as needed for moderate pain. Patient not taking: Reported on 06/15/2017 05/03/17  Raelyn Mora, CNM  ferrous sulfate 325 (65 FE) MG tablet Take 1 tablet (325 mg total) by mouth daily. Patient not taking: Reported on 06/15/2017 05/24/17   Rolm Bookbinder, CNM  hydrOXYzine (ATARAX/VISTARIL) 25 MG tablet Take 1 tablet (25 mg total) by mouth every 6 (six) hours as needed for anxiety. 07/08/17   Robinson, [place] N, PA-C  ibuprofen (ADVIL,MOTRIN) 800 MG tablet Take 1 tablet (800 mg total) by mouth every 6 (six) hours as needed. Patient not taking: Reported on 06/15/2017 05/24/17   Rolm Bookbinder, CNM  meclizine (ANTIVERT) 25 MG tablet Take 1 tablet (25 mg total) by mouth 3 (three) times daily as needed for dizziness. Patient not taking: Reported on 07/03/2017 06/30/17   Loren Racer, MD  misoprostol (CYTOTEC) 200 MCG tablet Place 4 tablets (800 mcg total) vaginally once for 1 dose. Patient not taking: Reported on 06/15/2017 05/03/17 06/15/25  Raelyn Mora, CNM  misoprostol (CYTOTEC) 200 MCG tablet Take 4 tablets (800 mcg total) by mouth once for 1 dose. Patient not taking: Reported on 06/15/2017 05/25/17 06/15/25  Rolm Bookbinder, CNM  promethazine (PHENERGAN) 12.5 MG tablet Take 1 tablet (12.5 mg total) by mouth every 4 (four) hours as needed for nausea or vomiting. Patient not taking: Reported on 06/15/2017 05/03/17   Raelyn Mora, CNM    Family History Family History  Problem Relation Age of Onset  . Asthma Mother   . Heart disease Mother   . Hypertension Mother   . Stroke Mother   . Cancer Father        prostate  . Alcohol abuse Neg Hx   . Arthritis Neg Hx   . Birth defects Neg Hx   . COPD Neg Hx   . Depression Neg Hx   . Diabetes Neg Hx   . Drug abuse Neg Hx   . Early death Neg Hx   . Hearing loss Neg Hx   . Hyperlipidemia Neg Hx   . Kidney disease Neg Hx   . Learning disabilities Neg Hx   . Mental illness Neg Hx   . Mental retardation Neg Hx   . Miscarriages / Stillbirths Neg Hx   . Vision loss Neg Hx   . Varicose Veins Neg Hx     Social History Social History   Tobacco Use  . Smoking status: Never Smoker  . Smokeless tobacco: Never Used  Substance Use Topics  . Alcohol use: No  . Drug use: No     Allergies   Penicillins   Review of Systems Review of Systems  Cardiovascular: Positive for chest pain.  All other systems reviewed and are negative.    Physical Exam Updated Vital Signs BP 125/86   Pulse (!) 59   Temp 98.9 F (37.2 C) (Oral)   Resp 13   LMP 06/12/2017   SpO2 100%   Physical Exam  Constitutional: She is oriented to person, place, and time. She appears well-developed and well-nourished.  HENT:  Head: Normocephalic and atraumatic.  Eyes: Pupils are equal, round, and reactive to light. EOM are normal.  Neck: Normal range of motion. Neck supple.  Cardiovascular: Normal rate, regular rhythm, intact distal pulses and normal pulses.  Pulmonary/Chest: Effort normal and breath  sounds normal.  Abdominal: Soft. Bowel sounds are normal.  Musculoskeletal: Normal range of motion.       Right lower leg: Normal.       Left lower leg: Normal.  Neurological: She is alert and oriented to person, place,  and time.  Skin: Skin is warm and dry. Capillary refill takes less than 2 seconds.  Psychiatric: She has a normal mood and affect. Her behavior is normal.  Nursing note and vitals reviewed.    ED Treatments / Results  Labs (all labs ordered are listed, but only abnormal results are displayed) Labs Reviewed  BASIC METABOLIC PANEL - Abnormal; Notable for the following components:      Result Value   Glucose, Bld 101 (*)    All other components within normal limits  CBC - Abnormal; Notable for the following components:   Hemoglobin 11.7 (*)    All other components within normal limits  I-STAT TROPONIN, ED  I-STAT BETA HCG BLOOD, ED ([facility], [facility], [facility] ONLY)  I-STAT BETA HCG BLOOD, ED ([facility], [facility], [facility] ONLY)  I-STAT TROPONIN, ED    EKG EKG Interpretation  Date/Time:  Saturday Jul 06 2017 10:28:25 EDT Ventricular Rate:  69 PR Interval:  148 QRS Duration: 96 QT Interval:  383 QTC Calculation: 411 R Axis:   74 Text Interpretation:  Sinus rhythm Confirmed by Jacalyn Lefevre [phone]) on 07/06/2017 10:33:23 AM   Radiology No results found.  Procedures Procedures (including critical care time)  Medications Ordered in ED Medications  gi cocktail (Maalox,Lidocaine,Donnatal) (30 mLs Oral Given 07/06/17 1119)  iopamidol (ISOVUE-370) 76 % injection 100 mL (100 mLs Intravenous Contrast Given 07/06/17 1225)     Initial Impression / Assessment and Plan / ED Course  I have reviewed the triage vital signs and the nursing notes.  Pertinent labs & imaging results that were available during my care of the patient were reviewed by me and considered in my medical decision making (see chart for details).  Heart score of 1 with 2 negative troponins over 7 hours apart.  2 negative  EKGs.  Pt is feeling much better after GI cocktail.  Sx likely GI related.  She is instructed to return if worse and to f/u with pcp.     Final Clinical Impressions(s) / ED Diagnoses   Final diagnoses:  Atypical chest pain  Gastroesophageal reflux disease, esophagitis presence not specified    ED Discharge Orders    None       Jacalyn Lefevre, MD 07/06/17 1308    Jacalyn Lefevre, MD 07/18/17 2033

## 2017-07-07 DIAGNOSIS — Z86711 Personal history of pulmonary embolism: Secondary | ICD-10-CM | POA: Diagnosis not present

## 2017-07-07 DIAGNOSIS — I10 Essential (primary) hypertension: Secondary | ICD-10-CM | POA: Insufficient documentation

## 2017-07-07 DIAGNOSIS — Z79899 Other long term (current) drug therapy: Secondary | ICD-10-CM | POA: Insufficient documentation

## 2017-07-07 DIAGNOSIS — R0789 Other chest pain: Secondary | ICD-10-CM | POA: Insufficient documentation

## 2017-07-07 DIAGNOSIS — F419 Anxiety disorder, unspecified: Secondary | ICD-10-CM | POA: Diagnosis not present

## 2017-07-08 ENCOUNTER — Encounter (HOSPITAL_COMMUNITY): Payer: Self-pay | Admitting: Emergency Medicine

## 2017-07-08 ENCOUNTER — Other Ambulatory Visit: Payer: Self-pay

## 2017-07-08 ENCOUNTER — Emergency Department (HOSPITAL_COMMUNITY)
Admission: EM | Admit: 2017-07-08 | Discharge: 2017-07-08 | Disposition: A | Discharge status: Home / Self Care | Payer: 59 | Attending: Emergency Medicine | Admitting: Emergency Medicine

## 2017-07-08 ENCOUNTER — Emergency Department (HOSPITAL_COMMUNITY): Payer: 59

## 2017-07-08 DIAGNOSIS — R079 Chest pain, unspecified: Secondary | ICD-10-CM

## 2017-07-08 DIAGNOSIS — F419 Anxiety disorder, unspecified: Secondary | ICD-10-CM

## 2017-07-08 LAB — I-STAT BETA HCG BLOOD, ED (MC, WL, AP ONLY): I-stat hCG, quantitative: <5 m[IU]/mL (ref <5)

## 2017-07-08 LAB — D-DIMER, QUANTITATIVE: D-Dimer, Quant: 0.72 ug/mL-FEU — ABNORMAL HIGH (ref 0.00–0.50)

## 2017-07-08 LAB — CBC
HCT: 35.3 % — ABNORMAL LOW (ref 36.0–46.0)
Hemoglobin: 11.6 g/dL — ABNORMAL LOW (ref 12.0–15.0)
MCH: 30 pg (ref 26.0–34.0)
MCHC: 32.9 g/dL (ref 30.0–36.0)
MCV: 91.2 fL (ref 78.0–100.0)
PLATELETS: 328 10*3/uL (ref 150–400)
RBC: 3.87 MIL/uL (ref 3.87–5.11)
RDW: 13.8 % (ref 11.5–15.5)
WBC: 7.6 10*3/uL (ref 4.0–10.5)

## 2017-07-08 LAB — BASIC METABOLIC PANEL
ANION GAP: 12 (ref 5–15)
BUN: 9 mg/dL (ref 6–20)
CALCIUM: 9.4 mg/dL (ref 8.9–10.3)
CO2: 21 mmol/L — AB (ref 22–32)
CREATININE: 0.73 mg/dL (ref 0.44–1.00)
Chloride: 107 mmol/L (ref 101–111)
Glucose, Bld: 100 mg/dL — ABNORMAL HIGH (ref 65–99)
Potassium: 3.5 mmol/L (ref 3.5–5.1)
Sodium: 140 mmol/L (ref 135–145)

## 2017-07-08 LAB — I-STAT TROPONIN, ED: TROPONIN I, POC: 0.01 ng/mL (ref 0.00–0.08)

## 2017-07-08 MED ORDER — HYDROXYZINE HCL 25 MG PO TABS: 25.0000 mg | ORAL_TABLET | Freq: Four times a day (QID) | ORAL | 0 refills | Status: DC | PRN

## 2017-07-08 NOTE — ED Triage Notes (Signed)
Pt reports having chest pain that started around 9pm and felt like a sharp pain then tightness.

## 2017-07-08 NOTE — Discharge Instructions (Addendum)
Please read instructions below. You can take ibuprofen every 6 hours as needed for pain. You can take hydroxyzine every 6 hours as needed for anxiety. Do not drive while taking this medication. Schedule an appointment with your primary care provider to follow up on your ER visits. Return to the ER for new or concerning symptoms.

## 2017-07-08 NOTE — ED Provider Notes (Signed)
[facility]-EMERGENCY DEPT Provider Note   CSN: [id number] Arrival date & time: 07/07/17  2145     History   Chief Complaint Chief Complaint  Patient presents with  . Chest Pain    HPI  Mindy Wade is a 37 y.o. female with past medical history of hypertension, PE, presenting to the ED for subsequent visit with intermittent pain x 2 weeks at rest.  She reports today stating that her chest pain comes at rest, and has been changing in quality which is been causing her to be nervous about the etiology of the pain.  She states that has been sometimes throbbing, sometimes dull, sometimes sharp, sometimes pressure, and sometimes burning.  Pain lasts a few seconds in duration.  Not associated with diaphoresis, nausea, shortness of breath.  States the symptoms do not feel similar to her history of PE which occurred in 2015.  Reports she has been feeling anxious regarding the cause of her chest pains.  She did follow-up with her PCP last week on Wednesday or Thursday.  She is taking ibuprofen and Tylenol for pain with relief.  Chest pain is not associated with cough, is not pleuritic.  No lower extremity pain or swelling.  LMP 06/12/2017.  Not on OCPs.  Per chart review, Patient has been seen in the ED 7 times since June 15, 2017. Today is her 5th visit with complaint of CP. She has had multiple imaging of her chest, including 4 neg chest x-rays, and 3 neg CTA of her chest within this time period. Negative cardiac workups.   The history is provided by the patient and medical records.    Past Medical History:  Diagnosis Date  . Breast feeding status of mother   . Hx of blood clots   . Hx of varicella   . Hyperlipemia   . Hypertension   . Pregnancy induced hypertension   . Pulmonary embolism [facility])     Patient Active Problem List   Diagnosis Date Noted  . Missed abortion with fetal demise before 20 completed weeks of gestation 05/03/2017  . Precipitous delivery  02/19/2015  . Postpartum care following vaginal delivery 02/19/2015  . Active labor at term 02/18/2015  . Pre-diabetes 05/14/2014  . Elevated LDL cholesterol level 05/14/2014  . Pulmonary embolism (HCC) 10/17/2013  . Labor and delivery indication for care or intervention 10/09/2013  . Spontaneous abortion in second trimester 10/09/2013  . SVD (spontaneous vaginal delivery) 03/02/2013    Past Surgical History:  Procedure Laterality Date  . CHOLECYSTECTOMY       OB History    Gravida  8   Para  7   Term  6   Preterm  1   AB  1   Living  6     SAB  1   TAB      Ectopic      Multiple  0   Live Births  7            Home Medications    Prior to Admission medications   Medication Sig Start Date End Date Taking? Authorizing Provider  amLODipine (NORVASC) 10 MG tablet Take 10 mg by mouth daily.   Yes [provider]  famotidine (PEPCID) 20 MG tablet Take 1 tablet (20 mg total) by mouth 2 (two) times daily. Patient taking differently: Take 20 mg by mouth 2 (two) times daily as needed for heartburn.  07/03/17  Yes Terrilee Files, MD  labetalol (NORMODYNE) 200 MG  tablet Take 200 mg by mouth 2 (two) times daily. Reported on 05/02/2015   Yes [provider]  loratadine (CLARITIN) 10 MG tablet Take 1 tablet (10 mg total) by mouth daily. 06/30/17  Yes Loren Racer, MD  methocarbamol (ROBAXIN) 500 MG tablet Take 2 tablets (1,000 mg total) by mouth every 6 (six) hours as needed for muscle spasms. 06/30/17  Yes Loren Racer, MD  NORLYDA 0.35 MG tablet TAKE 1 TABLET BY MOUTH DAILY 02/08/17  Yes [provider]  rosuvastatin (CRESTOR) 10 MG tablet Take 10 mg by mouth at bedtime.    Yes [provider]  spironolactone (ALDACTONE) 25 MG tablet Take 25 mg by mouth daily.   Yes [provider]  acetaminophen-codeine (TYLENOL #3) 300-30 MG tablet Take 1-2 tablets by mouth every 6 (six) hours as needed for moderate pain. Patient not  taking: Reported on 06/15/2017 05/03/17   Raelyn Mora, CNM  ferrous sulfate 325 (65 FE) MG tablet Take 1 tablet (325 mg total) by mouth daily. Patient not taking: Reported on 06/15/2017 05/24/17   Rolm Bookbinder, CNM  hydrOXYzine (ATARAX/VISTARIL) 25 MG tablet Take 1 tablet (25 mg total) by mouth every 6 (six) hours as needed for anxiety. 07/08/17   Tedrick Port, [place] N, PA-C  ibuprofen (ADVIL,MOTRIN) 800 MG tablet Take 1 tablet (800 mg total) by mouth every 6 (six) hours as needed. Patient not taking: Reported on 06/15/2017 05/24/17   Rolm Bookbinder, CNM  meclizine (ANTIVERT) 25 MG tablet Take 1 tablet (25 mg total) by mouth 3 (three) times daily as needed for dizziness. Patient not taking: Reported on 07/03/2017 06/30/17   Loren Racer, MD  misoprostol (CYTOTEC) 200 MCG tablet Place 4 tablets (800 mcg total) vaginally once for 1 dose. Patient not taking: Reported on 06/15/2017 05/03/17 06/15/25  Raelyn Mora, CNM  misoprostol (CYTOTEC) 200 MCG tablet Take 4 tablets (800 mcg total) by mouth once for 1 dose. Patient not taking: Reported on 06/15/2017 05/25/17 06/15/25  Rolm Bookbinder, CNM  promethazine (PHENERGAN) 12.5 MG tablet Take 1 tablet (12.5 mg total) by mouth every 4 (four) hours as needed for nausea or vomiting. Patient not taking: Reported on 06/15/2017 05/03/17   Raelyn Mora, CNM    Family History Family History  Problem Relation Age of Onset  . Asthma Mother   . Heart disease Mother   . Hypertension Mother   . Stroke Mother   . Cancer Father        prostate  . Alcohol abuse Neg Hx   . Arthritis Neg Hx   . Birth defects Neg Hx   . COPD Neg Hx   . Depression Neg Hx   . Diabetes Neg Hx   . Drug abuse Neg Hx   . Early death Neg Hx   . Hearing loss Neg Hx   . Hyperlipidemia Neg Hx   . Kidney disease Neg Hx   . Learning disabilities Neg Hx   . Mental illness Neg Hx   . Mental retardation Neg Hx   . Miscarriages / Stillbirths Neg Hx   . Vision loss Neg Hx   . Varicose  Veins Neg Hx     Social History Social History   Tobacco Use  . Smoking status: Never Smoker  . Smokeless tobacco: Never Used  Substance Use Topics  . Alcohol use: No  . Drug use: No     Allergies   Penicillins   Review of Systems Review of Systems  Constitutional: Negative for diaphoresis  and fever.  Respiratory: Negative for shortness of breath.   Cardiovascular: Positive for chest pain. Negative for palpitations and leg swelling.  Gastrointestinal: Negative for nausea.  Psychiatric/Behavioral: The patient is nervous/anxious.   All other systems reviewed and are negative.    Physical Exam Updated Vital Signs BP (!) 147/96 (BP Location: Left Arm)   Pulse (!) 59   Temp 98.1 F (36.7 C)   Resp 14   Ht  (1.727 m)   Wt 122 kg (269 lb)   LMP 06/12/2017   SpO2 100%   BMI 40.90 kg/m   Physical Exam  Constitutional: She appears well-developed and well-nourished. She does not appear ill. No distress.  HENT:  Head: Normocephalic and atraumatic.  Mouth/Throat: Oropharynx is clear and moist.  Eyes: Conjunctivae are normal.  Neck: Normal range of motion. Neck supple.  Cardiovascular: Normal rate, regular rhythm, normal heart sounds and intact distal pulses.  Pulmonary/Chest: Effort normal and breath sounds normal. No respiratory distress.  Abdominal: Soft. Bowel sounds are normal. She exhibits no distension. There is no tenderness. There is no rebound.  Musculoskeletal:  No lower extremity edema or tenderness.  Neurological: She is alert.  Skin: Skin is warm.  Psychiatric: She has a normal mood and affect. Her behavior is normal.  Nursing note and vitals reviewed.    ED Treatments / Results  Labs (all labs ordered are listed, but only abnormal results are displayed) Labs Reviewed  BASIC METABOLIC PANEL - Abnormal; Notable for the following components:      Result Value   CO2 21 (*)    Glucose, Bld 100 (*)    All other components within normal limits    CBC - Abnormal; Notable for the following components:   Hemoglobin 11.6 (*)    HCT 35.3 (*)    All other components within normal limits  D-DIMER, QUANTITATIVE (NOT AT [facility]) - Abnormal; Notable for the following components:   D-Dimer, Quant 0.72 (*)    All other components within normal limits  I-STAT TROPONIN, ED  I-STAT BETA HCG BLOOD, ED ([facility], [facility], [facility] ONLY)    EKG EKG Interpretation  Date/Time:  Sunday Jul 07 2017 23:14:44 EDT Ventricular Rate:  75 PR Interval:    QRS Duration: 91 QT Interval:  370 QTC Calculation: 414 R Axis:   63 Text Interpretation:  Sinus rhythm Normal ECG No significant change since last tracing 06 Jul 2017 Confirmed by Devoria Albe ([id number]) on 07/08/2017 12:13:24 AM   Radiology Dg Chest 2 View  Result Date: 07/08/2017 CLINICAL DATA:  Sharp chest pain beginning at 9 p.m. EXAM: CHEST - 2 VIEW COMPARISON:  Chest radiograph and chest CT Jul 06, 2017 (please note patient has had 3 CT angiograms of the chest within 1 month without pulmonary embolism). FINDINGS: Cardiomediastinal silhouette is normal. No pleural effusions or focal consolidations. Trachea projects midline and there is no pneumothorax. Soft tissue planes and included osseous structures are non-suspicious. Surgical clips in the included right abdomen compatible with cholecystectomy. IMPRESSION: Negative. Electronically Signed   By: Awilda Metro M.D.   On: 07/08/2017 01:36   Ct Angio Chest Pe W And/or Wo Contrast  Result Date: 07/06/2017 CLINICAL DATA:  37 year old female with a history of pain and shortness of breath EXAM: CT ANGIOGRAPHY CHEST WITH CONTRAST TECHNIQUE: Multidetector CT imaging of the chest was performed using the standard protocol during bolus administration of intravenous contrast. Multiplanar CT image reconstructions and MIPs were obtained to evaluate the vascular anatomy. CONTRAST:   ISOVUE-370 IOPAMIDOL (ISOVUE-370) INJECTION 76% COMPARISON:  07/03/2017 FINDINGS:  Cardiovascular: Heart: No cardiomegaly. No pericardial fluid/thickening. No significant coronary calcifications. Aorta: Unremarkable course, caliber, contour of the thoracic aorta. No aneurysm or dissection flap. No periaortic fluid. Pulmonary arteries: Sensitivity of the exam is decreased secondary to the timing of the contrast bolus. There are no central filling defects of the main pulmonary artery, lobar pulmonary arteries, segmental pulmonary arteries, or proximal subsegmental arteries. Beyond this, study is limited. Mediastinum/Nodes: No mediastinal adenopathy. Unremarkable appearance of the thoracic esophagus. Unremarkable appearance of the thoracic inlet and thyroid. Lungs/Pleura: Central airways are clear. No pleural effusion. No confluent airspace disease. No pneumothorax. Upper Abdomen: No acute. Musculoskeletal: No acute displaced fracture. Degenerative changes of the spine. Review of the MIP images confirms the above findings. IMPRESSION: CT negative for pulmonary emboli. No acute finding. Electronically Signed   By: Gilmer Mor D.O.   On: 07/06/2017 12:52    Procedures Procedures (including critical care time)  Medications Ordered in ED Medications - No data to display   Initial Impression / Assessment and Plan / ED Course  I have reviewed the triage vital signs and the nursing notes.  Pertinent labs & imaging results that were available during my care of the patient were reviewed by me and considered in my medical decision making (see chart for details).     Patient presenting to the ED for 5th visit regarding intermittent chest pain.  On exam, patient is calm though expresses worry for etiology of symptoms.  Patient with multiple normal cardiac work-ups.  3 neg CTAs of her chest in the past 3 weeks.  Today cardiac work-up is benign.  Chest x-ray is negative.  D-dimer ordered in triage is improved from last 2 values.  Do not feel repeat CTA of chest is warranted today, due to recent  high radiation exposure, and low likelihood of PE given recent negative findings and improvement in d-dimer.  Suspect symptoms are musculoskeletal versus GI versus anxiety related.  Discussed symptomatic management, will prescribe Vistaril for anxiety. Recommend PCP followup. Strict return precautions discussed. Safe for discharge.  Pt discussed with Dr. Devoria Albe, who agrees with workup and care plan for discharge.  Discussed results, findings, treatment and follow up. Patient advised of return precautions. Patient verbalized understanding and agreed with plan.  Final Clinical Impressions(s) / ED Diagnoses   Final diagnoses:  Intermittent chest pain  Anxiety    ED Discharge Orders        Ordered    hydrOXYzine (ATARAX/VISTARIL) 25 MG tablet  Every 6 hours PRN     07/08/17 0453       Madelena Maturin, [place] N, PA-C 07/08/17 1610    Devoria Albe, MD 07/08/17 [phone]

## 2017-07-20 ENCOUNTER — Other Ambulatory Visit: Payer: Self-pay

## 2017-07-20 ENCOUNTER — Emergency Department (HOSPITAL_COMMUNITY)
Admission: EM | Admit: 2017-07-20 | Discharge: 2017-07-20 | Disposition: A | Discharge status: Home / Self Care | Payer: 59 | Attending: Emergency Medicine | Admitting: Emergency Medicine

## 2017-07-20 ENCOUNTER — Encounter (HOSPITAL_COMMUNITY): Payer: Self-pay | Admitting: Emergency Medicine

## 2017-07-20 DIAGNOSIS — R42 Dizziness and giddiness: Secondary | ICD-10-CM

## 2017-07-20 DIAGNOSIS — I10 Essential (primary) hypertension: Secondary | ICD-10-CM | POA: Insufficient documentation

## 2017-07-20 DIAGNOSIS — R55 Syncope and collapse: Secondary | ICD-10-CM | POA: Diagnosis present

## 2017-07-20 DIAGNOSIS — Z79899 Other long term (current) drug therapy: Secondary | ICD-10-CM | POA: Diagnosis not present

## 2017-07-20 LAB — BASIC METABOLIC PANEL
ANION GAP: 9 (ref 5–15)
BUN: 7 mg/dL (ref 6–20)
CALCIUM: 9.4 mg/dL (ref 8.9–10.3)
CO2: 23 mmol/L (ref 22–32)
Chloride: 106 mmol/L (ref 101–111)
Creatinine, Ser: 0.75 mg/dL (ref 0.44–1.00)
GFR calc Af Amer: >60 mL/min (ref >60)
Glucose, Bld: 113 mg/dL — ABNORMAL HIGH (ref 65–99)
Potassium: 4.1 mmol/L (ref 3.5–5.1)
Sodium: 138 mmol/L (ref 135–145)

## 2017-07-20 LAB — URINALYSIS, ROUTINE W REFLEX MICROSCOPIC
BILIRUBIN URINE: NEGATIVE
GLUCOSE, UA: NEGATIVE mg/dL
HGB URINE DIPSTICK: NEGATIVE
Ketones, ur: NEGATIVE mg/dL
Leukocytes, UA: NEGATIVE
Nitrite: NEGATIVE
PROTEIN: NEGATIVE mg/dL
Specific Gravity, Urine: 1.009 (ref 1.005–1.030)
pH: 6 (ref 5.0–8.0)

## 2017-07-20 LAB — I-STAT BETA HCG BLOOD, ED (MC, WL, AP ONLY)

## 2017-07-20 LAB — CBC
HCT: 39.3 % (ref 36.0–46.0)
Hemoglobin: 12.5 g/dL (ref 12.0–15.0)
MCH: 29 pg (ref 26.0–34.0)
MCHC: 31.8 g/dL (ref 30.0–36.0)
MCV: 91.2 fL (ref 78.0–100.0)
PLATELETS: 301 10*3/uL (ref 150–400)
RBC: 4.31 MIL/uL (ref 3.87–5.11)
RDW: 13.2 % (ref 11.5–15.5)
WBC: 5.6 10*3/uL (ref 4.0–10.5)

## 2017-07-20 NOTE — ED Notes (Signed)
Got patient into a gown on the monitor patient is resting with call bell in reach 

## 2017-07-20 NOTE — ED Notes (Signed)
Registration at bedside.

## 2017-07-20 NOTE — ED Provider Notes (Signed)
[facility] EMERGENCY DEPARTMENT Provider Note   CSN: [id number] Arrival date & time: 07/20/17  1113     History   Chief Complaint Chief Complaint  Patient presents with  . Near Syncope    HPI Mindy Wade is a 37 y.o. female with past medical history of hypertension, hyperlipidemia, anxiety, presenting to the ED with near syncopal episode that occurred this morning.  Patient states around 1115 she felt warm and lightheaded.  She states she sat down, however when she began ambulating she began feeling the same symptoms.  Reports to the ED for concern of lightheadedness.  Denies syncope, chest pain, palpitations, shortness of breath, headache, vision changes, or any other associated symptoms.  Reports her level of anxiety has been elevated for about 1 month since a traumatic event.  Reports her PCP recently started her on Prozac which has been helping slightly.  The history is provided by the patient.    Past Medical History:  Diagnosis Date  . Breast feeding status of mother   . Hx of blood clots   . Hx of varicella   . Hyperlipemia   . Hypertension   . Pregnancy induced hypertension   . Pulmonary embolism [facility])     Patient Active Problem List   Diagnosis Date Noted  . Missed abortion with fetal demise before 20 completed weeks of gestation 05/03/2017  . Precipitous delivery 02/19/2015  . Postpartum care following vaginal delivery 02/19/2015  . Active labor at term 02/18/2015  . Pre-diabetes 05/14/2014  . Elevated LDL cholesterol level 05/14/2014  . Pulmonary embolism (HCC) 10/17/2013  . Labor and delivery indication for care or intervention 10/09/2013  . Spontaneous abortion in second trimester 10/09/2013  . SVD (spontaneous vaginal delivery) 03/02/2013    Past Surgical History:  Procedure Laterality Date  . CHOLECYSTECTOMY       OB History    Gravida  8   Para  7   Term  6   Preterm  1   AB  1   Living  6     SAB  1   TAB     Ectopic      Multiple  0   Live Births  7            Home Medications    Prior to Admission medications   Medication Sig Start Date End Date Taking? Authorizing Provider  amLODipine (NORVASC) 10 MG tablet Take 10 mg by mouth daily.   Yes [provider]  FLUoxetine (PROZAC) 10 MG capsule Take 10 mg by mouth daily. 07/08/17  Yes [provider]  hydrOXYzine (ATARAX/VISTARIL) 25 MG tablet Take 1 tablet (25 mg total) by mouth every 6 (six) hours as needed for anxiety. 07/08/17  Yes Robinson, [place] N, PA-C  labetalol (NORMODYNE) 200 MG tablet Take 200 mg by mouth 2 (two) times daily. Reported on 05/02/2015   Yes [provider]  loratadine (CLARITIN) 10 MG tablet Take 1 tablet (10 mg total) by mouth daily. 06/30/17  Yes Loren Racer, MD  NORLYDA 0.35 MG tablet TAKE 1 TABLET BY MOUTH DAILY 02/08/17  Yes [provider]  rosuvastatin (CRESTOR) 10 MG tablet Take 10 mg by mouth at bedtime.    Yes [provider]  spironolactone (ALDACTONE) 25 MG tablet Take 25 mg by mouth daily.   Yes [provider]  acetaminophen-codeine (TYLENOL #3) 300-30 MG tablet Take 1-2 tablets by mouth every 6 (six) hours as needed for moderate pain. Patient not  taking: Reported on 06/15/2017 05/03/17   Raelyn Mora, CNM  famotidine (PEPCID) 20 MG tablet Take 1 tablet (20 mg total) by mouth 2 (two) times daily. Patient taking differently: Take 20 mg by mouth 2 (two) times daily as needed for heartburn.  07/03/17   Terrilee Files, MD  ferrous sulfate 325 (65 FE) MG tablet Take 1 tablet (325 mg total) by mouth daily. Patient not taking: Reported on 06/15/2017 05/24/17   Rolm Bookbinder, CNM  fluticasone [facility]) 50 MCG/ACT nasal spray Place 2 sprays into both nostrils daily as needed for allergies.    [provider]  ibuprofen (ADVIL,MOTRIN) 800 MG tablet Take 1 tablet (800 mg total) by mouth every 6 (six) hours as needed. Patient not taking: Reported  on 06/15/2017 05/24/17   Rolm Bookbinder, CNM  meclizine (ANTIVERT) 25 MG tablet Take 1 tablet (25 mg total) by mouth 3 (three) times daily as needed for dizziness. Patient not taking: Reported on 07/03/2017 06/30/17   Loren Racer, MD  methocarbamol (ROBAXIN) 500 MG tablet Take 2 tablets (1,000 mg total) by mouth every 6 (six) hours as needed for muscle spasms. 06/30/17   Loren Racer, MD  misoprostol (CYTOTEC) 200 MCG tablet Place 4 tablets (800 mcg total) vaginally once for 1 dose. Patient not taking: Reported on 06/15/2017 05/03/17 06/15/25  Raelyn Mora, CNM  misoprostol (CYTOTEC) 200 MCG tablet Take 4 tablets (800 mcg total) by mouth once for 1 dose. Patient not taking: Reported on 06/15/2017 05/25/17 06/15/25  Rolm Bookbinder, CNM  promethazine (PHENERGAN) 12.5 MG tablet Take 1 tablet (12.5 mg total) by mouth every 4 (four) hours as needed for nausea or vomiting. Patient not taking: Reported on 06/15/2017 05/03/17   Raelyn Mora, CNM    Family History Family History  Problem Relation Age of Onset  . Asthma Mother   . Heart disease Mother   . Hypertension Mother   . Stroke Mother   . Cancer Father        prostate  . Alcohol abuse Neg Hx   . Arthritis Neg Hx   . Birth defects Neg Hx   . COPD Neg Hx   . Depression Neg Hx   . Diabetes Neg Hx   . Drug abuse Neg Hx   . Early death Neg Hx   . Hearing loss Neg Hx   . Hyperlipidemia Neg Hx   . Kidney disease Neg Hx   . Learning disabilities Neg Hx   . Mental illness Neg Hx   . Mental retardation Neg Hx   . Miscarriages / Stillbirths Neg Hx   . Vision loss Neg Hx   . Varicose Veins Neg Hx     Social History Social History   Tobacco Use  . Smoking status: Never Smoker  . Smokeless tobacco: Never Used  Substance Use Topics  . Alcohol use: No  . Drug use: No     Allergies   Penicillins   Review of Systems Review of Systems  Respiratory: Negative for shortness of breath.   Cardiovascular: Negative for chest pain  and palpitations.  Gastrointestinal: Negative for abdominal pain.  Neurological: Positive for light-headedness. Negative for syncope, facial asymmetry, weakness and headaches.  All other systems reviewed and are negative.    Physical Exam Updated Vital Signs BP 116/67 (BP Location: Right Arm)   Pulse 85   Temp 98.4 F (36.9 C) (Oral)   Resp 16   SpO2 100%   Physical Exam  Constitutional: She is oriented  to person, place, and time. She appears well-developed and well-nourished. No distress.  HENT:  Head: Normocephalic and atraumatic.  Mouth/Throat: Oropharynx is clear and moist.  Eyes: Pupils are equal, round, and reactive to light. Conjunctivae and EOM are normal.  Neck: Normal range of motion. Neck supple.  Cardiovascular: Normal rate, regular rhythm and intact distal pulses.  Pulmonary/Chest: Effort normal and breath sounds normal. No respiratory distress.  Abdominal: Soft. Bowel sounds are normal. She exhibits no distension. There is no tenderness. There is no guarding.  Neurological: She is alert and oriented to person, place, and time.  Mental Status:  Alert, oriented, thought content appropriate, able to give a coherent history. Speech fluent without evidence of aphasia. Able to follow 2 step commands without difficulty.  Cranial Nerves:  II:  Peripheral visual fields grossly normal, pupils equal, round, reactive to light III,IV, VI: ptosis not present, extra-ocular motions intact bilaterally  V,VII: smile symmetric, facial light touch sensation equal VIII: hearing grossly normal to voice  X: uvula elevates symmetrically  XI: bilateral shoulder shrug symmetric and strong XII: midline tongue extension without fassiculations Motor:  Normal tone. 5/5 in upper and lower extremities bilaterally including strong and equal grip strength and dorsiflexion/plantar flexion Sensory: Pinprick and light touch normal in all extremities.  Deep Tendon Reflexes: 2+ and symmetric in the  biceps and patella Cerebellar: normal finger-to-nose with bilateral upper extremities Gait: normal gait and balance CV: distal pulses palpable throughout    Skin: Skin is warm.  Psychiatric: She has a normal mood and affect. Her behavior is normal.  Nursing note and vitals reviewed.    ED Treatments / Results  Labs (all labs ordered are listed, but only abnormal results are displayed) Labs Reviewed  BASIC METABOLIC PANEL - Abnormal; Notable for the following components:      Result Value   Glucose, Bld 113 (*)    All other components within normal limits  CBC  URINALYSIS, ROUTINE W REFLEX MICROSCOPIC  I-STAT BETA HCG BLOOD, ED ([facility], [facility], [facility] ONLY)    EKG EKG Interpretation  Date/Time:  Saturday Jul 20 2017 12:11:10 EDT Ventricular Rate:  82 PR Interval:  152 QRS Duration: 84 QT Interval:  356 QTC Calculation: 415 R Axis:   50 Text Interpretation:  Normal sinus rhythm Normal ECG No significant change since last tracing Confirmed by Drema Pry [phone]) on 07/20/2017 3:42:06 PM   Radiology No results found.  Procedures Procedures (including critical care time)  Medications Ordered in ED Medications - No data to display   Initial Impression / Assessment and Plan / ED Course  I have reviewed the triage vital signs and the nursing notes.  Pertinent labs & imaging results that were available during my care of the patient were reviewed by me and considered in my medical decision making (see chart for details).     Patient presenting to the ED with complaints of lightheadedness and near syncopal episode.  No palpitations, chest pain, shortness of breath, headache, vision changes.  Patient does have history of anxiety, and states that has been elevated since a traumatic event about 1 month ago.  On exam, patient is well-appearing, vital signs stable.  Heart and lung exam normal.  Normal neurologic exam.  UA normal.  hCG negative.  CBC within normal limits.  BMP within  normal limits.  EKG normal sinus rhythm.  Discussed oral hydration containing good sleep pattern.  Recommend PCP follow-up.  Strict return precautions discussed.  Safe for discharge.  Discussed results,  findings, treatment and follow up. Patient advised of return precautions. Patient verbalized understanding and agreed with plan.   Final Clinical Impressions(s) / ED Diagnoses   Final diagnoses:  Lightheadedness    ED Discharge Orders    None       Robinson, [place] N, PA-C 07/20/17 1542    Nira Conn, MD 07/20/17 [phone]

## 2017-07-20 NOTE — Discharge Instructions (Addendum)
All of your lab work today is reassuring and normal. Schedule an appointment with your primary care provider to follow-up on your visit today. Stay hydrated and get adequate sleep.

## 2017-07-20 NOTE — ED Triage Notes (Signed)
Patient states she at approximately 1115 this morning she suddenly felt very warm inside and felt like she might pass out while she was doing housework. Report history of anxiety after a traumatic experience a month ago.

## 2017-07-20 NOTE — ED Notes (Signed)
Patient able to ambulate independently  

## 2017-08-11 ENCOUNTER — Emergency Department (HOSPITAL_COMMUNITY)
Admission: EM | Admit: 2017-08-11 | Discharge: 2017-08-11 | Disposition: A | Discharge status: Home / Self Care | Payer: 59 | Attending: Emergency Medicine | Admitting: Emergency Medicine

## 2017-08-11 ENCOUNTER — Other Ambulatory Visit: Payer: Self-pay

## 2017-08-11 ENCOUNTER — Emergency Department (HOSPITAL_COMMUNITY): Payer: 59

## 2017-08-11 ENCOUNTER — Encounter (HOSPITAL_COMMUNITY): Payer: Self-pay | Admitting: Emergency Medicine

## 2017-08-11 DIAGNOSIS — R42 Dizziness and giddiness: Secondary | ICD-10-CM | POA: Diagnosis not present

## 2017-08-11 DIAGNOSIS — M542 Cervicalgia: Secondary | ICD-10-CM | POA: Insufficient documentation

## 2017-08-11 DIAGNOSIS — Z79899 Other long term (current) drug therapy: Secondary | ICD-10-CM | POA: Diagnosis not present

## 2017-08-11 DIAGNOSIS — I10 Essential (primary) hypertension: Secondary | ICD-10-CM | POA: Diagnosis not present

## 2017-08-11 DIAGNOSIS — R51 Headache: Secondary | ICD-10-CM | POA: Diagnosis present

## 2017-08-11 DIAGNOSIS — R079 Chest pain, unspecified: Secondary | ICD-10-CM | POA: Insufficient documentation

## 2017-08-11 LAB — CBC
HEMATOCRIT: 36.9 % (ref 36.0–46.0)
HEMOGLOBIN: 11.8 g/dL — AB (ref 12.0–15.0)
MCH: 28.9 pg (ref 26.0–34.0)
MCHC: 32 g/dL (ref 30.0–36.0)
MCV: 90.2 fL (ref 78.0–100.0)
Platelets: 312 10*3/uL (ref 150–400)
RBC: 4.09 MIL/uL (ref 3.87–5.11)
RDW: 13.2 % (ref 11.5–15.5)
WBC: 7.8 10*3/uL (ref 4.0–10.5)

## 2017-08-11 LAB — BASIC METABOLIC PANEL
Anion gap: 8 (ref 5–15)
BUN: 9 mg/dL (ref 6–20)
CHLORIDE: 104 mmol/L (ref 101–111)
CO2: 23 mmol/L (ref 22–32)
Calcium: 9 mg/dL (ref 8.9–10.3)
Creatinine, Ser: 1.02 mg/dL — ABNORMAL HIGH (ref 0.44–1.00)
GFR calc Af Amer: >60 mL/min (ref >60)
GFR calc non Af Amer: >60 mL/min (ref >60)
GLUCOSE: 121 mg/dL — AB (ref 65–99)
POTASSIUM: 3.6 mmol/L (ref 3.5–5.1)
Sodium: 135 mmol/L (ref 135–145)

## 2017-08-11 LAB — I-STAT BETA HCG BLOOD, ED (MC, WL, AP ONLY): I-stat hCG, quantitative: <5 m[IU]/mL (ref <5)

## 2017-08-11 LAB — I-STAT TROPONIN, ED: Troponin i, poc: 0 ng/mL (ref 0.00–0.08)

## 2017-08-11 MED ORDER — KETOROLAC TROMETHAMINE 30 MG/ML IJ SOLN
30.0000 mg | Freq: Once | INTRAMUSCULAR | Status: AC
  Administered 2017-08-11: 30 mg via INTRAMUSCULAR
  Filled 2017-08-11: qty 1

## 2017-08-11 MED ORDER — METHOCARBAMOL 500 MG PO TABS
500.0000 mg | ORAL_TABLET | Freq: Once | ORAL | Status: AC
  Administered 2017-08-11: 500 mg via ORAL
  Filled 2017-08-11: qty 1

## 2017-08-11 NOTE — ED Provider Notes (Signed)
[facility] EMERGENCY DEPARTMENT Provider Note   CSN: [id number] Arrival date & time: 08/11/17  0119     History   Chief Complaint Chief Complaint  Patient presents with  . Headache  . Chest Pain    HPI Mindy Wade is a 37 y.o. female.  HPI   Patient is a 38 year old female with a history of hypertension, hyperlipidemia, and pulmonary embolism in 2015 presenting for an episode of feeling "flushed" beginning with a "heat/burning" sensation in her chest that spread quickly to her head and down her body all at one time approximately at 1 AM when she was going to sleep.  Patient reports the sensation has since resolved, however she has persistent pain in bilateral paraspinal cervical musculature.  Patient reports that she had a similar episode a couple weeks ago.  Patient denies any fevers, chills, chest pain or shortness of breath at present, or syncope.  Patient reports that when this first occurred she felt presyncopal.  Patient reports that she recently began a new medication regimen including Prozac and as needed Klonopin.  Patient reports that she typically takes her labetalol earlier in the evening, last night she took it late in the evening while at work.  Patient reports that she took a Klonopin just prior to leaving work.  Patient also notes that yesterday evening she had right-sided low back pain that was severe in nature, but resolved after taking 2 Tylenol.  Patient reports performing heavy lifting at her job last night.  Past Medical History:  Diagnosis Date  . Breast feeding status of mother   . Hx of blood clots   . Hx of varicella   . Hyperlipemia   . Hypertension   . Pregnancy induced hypertension   . Pulmonary embolism [facility])     Patient Active Problem List   Diagnosis Date Noted  . Missed abortion with fetal demise before 20 completed weeks of gestation 05/03/2017  . Precipitous delivery 02/19/2015  . Postpartum care following vaginal  delivery 02/19/2015  . Active labor at term 02/18/2015  . Pre-diabetes 05/14/2014  . Elevated LDL cholesterol level 05/14/2014  . Pulmonary embolism (HCC) 10/17/2013  . Labor and delivery indication for care or intervention 10/09/2013  . Spontaneous abortion in second trimester 10/09/2013  . SVD (spontaneous vaginal delivery) 03/02/2013    Past Surgical History:  Procedure Laterality Date  . CHOLECYSTECTOMY       OB History    Gravida  8   Para  7   Term  6   Preterm  1   AB  1   Living  6     SAB  1   TAB      Ectopic      Multiple  0   Live Births  7            Home Medications    Prior to Admission medications   Medication Sig Start Date End Date Taking? Authorizing Provider  amLODipine (NORVASC) 10 MG tablet Take 10 mg by mouth daily.   Yes [provider]  famotidine (PEPCID) 20 MG tablet Take 1 tablet (20 mg total) by mouth 2 (two) times daily. Patient taking differently: Take 20 mg by mouth 2 (two) times daily as needed for heartburn.  07/03/17  Yes Terrilee Files, MD  FLUoxetine (PROZAC) 10 MG capsule Take 10 mg by mouth daily. 07/08/17  Yes [provider]  fluticasone (FLONASE) 50 MCG/ACT nasal spray Place 2 sprays into both  nostrils daily as needed for allergies.   Yes [provider]  hydrOXYzine (ATARAX/VISTARIL) 25 MG tablet Take 1 tablet (25 mg total) by mouth every 6 (six) hours as needed for anxiety. 07/08/17  Yes Robinson, [place] N, PA-C  labetalol (NORMODYNE) 200 MG tablet Take 200 mg by mouth 2 (two) times daily. Reported on 05/02/2015   Yes [provider]  Multiple Vitamins-Minerals (MULTI-DAY PLUS MINERALS PO) Take 1 tablet by mouth daily.   Yes [provider]  NORLYDA 0.35 MG tablet TAKE 1 TABLET BY MOUTH DAILY 02/08/17  Yes [provider]  rosuvastatin (CRESTOR) 10 MG tablet Take 10 mg by mouth at bedtime.    Yes [provider]  spironolactone (ALDACTONE) 25 MG tablet  Take 25 mg by mouth daily.   Yes [provider]  acetaminophen-codeine (TYLENOL #3) 300-30 MG tablet Take 1-2 tablets by mouth every 6 (six) hours as needed for moderate pain. Patient not taking: Reported on 06/15/2017 05/03/17   Raelyn Mora, CNM  ferrous sulfate 325 (65 FE) MG tablet Take 1 tablet (325 mg total) by mouth daily. Patient not taking: Reported on 06/15/2017 05/24/17   Rolm Bookbinder, CNM  ibuprofen (ADVIL,MOTRIN) 800 MG tablet Take 1 tablet (800 mg total) by mouth every 6 (six) hours as needed. Patient not taking: Reported on 06/15/2017 05/24/17   Rolm Bookbinder, CNM  loratadine (CLARITIN) 10 MG tablet Take 1 tablet (10 mg total) by mouth daily. Patient not taking: Reported on 08/11/2017 06/30/17   Loren Racer, MD  meclizine (ANTIVERT) 25 MG tablet Take 1 tablet (25 mg total) by mouth 3 (three) times daily as needed for dizziness. Patient not taking: Reported on 07/03/2017 06/30/17   Loren Racer, MD  methocarbamol (ROBAXIN) 500 MG tablet Take 2 tablets (1,000 mg total) by mouth every 6 (six) hours as needed for muscle spasms. Patient not taking: Reported on 07/20/2017 06/30/17   Loren Racer, MD  misoprostol (CYTOTEC) 200 MCG tablet Place 4 tablets (800 mcg total) vaginally once for 1 dose. Patient not taking: Reported on 06/15/2017 05/03/17 06/15/25  Raelyn Mora, CNM  misoprostol (CYTOTEC) 200 MCG tablet Take 4 tablets (800 mcg total) by mouth once for 1 dose. Patient not taking: Reported on 06/15/2017 05/25/17 06/15/25  Rolm Bookbinder, CNM    Family History Family History  Problem Relation Age of Onset  . Asthma Mother   . Heart disease Mother   . Hypertension Mother   . Stroke Mother   . Cancer Father        prostate  . Alcohol abuse Neg Hx   . Arthritis Neg Hx   . Birth defects Neg Hx   . COPD Neg Hx   . Depression Neg Hx   . Diabetes Neg Hx   . Drug abuse Neg Hx   . Early death Neg Hx   . Hearing loss Neg Hx   . Hyperlipidemia Neg Hx   .  Kidney disease Neg Hx   . Learning disabilities Neg Hx   . Mental illness Neg Hx   . Mental retardation Neg Hx   . Miscarriages / Stillbirths Neg Hx   . Vision loss Neg Hx   . Varicose Veins Neg Hx     Social History Social History   Tobacco Use  . Smoking status: Never Smoker  . Smokeless tobacco: Never Used  Substance Use Topics  . Alcohol use: No  . Drug use: No     Allergies   Penicillins  Review of Systems Review of Systems  Constitutional: Negative for chills and fever.  HENT: Negative for congestion and rhinorrhea.   Eyes: Negative for visual disturbance.  Respiratory: Negative for chest tightness and shortness of breath.   Cardiovascular: Negative for chest pain, palpitations and leg swelling.  Gastrointestinal: Positive for nausea. Negative for abdominal pain and vomiting.  Musculoskeletal: Negative for arthralgias and myalgias.  Skin: Negative for color change and rash.  Neurological: Positive for light-headedness. Negative for dizziness, syncope and weakness.  All other systems reviewed and are negative.    Physical Exam Updated Vital Signs BP 132/80 (BP Location: Right Arm)   Pulse 75   Temp 98.1 F (36.7 C) (Oral)   Resp 16   SpO2 100%   Physical Exam  Constitutional: She appears well-developed and well-nourished. No distress.  HENT:  Head: Normocephalic and atraumatic.  Mouth/Throat: Oropharynx is clear and moist.  Eyes: Pupils are equal, round, and reactive to light. Conjunctivae and EOM are normal.  Neck: Normal range of motion. Neck supple. No neck rigidity.  Cardiovascular: Normal rate, regular rhythm, S1 normal and S2 normal.  No murmur heard. Pulses:      Radial pulses are 2+ on the right side, and 2+ on the left side.       Dorsalis pedis pulses are 2+ on the right side, and 2+ on the left side.  Pulmonary/Chest: Effort normal and breath sounds normal. She has no wheezes. She has no rales.  Abdominal: Soft. She exhibits no  distension. There is no tenderness. There is no guarding.  Musculoskeletal: Normal range of motion. She exhibits no edema or deformity.  No midline tenderness of cervical, thoracic, or lumbar spine.  Bilateral paraspinal muscular tenderness.  Lymphadenopathy:    She has no cervical adenopathy.  Neurological: She is alert. GCS eye subscore is 4. GCS verbal subscore is 5. GCS motor subscore is 6.  Mental Status:  Alert, oriented, thought content appropriate, able to give a coherent history. Speech fluent without evidence of aphasia. Able to follow 2 step commands without difficulty.  Cranial Nerves: Cns II-XII grossly intact. Motor:  Normal tone. 5/5 in upper and lower extremities bilaterally including strong and equal grip strength and dorsiflexion/plantar flexion Sensory: Light touch normal in all extremities.  Gait: normal gait and balance Stance: Romberg negative. No pronator drift and good coordination, strength, and position sense with tapping of bilateral arms (performed in sitting position). CV: distal pulses palpable throughout   Skin: Skin is warm and dry. No rash noted. No erythema.  Psychiatric: She has a normal mood and affect. Her behavior is normal. Judgment and thought content normal.  Nursing note and vitals reviewed.    ED Treatments / Results  Labs (all labs ordered are listed, but only abnormal results are displayed) Labs Reviewed  BASIC METABOLIC PANEL - Abnormal; Notable for the following components:      Result Value   Glucose, Bld 121 (*)    Creatinine, Ser 1.02 (*)    All other components within normal limits  CBC - Abnormal; Notable for the following components:   Hemoglobin 11.8 (*)    All other components within normal limits  I-STAT TROPONIN, ED  I-STAT BETA HCG BLOOD, ED ([facility], [facility], [facility] ONLY)    EKG EKG Interpretation  Date/Time:  Sunday August 11 2017 01:41:03 EDT Ventricular Rate:  84 PR Interval:  148 QRS Duration: 86 QT Interval:  354 QTC  Calculation: 418 R Axis:   65 Text Interpretation:  Normal  sinus rhythm Normal ECG No significant change since last tracing Confirmed by Marily Memos [phone]) on 08/11/2017 6:41:08 AM   Radiology Dg Chest 2 View  Result Date: 08/11/2017 CLINICAL DATA:  Chest pain EXAM: CHEST - 2 VIEW COMPARISON:  07/08/2017 FINDINGS: The heart size and mediastinal contours are within normal limits. Both lungs are clear. The visualized skeletal structures are unremarkable. Surgical clips in the right upper quadrant IMPRESSION: No active cardiopulmonary disease. Electronically Signed   By: Jasmine Pang M.D.   On: 08/11/2017 01:59    Procedures Procedures (including critical care time)  Medications Ordered in ED Medications  ketorolac (TORADOL) 30 MG/ML injection 30 mg (has no administration in time range)  methocarbamol (ROBAXIN) tablet 500 mg (has no administration in time range)     Initial Impression / Assessment and Plan / ED Course  I have reviewed the triage vital signs and the nursing notes.  Pertinent labs & imaging results that were available during my care of the patient were reviewed by me and considered in my medical decision making (see chart for details).  Clinical Course as of Aug 11 812  Sun Aug 11, 2017  0740 Reassessed patient. Patient feeling well with resolution of neck stiffness.  Patient had repeated episodes of heart rate in the 50s, patient reported she noted looking up with a monitor that she had a heart rate in the 40s.  I discussed with patient that her labetalol may be contributing to this, and could be contributing to her symptoms.  encourage patient to discuss this with primary care provider at her follow-up visit in 2 days.  Return precautions were given for any chest pain, shortness of breath, syncope or presyncope.  Patient is in understanding and agrees with the plan of care.  Patient stable for discharge.   [AM]    Clinical Course User Index [AM] Elisha Ponder,  PA-C    Patient nontoxic-appearing, afebrile, acute distress.  Diagnosis includes ACS, pulmonary embolism, thoracic aortic dissection, vasovagal reaction, iatrogenic reaction to recent medication changes. Doubt ACS, as patient is having highly atypical symptoms, they are nonexertional, and patient has a history of prior similar episodes.  Doubt pulmonary embolism, as patient is denying any chest pain or shortness of breath at present, her prior PE was likely provoked by high estrogen pregnancy state, and she has had multiple pulmonary embolism work-ups with more concerning symptoms within the last 2 to 3 months, all negative.   Suspect that patient's symptoms may be iatrogenic, as she took labetalol and Klonopin within a short period of time.  Will provide symptomatic relief for patient's sensation of tension in the posterior neck and reassess.  Patient is neurologically intact.  Labs normal and hemoglobin of 11.8 per baseline.   Patient reports complete resolution of her symptoms at time of discharge.  Patient felt to be a low neurologic and cardiac risk with her symptoms.  Return precautions discussed.  Patient is in understanding and agrees with plan of care.  Final Clinical Impressions(s) / ED Diagnoses   Final diagnoses:  Lightheadedness  Bilateral neck pain    ED Discharge Orders    None       Delia Chimes 08/11/17 1914    Marily Memos, MD 08/11/17 2303

## 2017-08-11 NOTE — Discharge Instructions (Signed)
Please see the information and instructions below regarding your visit.  Your diagnoses today include:  1. Lightheadedness   2. Bilateral neck pain     Tests performed today include: See side panel of your discharge paperwork for testing performed today. Vital signs are listed at the bottom of these instructions.   Your work-up is reassuring today.  Medications prescribed:    Take any prescribed medications only as prescribed, and any over the counter medications only as directed on the packaging.  Please try to separate your Klonopin and labetalol by several hours.  Home care instructions:  Please follow any educational materials contained in this packet.   Follow-up instructions: Please follow-up with your primary care provider in 2 days for further evaluation of your symptoms if they are not completely improved.   Return instructions:  Please return to the Emergency Department if you experience worsening symptoms.  Please return the emergency department if you develop any episodes of chest pain, shortness of breath, feeling you are going to pass out or passing out. Please return if you have any other emergent concerns.  Additional Information:   Your vital signs today were: BP 113/73    Pulse (!) 51    Temp 98.1 F (36.7 C) (Oral)    Resp 14    SpO2 97%  If your blood pressure (BP) was elevated on multiple readings during this visit above 130 for the top number or above 80 for the bottom number, please have this repeated by your primary care provider within one month. --------------  Thank you for allowing us to participate in your care today.

## 2017-08-11 NOTE — ED Triage Notes (Addendum)
Pt states tonight she felt a "hot feeling" in her body and then neck and head pain.  States she did have right-sided back pain for the past 4 hours as well.  At the end of triage assessment pt states she is having chest pain 10/10 and feels short of breath

## 2017-08-16 ENCOUNTER — Other Ambulatory Visit: Payer: Self-pay

## 2017-08-16 ENCOUNTER — Emergency Department (HOSPITAL_COMMUNITY): Payer: 59

## 2017-08-16 ENCOUNTER — Emergency Department (HOSPITAL_COMMUNITY)
Admission: EM | Admit: 2017-08-16 | Discharge: 2017-08-16 | Disposition: A | Discharge status: Home / Self Care | Payer: 59 | Attending: Emergency Medicine | Admitting: Emergency Medicine

## 2017-08-16 DIAGNOSIS — Z79899 Other long term (current) drug therapy: Secondary | ICD-10-CM | POA: Insufficient documentation

## 2017-08-16 DIAGNOSIS — R0789 Other chest pain: Secondary | ICD-10-CM | POA: Insufficient documentation

## 2017-08-16 DIAGNOSIS — I10 Essential (primary) hypertension: Secondary | ICD-10-CM | POA: Diagnosis not present

## 2017-08-16 DIAGNOSIS — K2900 Acute gastritis without bleeding: Secondary | ICD-10-CM | POA: Diagnosis not present

## 2017-08-16 LAB — BASIC METABOLIC PANEL
Anion gap: 9 (ref 5–15)
BUN: 8 mg/dL (ref 6–20)
CHLORIDE: 107 mmol/L (ref 101–111)
CO2: 24 mmol/L (ref 22–32)
Calcium: 9.5 mg/dL (ref 8.9–10.3)
Creatinine, Ser: 0.8 mg/dL (ref 0.44–1.00)
GFR calc Af Amer: >60 mL/min (ref >60)
GFR calc non Af Amer: >60 mL/min (ref >60)
GLUCOSE: 101 mg/dL — AB (ref 65–99)
POTASSIUM: 3.9 mmol/L (ref 3.5–5.1)
SODIUM: 140 mmol/L (ref 135–145)

## 2017-08-16 LAB — TROPONIN I: Troponin I: <0.03 ng/mL (ref <0.03)

## 2017-08-16 LAB — CBC
HEMATOCRIT: 38.8 % (ref 36.0–46.0)
Hemoglobin: 12.3 g/dL (ref 12.0–15.0)
MCH: 28.9 pg (ref 26.0–34.0)
MCHC: 31.7 g/dL (ref 30.0–36.0)
MCV: 91.1 fL (ref 78.0–100.0)
Platelets: 320 10*3/uL (ref 150–400)
RBC: 4.26 MIL/uL (ref 3.87–5.11)
RDW: 13.4 % (ref 11.5–15.5)
WBC: 6.1 10*3/uL (ref 4.0–10.5)

## 2017-08-16 LAB — I-STAT BETA HCG BLOOD, ED (MC, WL, AP ONLY): I-stat hCG, quantitative: <5 m[IU]/mL (ref <5)

## 2017-08-16 MED ORDER — OMEPRAZOLE 20 MG PO CPDR: 20.0000 mg | DELAYED_RELEASE_CAPSULE | Freq: Two times a day (BID) | ORAL | 0 refills | Status: DC

## 2017-08-16 MED ORDER — GI COCKTAIL ~~LOC~~
30.0000 mL | Freq: Once | ORAL | Status: AC
  Administered 2017-08-16: 30 mL via ORAL
  Filled 2017-08-16: qty 30

## 2017-08-16 MED ORDER — RANITIDINE HCL 150 MG PO CAPS: 150.0000 mg | ORAL_CAPSULE | Freq: Two times a day (BID) | ORAL | 0 refills | Status: DC

## 2017-08-16 MED ORDER — IOPAMIDOL (ISOVUE-370) INJECTION 76%
INTRAVENOUS | Status: AC
  Administered 2017-08-16: 10:00:00
  Filled 2017-08-16: qty 100

## 2017-08-16 MED ORDER — IOPAMIDOL (ISOVUE-370) INJECTION 76%
100.0000 mL | Freq: Once | INTRAVENOUS | Status: AC
  Administered 2017-08-16: 100 mL via INTRAVENOUS

## 2017-08-16 NOTE — Discharge Instructions (Addendum)
Your chest discomfort is likely due to heart burn/gastritis.  Take zantac and prilosec 30 minutes before each major meals for 2 weeks. Follow up with your doctor for further care.

## 2017-08-16 NOTE — ED Notes (Signed)
Pt has been chest pain intermittently for the past two weeks. C/o squeezing chest pain with nausea that started around 8pm then subsided; reoccurred again this morning that woke pt from sleep

## 2017-08-16 NOTE — ED Provider Notes (Signed)
[facility] EMERGENCY DEPARTMENT Provider Note   CSN: [id number] Arrival date & time: 08/16/17  0325     History   Chief Complaint Chief Complaint  Patient presents with  . Chest Pain    HPI Mindy Wade is a 37 y.o. female.  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  Patient report intermittent chest discomfort ongoing for the past month.  She described the discomfort as a tightness pressure sensation across the chest usually lasting for several minutes, often present at rest but occasionally with exertion.  Symptoms seem to be increasing in frequency and yesterday she also experiencing some nausea, lightheadedness and associated shortness of breath.  She woke up this morning with the same pressure sensation and was concerned prompting her ER visit.  Pain is minimal at this time.  No associated fever, chills, productive cough, exertional chest pain, hemoptysis, abdominal pain, nausea, vomiting.  She had PE several years ago after having a miscarriage.  She does have family history of cardiac disease.  Patient denies tobacco abuse or alcohol abuse.  She denies any specific treatment tried.  She denies any postprandial pain.  She did report being more active lately with increased exercise regime and is relating her discomfort to her increased workload.  She also report perform heavy lifting at work on a consistent basis.  Past Medical History:  Diagnosis Date  . Breast feeding status of mother   . Hx of blood clots   . Hx of varicella   . Hyperlipemia   . Hypertension   . Pregnancy induced hypertension   . Pulmonary embolism [facility](HCC)     Patient Active Problem List   Diagnosis Date Noted  . Missed abortion with fetal demise before 20 completed weeks of gestation 05/03/2017  . Precipitous delivery 02/19/2015  . Postpartum care following vaginal delivery 02/19/2015  .  Active labor at term 02/18/2015  . Pre-diabetes 05/14/2014  . Elevated LDL cholesterol level 05/14/2014  . Pulmonary embolism (HCC) 10/17/2013  . Labor and delivery indication for care or intervention 10/09/2013  . Spontaneous abortion in second trimester 10/09/2013  . SVD (spontaneous vaginal delivery) 03/02/2013    Past Surgical History:  Procedure Laterality Date  . CHOLECYSTECTOMY       OB History    Gravida  8   Para  7   Term  6   Preterm  1   AB  1   Living  6     SAB  1   TAB      Ectopic      Multiple  0   Live Births  7            Home Medications    Prior to Admission medications   Medication Sig Start Date End Date Taking? Authorizing Provider  acetaminophen-codeine (TYLENOL #3) 300-30 MG tablet Take 1-2 tablets by mouth every 6 (six) hours as needed for moderate pain. Patient not taking: Reported on 06/15/2017 05/03/17   Raelyn Moraawson, Rolitta, CNM  amLODipine (NORVASC) 10 MG tablet Take 10 mg by mouth daily.    [provider]  famotidine (PEPCID) 20 MG tablet Take 1 tablet (20 mg total) by mouth 2 (two) times daily. Patient taking differently: Take 20 mg by mouth 2 (two) times daily as needed for heartburn.  07/03/17   Terrilee FilesButler, Michael C, MD  ferrous sulfate 325 (65 FE) MG tablet Take 1 tablet (325 mg  total) by mouth daily. Patient not taking: Reported on 06/15/2017 05/24/17   Rolm Bookbinder, CNM  FLUoxetine (PROZAC) 10 MG capsule Take 10 mg by mouth daily. 07/08/17   [provider]  fluticasone (FLONASE) 50 MCG/ACT nasal spray Place 2 sprays into both nostrils daily as needed for allergies.    [provider]  hydrOXYzine (ATARAX/VISTARIL) 25 MG tablet Take 1 tablet (25 mg total) by mouth every 6 (six) hours as needed for anxiety. 07/08/17   Robinson, [place] N, PA-C  ibuprofen (ADVIL,MOTRIN) 800 MG tablet Take 1 tablet (800 mg total) by mouth every 6 (six) hours as needed. Patient not taking: Reported on 06/15/2017 05/24/17    Rolm Bookbinder, CNM  labetalol (NORMODYNE) 200 MG tablet Take 200 mg by mouth 2 (two) times daily. Reported on 05/02/2015    [provider]  loratadine (CLARITIN) 10 MG tablet Take 1 tablet (10 mg total) by mouth daily. Patient not taking: Reported on 08/11/2017 06/30/17   Loren Racer, MD  meclizine (ANTIVERT) 25 MG tablet Take 1 tablet (25 mg total) by mouth 3 (three) times daily as needed for dizziness. Patient not taking: Reported on 07/03/2017 06/30/17   Loren Racer, MD  methocarbamol (ROBAXIN) 500 MG tablet Take 2 tablets (1,000 mg total) by mouth every 6 (six) hours as needed for muscle spasms. Patient not taking: Reported on 07/20/2017 06/30/17   Loren Racer, MD  misoprostol (CYTOTEC) 200 MCG tablet Place 4 tablets (800 mcg total) vaginally once for 1 dose. Patient not taking: Reported on 06/15/2017 05/03/17 06/15/25  Raelyn Mora, CNM  misoprostol (CYTOTEC) 200 MCG tablet Take 4 tablets (800 mcg total) by mouth once for 1 dose. Patient not taking: Reported on 06/15/2017 05/25/17 06/15/25  Rolm Bookbinder, CNM  Multiple Vitamins-Minerals (MULTI-DAY PLUS MINERALS PO) Take 1 tablet by mouth daily.    [provider]  NORLYDA 0.35 MG tablet TAKE 1 TABLET BY MOUTH DAILY 02/08/17   [provider]  rosuvastatin (CRESTOR) 10 MG tablet Take 10 mg by mouth at bedtime.     [provider]  spironolactone (ALDACTONE) 25 MG tablet Take 25 mg by mouth daily.    [provider]    Family History Family History  Problem Relation Age of Onset  . Asthma Mother   . Heart disease Mother   . Hypertension Mother   . Stroke Mother   . Cancer Father        prostate  . Alcohol abuse Neg Hx   . Arthritis Neg Hx   . Birth defects Neg Hx   . COPD Neg Hx   . Depression Neg Hx   . Diabetes Neg Hx   . Drug abuse Neg Hx   . Early death Neg Hx   . Hearing loss Neg Hx   . Hyperlipidemia Neg Hx   . Kidney disease Neg Hx   . Learning disabilities Neg Hx     . Mental illness Neg Hx   . Mental retardation Neg Hx   . Miscarriages / Stillbirths Neg Hx   . Vision loss Neg Hx   . Varicose Veins Neg Hx     Social History Social History   Tobacco Use  . Smoking status: Never Smoker  . Smokeless tobacco: Never Used  Substance Use Topics  . Alcohol use: No  . Drug use: No     Allergies   Penicillins   Review of Systems Review of Systems  All other systems reviewed and are negative.  Physical Exam Updated Vital Signs BP 106/72   Pulse (!) 56   Temp 98.4 F (36.9 C) (Oral)   Resp 19   Ht 5\' 8"  (1.727 m)   Wt 119.7 kg (264 lb)   LMP 08/09/2017   SpO2 98%   BMI 40.14 kg/m   Physical Exam  Constitutional: She appears well-developed and well-nourished. No distress.  Obese female resting comfortably in bed in no acute discomfort.  HENT:  Head: Atraumatic.  Eyes: Conjunctivae are normal.  Neck: Neck supple.  Cardiovascular: Normal rate, regular rhythm, intact distal pulses and normal pulses.  Pulmonary/Chest: Effort normal and breath sounds normal. No accessory muscle usage. She has no decreased breath sounds. She has no wheezes. She has no rales.  Abdominal: Soft. There is no tenderness.  Musculoskeletal: Normal range of motion.       Right lower leg: She exhibits no edema.       Left lower leg: She exhibits no edema.  Neurological: She is alert.  Skin: No rash noted.  Psychiatric: She has a normal mood and affect.  Nursing note and vitals reviewed.    ED Treatments / Results  Labs (all labs ordered are listed, but only abnormal results are displayed) Labs Reviewed  BASIC METABOLIC PANEL - Abnormal; Notable for the following components:      Result Value   Glucose, Bld 101 (*)    All other components within normal limits  CBC  TROPONIN I  TROPONIN I  I-STAT BETA HCG BLOOD, ED ([facility], [facility], [facility] ONLY)    EKG None  ED ECG REPORT   Date: 08/16/2017  Rate: 70  Rhythm: normal sinus rhythm and sinus arrhythmia   QRS Axis: normal  Intervals: normal  ST/T Wave abnormalities: nonspecific ST changes  Conduction Disutrbances:none  Narrative Interpretation:   Old EKG Reviewed: unchanged  I have personally reviewed the EKG tracing and agree with the computerized printout as noted.   Radiology Dg Chest 2 View  Result Date: 08/16/2017 CLINICAL DATA:  Chest pain EXAM: CHEST - 2 VIEW COMPARISON:  08/11/2017, 07/08/2017 FINDINGS: The heart size and mediastinal contours are within normal limits. Both lungs are clear. The visualized skeletal structures are unremarkable. IMPRESSION: No active cardiopulmonary disease. Electronically Signed   By: Jasmine Pang M.D.   On: 08/16/2017 03:59    Procedures Procedures (including critical care time)  Medications Ordered in ED Medications  iopamidol (ISOVUE-370) 76 % injection 100 mL (100 mLs Intravenous Contrast Given 08/16/17 0700)  iopamidol (ISOVUE-370) 76 % injection (  Contrast Given 08/16/17 1001)  gi cocktail (Maalox,Lidocaine,Donnatal) (30 mLs Oral Given 08/16/17 1000)     Initial Impression / Assessment and Plan / ED Course  I have reviewed the triage vital signs and the nursing notes.  Pertinent labs & imaging results that were available during my care of the patient were reviewed by me and considered in my medical decision making (see chart for details).     BP 115/64   Pulse (!) 58   Temp 98.4 F (36.9 C) (Oral)   Resp 15   Ht 5\' 8"  (1.727 m)   Wt 119.7 kg (264 lb)   LMP 08/09/2017   SpO2 98%   BMI 40.14 kg/m    Final Clinical Impressions(s) / ED Diagnoses   Final diagnoses:  Atypical chest pain  Other acute gastritis without hemorrhage    ED Discharge Orders        Ordered    omeprazole (PRILOSEC) 20 MG capsule  2  times daily before meals     08/16/17 1039    ranitidine (ZANTAC) 150 MG capsule  2 times daily     08/16/17 1039     6:12 AM Patient presents with chest pain atypical for ACS.  However she does have prior history  of PE which put her at a greater risk to develop blood clot.  Given her risk factor, will obtain chest CT angiogram to rule out PE.  Her initial EKG, troponin, chest x-ray, pregnancy test, and basic labs are reassuring.  10:37 AM Normal delta troponin.  Chest CT angiogram without acute changes no evidence of PE.  Patient felt moderate relief after GI cocktail.  Suspect gastritis/GERD causing her symptoms.  Patient discharged home with PPI and H2 blocker and she will follow-up closely with her PCP for further care.  Return precautions discussed.  She is stable for discharge.   Fayrene Helper, PA-C 08/16/17 1042    Gwyneth Sprout, MD 08/17/17 [phone]

## 2017-08-16 NOTE — ED Triage Notes (Signed)
Patient states that she awoke to a squeezing CP accompanied by shortness of breath, with nausea.

## 2017-09-04 ENCOUNTER — Ambulatory Visit (HOSPITAL_COMMUNITY)
Admission: EM | Admit: 2017-09-04 | Discharge: 2017-09-04 | Disposition: A | Discharge status: Home / Self Care | Payer: 59 | Attending: Family Medicine | Admitting: Family Medicine

## 2017-09-04 ENCOUNTER — Encounter (HOSPITAL_COMMUNITY): Payer: Self-pay | Admitting: Emergency Medicine

## 2017-09-04 ENCOUNTER — Other Ambulatory Visit: Payer: Self-pay

## 2017-09-04 DIAGNOSIS — R42 Dizziness and giddiness: Secondary | ICD-10-CM

## 2017-09-04 DIAGNOSIS — R200 Anesthesia of skin: Secondary | ICD-10-CM | POA: Diagnosis not present

## 2017-09-04 MED ORDER — MELOXICAM 7.5 MG PO TABS: 7.5000 mg | ORAL_TABLET | Freq: Every day | ORAL | 0 refills | Status: DC

## 2017-09-04 MED ORDER — FLUTICASONE PROPIONATE 50 MCG/ACT NA SUSP: 2.0000 | Freq: Every day | NASAL | 0 refills | Status: DC

## 2017-09-04 NOTE — Discharge Instructions (Signed)
No alarming signs on exam.  Start Mobic to decrease inflammation. Do not take ibuprofen (motrin/advil)/ naproxen (aleve) while on mobic.  Continue to monitor your leg, if worsening symptoms, and able to feel your leg, leg swelling/redness/increased warmth, or leg colder than the other/blue/discolored, loss of bladder or bowel control, go to the emergency department for further evaluation.  Otherwise follow-up with your PCP within the week if symptoms not improving.  Your neurology exam was normal.  You did have some sinus pressure on exam, start Flonase as directed.  You can also start allergy medicine to help with symptoms.  If experiencing worsening symptoms, chest pain, shortness of breath, headache with blurry vision, weakness, dizziness, passing out, confusion, go to the emergency department for further evaluation.

## 2017-09-04 NOTE — ED Provider Notes (Signed)
[facility]    CSN: [id number] Arrival date & time: 09/04/17  0815     History   Chief Complaint Chief Complaint  Patient presents with  . Leg Pain    HPI Mindy Wade is a 37 y.o. female.   37 year old female with history of HLD, HTN, PE comes in for left leg numbness/tingling since waking up this morning.  She had a episode of lightheadedness, nausea that this intermittent.  States diarrhea started yesterday.  Left leg numbness to the entire leg and foot that is constant.  No obvious aggravating or alleviating factor.  She is still able to walk on her own, but states it feels "heavy".  States has some right sided back pain due to increase in gym activity prior to current symptom onset.  Denies any injury/trauma.  Denies saddle anesthesia, loss of bladder or bowel control.  Denies chest pain, shortness of breath, palpitation.  Denies dizziness, weakness, syncope.  Denies abdominal pain,  vomiting.  Denies fever, chills, night sweats.  Denies urinary symptoms such as frequency, dysuria, hematuria.  Has not tried anything for the symptoms.  States history of PE was thought to be due to pregnancy.  She was on blood thinners for 6 months, then discontinued.  States that has not had problems with PE since.  States she has had lower extremity ultrasounds before to rule out DVT, has never been positive.  She currently denies leg swelling, erythema, increased warmth.  Denies calf pain.     Past Medical History:  Diagnosis Date  . Breast feeding status of mother   . Hx of blood clots   . Hx of varicella   . Hyperlipemia   . Hypertension   . Pregnancy induced hypertension   . Pulmonary embolism [facility])     Patient Active Problem List   Diagnosis Date Noted  . Missed abortion with fetal demise before 20 completed weeks of gestation 05/03/2017  . Precipitous delivery 02/19/2015  . Postpartum care following vaginal delivery 02/19/2015  . Active labor at term 02/18/2015  .  Pre-diabetes 05/14/2014  . Elevated LDL cholesterol level 05/14/2014  . Pulmonary embolism (HCC) 10/17/2013  . Labor and delivery indication for care or intervention 10/09/2013  . Spontaneous abortion in second trimester 10/09/2013  . SVD (spontaneous vaginal delivery) 03/02/2013    Past Surgical History:  Procedure Laterality Date  . CHOLECYSTECTOMY      OB History    Gravida  8   Para  7   Term  6   Preterm  1   AB  1   Living  6     SAB  1   TAB      Ectopic      Multiple  0   Live Births  7            Home Medications    Prior to Admission medications   Medication Sig Start Date End Date Taking? Authorizing Provider  amLODipine (NORVASC) 10 MG tablet Take 10 mg by mouth daily.   Yes [provider]  famotidine (PEPCID) 20 MG tablet Take 1 tablet (20 mg total) by mouth 2 (two) times daily. Patient taking differently: Take 20 mg by mouth 2 (two) times daily as needed for heartburn.  07/03/17  Yes Terrilee Files, MD  FLUoxetine (PROZAC) 10 MG capsule Take 10 mg by mouth daily. 07/08/17  Yes [provider]  labetalol (NORMODYNE) 200 MG tablet Take 200 mg by mouth 2 (two) times  daily. Reported on 05/02/2015   Yes [provider]  Multiple Vitamins-Minerals (MULTI-DAY PLUS MINERALS PO) Take 1 tablet by mouth daily.   Yes [provider]  NORLYDA 0.35 MG tablet TAKE 1 TABLET BY MOUTH DAILY 02/08/17  Yes [provider]  rosuvastatin (CRESTOR) 10 MG tablet Take 10 mg by mouth at bedtime.    Yes [provider]  spironolactone (ALDACTONE) 25 MG tablet Take 25 mg by mouth daily.   Yes [provider]  fluticasone (FLONASE) 50 MCG/ACT nasal spray Place 2 sprays into both nostrils daily. 09/04/17   Cathie Hoops, Tanor Glaspy V, PA-C  meloxicam (MOBIC) 7.5 MG tablet Take 1 tablet (7.5 mg total) by mouth daily. 09/04/17   Belinda Fisher, PA-C    Family History Family History  Problem Relation Age of Onset  . Asthma Mother   .  Heart disease Mother   . Hypertension Mother   . Stroke Mother   . Cancer Father        prostate  . Alcohol abuse Neg Hx   . Arthritis Neg Hx   . Birth defects Neg Hx   . COPD Neg Hx   . Depression Neg Hx   . Diabetes Neg Hx   . Drug abuse Neg Hx   . Early death Neg Hx   . Hearing loss Neg Hx   . Hyperlipidemia Neg Hx   . Kidney disease Neg Hx   . Learning disabilities Neg Hx   . Mental illness Neg Hx   . Mental retardation Neg Hx   . Miscarriages / Stillbirths Neg Hx   . Vision loss Neg Hx   . Varicose Veins Neg Hx     Social History Social History   Tobacco Use  . Smoking status: Never Smoker  . Smokeless tobacco: Never Used  Substance Use Topics  . Alcohol use: No  . Drug use: No     Allergies   Penicillins   Review of Systems Review of Systems  Reason unable to perform ROS: See HPI as above.     Physical Exam Triage Vital Signs ED Triage Vitals  Enc Vitals Group     BP 09/04/17 0835 116/82     Pulse Rate 09/04/17 0835 72     Resp 09/04/17 0835 16     Temp 09/04/17 0835 98.7 F (37.1 C)     Temp Source 09/04/17 0835 Oral     SpO2 09/04/17 0835 100 %     Weight --      Height --      Head Circumference --      Peak Flow --      Pain Score 09/04/17 0846 3     Pain Loc --      Pain Edu? --      Excl. in GC? --    No data found.  Updated Vital Signs BP 116/82 (BP Location: Right Arm)   Pulse 72   Temp 98.7 F (37.1 C) (Oral)   Resp 16   LMP 08/09/2017   SpO2 100%   Visual Acuity Right Eye Distance:   Left Eye Distance:   Bilateral Distance:    Right Eye Near:   Left Eye Near:    Bilateral Near:     Physical Exam  Constitutional: She is oriented to person, place, and time. She appears well-developed and well-nourished. No distress.  HENT:  Head: Normocephalic and atraumatic.  Right Ear: Tympanic membrane, external ear and ear canal normal. Tympanic membrane  is not erythematous and not bulging.  Left Ear: Tympanic membrane,  external ear and ear canal normal. Tympanic membrane is not erythematous and not bulging.  Nose: Right sinus exhibits frontal sinus tenderness. Right sinus exhibits no maxillary sinus tenderness. Left sinus exhibits frontal sinus tenderness. Left sinus exhibits no maxillary sinus tenderness.  Mouth/Throat: Uvula is midline, oropharynx is clear and moist and mucous membranes are normal.  Eyes: Pupils are equal, round, and reactive to light. Conjunctivae and EOM are normal.  Neck: Normal range of motion. Neck supple.  Cardiovascular: Normal rate, regular rhythm and normal heart sounds. Exam reveals no gallop and no friction rub.  No murmur heard. Pulmonary/Chest: Effort normal and breath sounds normal. No stridor. No respiratory distress. She has no decreased breath sounds. She has no wheezes. She has no rhonchi. She has no rales.  Abdominal: Soft. Bowel sounds are normal. She exhibits no mass. There is no tenderness. There is no rebound, no guarding and no CVA tenderness.  Musculoskeletal:  No tenderness to palpation of the spinous processes.  No tenderness to palpation of bilateral back.  No tenderness to palpation of the lower extremity.  Full range of motion of back, hip, knee, ankles.  Strength normal and equal bilaterally.  Sensation intact and equal bilaterally.  Patient able to distinguish between soft and sharp bilaterally.  Pedal pulse 2+ and equal bilaterally.  Intact patellar and Achilles reflex.  No swelling, erythema, increased warmth to the calf.  Negative Homans. R calf 18.5 in, L calf 18.25 in  Lymphadenopathy:    She has no cervical adenopathy.  Neurological: She is alert and oriented to person, place, and time. She has normal strength. She is not disoriented. No cranial nerve deficit or sensory deficit. She displays a negative Romberg sign. Coordination and gait normal. GCS eye subscore is 4. GCS verbal subscore is 5. GCS motor subscore is 6.  Normal finger-to-nose, rapid movement.   Patient able to ambulate on and off exam table without difficulty.  Skin: Skin is warm and dry.  Psychiatric: She has a normal mood and affect. Her behavior is normal. Judgment normal.     UC Treatments / Results  Labs (all labs ordered are listed, but only abnormal results are displayed) Labs Reviewed - No data to display  EKG None  Radiology No results found.  Procedures Procedures (including critical care time)  Medications Ordered in UC Medications - No data to display  Initial Impression / Assessment and Plan / UC Course  I have reviewed the triage vital signs and the nursing notes.  Pertinent labs & imaging results that were available during my care of the patient were reviewed by me and considered in my medical decision making (see chart for details).    Case discussed with Dr. Dayton Scrape.  No alarming signs on exam.  The patient with perceived numbness, tingling of the entire left lower extremity, on exam, sensation intact and equal bilaterally.  Able to distinguish between soft and sharp,  reflexes intact.  Given without swelling, erythema, increased warmth of the calf, negative Homans, low suspicion for DVT.  Will try Mobic for inflammation, return precautions given.  Otherwise follow-up with PCP for further evaluation if symptoms not improving.  Patient with intact neurology exam without focal deficits.  Some sinus pressure on exam, will try Flonase to help with symptoms.  Push fluids.  Return precautions given.  Patient expresses understanding and agrees to plan.  Final Clinical Impressions(s) / UC Diagnoses   Final diagnoses:  Left leg numbness  Lightheadedness    ED Prescriptions    Medication Sig Dispense Auth. Provider   meloxicam (MOBIC) 7.5 MG tablet Take 1 tablet (7.5 mg total) by mouth daily. 15 tablet Jamacia Jester V, PA-C   fluticasone (FLONASE) 50 MCG/ACT nasal spray Place 2 sprays into both nostrils daily. 1 g Threasa AlphaYu, Everado Pillsbury V, PA-C        Tymika Grilli V,  [place]PA-C 09/04/17 1820

## 2017-09-04 NOTE — ED Notes (Signed)
No difference in temperature of legs, no visible swelling or coloration difference, and pedal pulse present.   Notified Linward HeadlandAmy Yu, GeorgiaPA of patient complaint and history

## 2017-09-04 NOTE — ED Triage Notes (Signed)
Woke with left leg numbness.  Patient is lightheaded , nauseated and has diarrhea.  Reports numbness involving the entire left leg and foot.  Able to move extremity and foot.

## 2017-09-22 ENCOUNTER — Encounter (HOSPITAL_COMMUNITY): Payer: Self-pay | Admitting: Nurse Practitioner

## 2017-09-22 ENCOUNTER — Emergency Department (HOSPITAL_COMMUNITY)
Admission: EM | Admit: 2017-09-22 | Discharge: 2017-09-22 | Disposition: A | Discharge status: Home / Self Care | Payer: 59 | Attending: Emergency Medicine | Admitting: Emergency Medicine

## 2017-09-22 DIAGNOSIS — Z5321 Procedure and treatment not carried out due to patient leaving prior to being seen by health care provider: Secondary | ICD-10-CM | POA: Insufficient documentation

## 2017-09-22 DIAGNOSIS — M542 Cervicalgia: Secondary | ICD-10-CM | POA: Insufficient documentation

## 2017-09-22 NOTE — ED Triage Notes (Signed)
Pt is c/o sharp neck while at work, rating 9/10.

## 2017-09-22 NOTE — ED Notes (Signed)
Called for updated vitals, no answer.

## 2017-09-22 NOTE — ED Notes (Signed)
No answer when called for a room. 

## 2017-09-23 NOTE — ED Notes (Signed)
Follow up call made  No answer  09/23/17  0835   s Zaylon Bossier rn

## 2017-09-24 ENCOUNTER — Other Ambulatory Visit: Payer: Self-pay

## 2017-09-24 ENCOUNTER — Encounter (HOSPITAL_COMMUNITY): Payer: Self-pay | Admitting: Emergency Medicine

## 2017-09-24 DIAGNOSIS — Z79899 Other long term (current) drug therapy: Secondary | ICD-10-CM | POA: Insufficient documentation

## 2017-09-24 DIAGNOSIS — I10 Essential (primary) hypertension: Secondary | ICD-10-CM | POA: Insufficient documentation

## 2017-09-24 DIAGNOSIS — Z86711 Personal history of pulmonary embolism: Secondary | ICD-10-CM | POA: Diagnosis not present

## 2017-09-24 DIAGNOSIS — H538 Other visual disturbances: Secondary | ICD-10-CM | POA: Insufficient documentation

## 2017-09-24 DIAGNOSIS — R51 Headache: Secondary | ICD-10-CM | POA: Insufficient documentation

## 2017-09-24 NOTE — ED Triage Notes (Signed)
Patient c/o intermittent blurred vision to bilateral eyes since 2000 today. C/o constant headache x2 days. Denies drainage from eyes, weakness, slurred speech. Ambulatory. A&Ox4.

## 2017-09-25 ENCOUNTER — Emergency Department (HOSPITAL_COMMUNITY): Payer: 59

## 2017-09-25 ENCOUNTER — Encounter (HOSPITAL_COMMUNITY): Payer: Self-pay

## 2017-09-25 ENCOUNTER — Emergency Department (HOSPITAL_COMMUNITY)
Admission: EM | Admit: 2017-09-25 | Discharge: 2017-09-25 | Disposition: A | Discharge status: Home / Self Care | Payer: 59 | Attending: Emergency Medicine | Admitting: Emergency Medicine

## 2017-09-25 ENCOUNTER — Other Ambulatory Visit: Payer: Self-pay

## 2017-09-25 DIAGNOSIS — H538 Other visual disturbances: Secondary | ICD-10-CM

## 2017-09-25 LAB — I-STAT CHEM 8, ED
BUN: 4 mg/dL — ABNORMAL LOW (ref 6–20)
Calcium, Ion: 1.2 mmol/L (ref 1.15–1.40)
Chloride: 103 mmol/L (ref 98–111)
Creatinine, Ser: 0.6 mg/dL (ref 0.44–1.00)
GLUCOSE: 91 mg/dL (ref 70–99)
HCT: 36 % (ref 36.0–46.0)
Hemoglobin: 12.2 g/dL (ref 12.0–15.0)
Potassium: 3.9 mmol/L (ref 3.5–5.1)
Sodium: 138 mmol/L (ref 135–145)
TCO2: 25 mmol/L (ref 22–32)

## 2017-09-25 MED ORDER — ACETAMINOPHEN 325 MG PO TABS
650.0000 mg | ORAL_TABLET | Freq: Once | ORAL | Status: AC
  Administered 2017-09-25: 650 mg via ORAL
  Filled 2017-09-25: qty 2

## 2017-09-25 MED ORDER — IOPAMIDOL (ISOVUE-370) INJECTION 76%
INTRAVENOUS | Status: AC
  Filled 2017-09-25: qty 100

## 2017-09-25 MED ORDER — IOPAMIDOL (ISOVUE-370) INJECTION 76%
100.0000 mL | Freq: Once | INTRAVENOUS | Status: AC | PRN
  Administered 2017-09-25: 100 mL via INTRAVENOUS

## 2017-09-25 NOTE — ED Notes (Signed)
Patient verbalized understanding of discharge instructions, no questions. Patient ambulated out of ED with steady gait in no distress.  

## 2017-09-25 NOTE — ED Provider Notes (Addendum)
[facility]-EMERGENCY DEPT Provider Note   CSN: [id number] Arrival date & time: 09/24/17  2102     History   Chief Complaint Chief Complaint  Patient presents with  . Blurred Vision  . Headache    HPI Mindy Wade is a 37 y.o. female.  HPI 37 year old female comes in with chief complaint of blurry vision.  Patient states that she was watching TV, when all of a sudden she started having bilateral blurry vision that lasted for about 30 to 40 seconds.  Patient denies any associated dizziness, lightheadedness, near fainting, focal numbness, focal weakness or eye pain.  Patient does not have any similar history in the past.  Patient does indicate that she is been having headaches intermittently for the last several days.  Headaches are at different positions, but also in the posterior region.  She also states that there is family history of brain aneurysm.  Patient has history of hypertension, hyperlipidemia and previous history of PE.  There is no family history of MS or any autoimmune conditions.  Past Medical History:  Diagnosis Date  . Breast feeding status of mother   . Hx of blood clots   . Hx of varicella   . Hyperlipemia   . Hypertension   . Pregnancy induced hypertension   . Pulmonary embolism [facility])     Patient Active Problem List   Diagnosis Date Noted  . Missed abortion with fetal demise before 20 completed weeks of gestation 05/03/2017  . Precipitous delivery 02/19/2015  . Postpartum care following vaginal delivery 02/19/2015  . Active labor at term 02/18/2015  . Pre-diabetes 05/14/2014  . Elevated LDL cholesterol level 05/14/2014  . Pulmonary embolism (HCC) 10/17/2013  . Labor and delivery indication for care or intervention 10/09/2013  . Spontaneous abortion in second trimester 10/09/2013  . SVD (spontaneous vaginal delivery) 03/02/2013    Past Surgical History:  Procedure Laterality Date  . CHOLECYSTECTOMY       OB History    Gravida  8   Para  7   Term  6   Preterm  1   AB  1   Living  6     SAB  1   TAB      Ectopic      Multiple  0   Live Births  7            Home Medications    Prior to Admission medications   Medication Sig Start Date End Date Taking? Authorizing Provider  amLODipine (NORVASC) 10 MG tablet Take 10 mg by mouth daily.    [provider]  famotidine (PEPCID) 20 MG tablet Take 1 tablet (20 mg total) by mouth 2 (two) times daily. Patient taking differently: Take 20 mg by mouth 2 (two) times daily as needed for heartburn.  07/03/17   Terrilee Files, MD  FLUoxetine (PROZAC) 10 MG capsule Take 10 mg by mouth daily. 07/08/17   [provider]  fluticasone (FLONASE) 50 MCG/ACT nasal spray Place 2 sprays into both nostrils daily. 09/04/17   Cathie Hoops, Amy V, PA-C  labetalol (NORMODYNE) 200 MG tablet Take 200 mg by mouth 2 (two) times daily. Reported on 05/02/2015    [provider]  meloxicam (MOBIC) 7.5 MG tablet Take 1 tablet (7.5 mg total) by mouth daily. 09/04/17   Cathie Hoops, Amy V, PA-C  Multiple Vitamins-Minerals (MULTI-DAY PLUS MINERALS PO) Take 1 tablet by mouth daily.    [provider]  NORLYDA 0.35 MG tablet  TAKE 1 TABLET BY MOUTH DAILY 02/08/17   [provider]  rosuvastatin (CRESTOR) 10 MG tablet Take 10 mg by mouth at bedtime.     [provider]  spironolactone (ALDACTONE) 25 MG tablet Take 25 mg by mouth daily.    [provider]    Family History Family History  Problem Relation Age of Onset  . Asthma Mother   . Heart disease Mother   . Hypertension Mother   . Stroke Mother   . Cancer Father        prostate  . Alcohol abuse Neg Hx   . Arthritis Neg Hx   . Birth defects Neg Hx   . COPD Neg Hx   . Depression Neg Hx   . Diabetes Neg Hx   . Drug abuse Neg Hx   . Early death Neg Hx   . Hearing loss Neg Hx   . Hyperlipidemia Neg Hx   . Kidney disease Neg Hx   . Learning disabilities Neg Hx   . Mental  illness Neg Hx   . Mental retardation Neg Hx   . Miscarriages / Stillbirths Neg Hx   . Vision loss Neg Hx   . Varicose Veins Neg Hx     Social History Social History   Tobacco Use  . Smoking status: Never Smoker  . Smokeless tobacco: Never Used  Substance Use Topics  . Alcohol use: No  . Drug use: No     Allergies   Penicillins   Review of Systems Review of Systems  Constitutional: Positive for activity change.  Eyes: Positive for visual disturbance. Negative for pain.  Allergic/Immunologic: Negative for immunocompromised state.  Neurological: Positive for headaches.  Hematological: Does not bruise/bleed easily.  All other systems reviewed and are negative.    Physical Exam Updated Vital Signs BP 117/72 (BP Location: Left Arm)   Pulse 60   Temp 98 F (36.7 C) (Oral)   Resp 18   Ht 5\' 8"  (1.727 m)   Wt 113.9 kg (251 lb 1.6 oz)   LMP 08/09/2017   SpO2 98%   BMI 38.18 kg/m   Physical Exam  Constitutional: She is oriented to person, place, and time. She appears well-developed.  HENT:  Head: Normocephalic and atraumatic.  Eyes: Pupils are equal, round, and reactive to light. EOM are normal. Right eye exhibits no nystagmus. Left eye exhibits no nystagmus. Pupils are equal.  Neck: Normal range of motion. Neck supple.  Cardiovascular: Normal rate.  Pulmonary/Chest: Effort normal.  Abdominal: Bowel sounds are normal.  Neurological: She is alert and oriented to person, place, and time. She is not disoriented. No cranial nerve deficit.  Cerebellar exam is normal (finger to nose) Sensory exam normal for bilateral upper and lower extremities - and patient is able to discriminate between sharp and dull. Motor exam is 4+/5   Skin: Skin is warm and dry.  Nursing note and vitals reviewed.    ED Treatments / Results  Labs (all labs ordered are listed, but only abnormal results are displayed) Labs Reviewed  I-STAT CHEM 8, ED - Abnormal; Notable for the following  components:      Result Value   BUN 4 (*)    All other components within normal limits    EKG None  Radiology Ct Angio Head W Or Wo Contrast  Result Date: 09/25/2017 CLINICAL DATA:  Headache for 2 days. EXAM: CT ANGIOGRAPHY HEAD TECHNIQUE: Multidetector CT imaging of the head was performed using the standard protocol  during bolus administration of intravenous contrast. Multiplanar CT image reconstructions and MIPs were obtained to evaluate the vascular anatomy. CONTRAST:  ISOVUE-370 IOPAMIDOL (ISOVUE-370) INJECTION 76% COMPARISON:  06/18/2017 FINDINGS: CT HEAD Brain: No evidence of acute infarction, hemorrhage, hydrocephalus, extra-axial collection or mass lesion/mass effect. Mild patchy low-density in the cerebral white matter which appears stable. Vascular: See below Skull: Negative Sinuses: Smooth opacity in the left maxillary sinus, favor retention cyst. No active sinusitis. Orbits: Negative CTA HEAD Occasional obscuration of branches due to venous contamination. Anterior circulation: No branch occlusion, atheromatous change, beading, stenosis, or aneurysm. Posterior circulation: Left vertebral artery dominance. Vertebral and basilar arteries are smooth and widely patent. No branch occlusion or stenosis. Venous sinuses: Patent Anatomic variants: None significant Delayed phase: No abnormal intracranial enhancement IMPRESSION: 1. No acute finding.  Unremarkable vessels. 2. Few small remote white matter insults. These are nonspecific, history of hypertension favors premature chronic small vessel disease. Electronically Signed   By: Marnee Spring M.D.   On: 09/25/2017 06:59    Procedures Procedures (including critical care time)  Medications Ordered in ED Medications  iopamidol (ISOVUE-370) 76 % injection (has no administration in time range)  acetaminophen (TYLENOL) tablet 650 mg (650 mg Oral Given 09/25/17 0511)  iopamidol (ISOVUE-370) 76 % injection 100 mL (100 mLs Intravenous  Contrast Given 09/25/17 1610)     Initial Impression / Assessment and Plan / ED Course  I have reviewed the triage vital signs and the nursing notes.  Pertinent labs & imaging results that were available during my care of the patient were reviewed by me and considered in my medical decision making (see chart for details).     37 year old female comes in with chief complaint of blurred vision and headaches.  Patient has family history of brain aneurysm.  She herself has history of diabetes.  On exam she has no focal neurologic deficits.  Patient's vision change was bilateral, which makes TIA less likely.  She has been having posterior headaches however, and the concern would be that she has aneurysm that might have led to her feeling that way.  There is no neck pain or stiffness.  We will order CT angiogram brain to rule out any brain aneurysm.  Chem-8 has been ordered.  If results are normal then we will advised patient to follow-up with her PCP. Patient also reports vague episodes of intermittent episodes of numbness at other sites which makes [place] think of MS as potential etiology as well -but as I discussed with the patient that is not an emergent diagnosis, since she is symptom-free at the moment.  Final Clinical Impressions(s) / ED Diagnoses   Final diagnoses:  Blurred vision, bilateral    ED Discharge Orders    None       Derwood Kaplan, MD 09/25/17 De Burrs, MD 09/25/17 [phone]

## 2017-09-25 NOTE — Discharge Instructions (Addendum)
We signed the ER for headaches and blurry vision. Given that you have family history of brain aneurysm we did get a CT angiogram of the brain, and there is no evidence of brain aneurysm.  Please see your doctor for further evaluation.

## 2017-10-09 ENCOUNTER — Emergency Department (HOSPITAL_COMMUNITY)
Admission: EM | Admit: 2017-10-09 | Discharge: 2017-10-10 | Disposition: A | Discharge status: Home / Self Care | Payer: 59 | Attending: Emergency Medicine | Admitting: Emergency Medicine

## 2017-10-09 ENCOUNTER — Emergency Department (HOSPITAL_COMMUNITY): Payer: 59

## 2017-10-09 ENCOUNTER — Encounter (HOSPITAL_COMMUNITY): Payer: Self-pay | Admitting: Emergency Medicine

## 2017-10-09 DIAGNOSIS — I10 Essential (primary) hypertension: Secondary | ICD-10-CM | POA: Diagnosis not present

## 2017-10-09 DIAGNOSIS — Z3A09 9 weeks gestation of pregnancy: Secondary | ICD-10-CM | POA: Diagnosis not present

## 2017-10-09 DIAGNOSIS — O021 Missed abortion: Secondary | ICD-10-CM | POA: Diagnosis not present

## 2017-10-09 DIAGNOSIS — O10011 Pre-existing essential hypertension complicating pregnancy, first trimester: Secondary | ICD-10-CM | POA: Diagnosis not present

## 2017-10-09 DIAGNOSIS — R51 Headache: Secondary | ICD-10-CM | POA: Diagnosis not present

## 2017-10-09 DIAGNOSIS — R072 Precordial pain: Secondary | ICD-10-CM | POA: Diagnosis not present

## 2017-10-09 DIAGNOSIS — O9989 Other specified diseases and conditions complicating pregnancy, childbirth and the puerperium: Secondary | ICD-10-CM | POA: Diagnosis not present

## 2017-10-09 DIAGNOSIS — Z79899 Other long term (current) drug therapy: Secondary | ICD-10-CM | POA: Insufficient documentation

## 2017-10-09 LAB — CBC
HCT: 33.6 % — ABNORMAL LOW (ref 36.0–46.0)
Hemoglobin: 10.9 g/dL — ABNORMAL LOW (ref 12.0–15.0)
MCH: 29.3 pg (ref 26.0–34.0)
MCHC: 32.4 g/dL (ref 30.0–36.0)
MCV: 90.3 fL (ref 78.0–100.0)
Platelets: 286 10*3/uL (ref 150–400)
RBC: 3.72 MIL/uL — ABNORMAL LOW (ref 3.87–5.11)
RDW: 15.8 % — AB (ref 11.5–15.5)
WBC: 9.1 10*3/uL (ref 4.0–10.5)

## 2017-10-09 LAB — I-STAT BETA HCG BLOOD, ED (MC, WL, AP ONLY)

## 2017-10-09 LAB — I-STAT TROPONIN, ED: Troponin i, poc: 0.01 ng/mL (ref 0.00–0.08)

## 2017-10-09 NOTE — ED Triage Notes (Signed)
Pt reports central/L sided CP with radiation to L arm onset 9PM. Sharp/tight in nature, 4/10. Denies SOB, N/V, dizziness/lightheadedness. Pt states she called 911, EMS came out and did EKG, showed ST HR 130's. Pt is [redacted] weeks pregnant, EDD 05/16/18. Pt takes Lovenox for hx of DVT.

## 2017-10-10 LAB — I-STAT TROPONIN, ED: Troponin i, poc: 0.01 ng/mL (ref 0.00–0.08)

## 2017-10-10 LAB — BASIC METABOLIC PANEL
Anion gap: 10 (ref 5–15)
BUN: 8 mg/dL (ref 6–20)
CO2: 19 mmol/L — ABNORMAL LOW (ref 22–32)
Calcium: 9.3 mg/dL (ref 8.9–10.3)
Chloride: 107 mmol/L (ref 98–111)
Creatinine, Ser: 0.63 mg/dL (ref 0.44–1.00)
GFR calc Af Amer: >60 mL/min (ref >60)
Glucose, Bld: 90 mg/dL (ref 70–99)
Potassium: 3.7 mmol/L (ref 3.5–5.1)
SODIUM: 136 mmol/L (ref 135–145)

## 2017-10-10 MED ORDER — CALCIUM CARBONATE ANTACID 500 MG PO CHEW
1.0000 | CHEWABLE_TABLET | Freq: Once | ORAL | Status: AC
Start: 2017-10-10 — End: 2017-10-10
  Administered 2017-10-10: 200 mg via ORAL
  Filled 2017-10-10: qty 1

## 2017-10-10 NOTE — ED Provider Notes (Signed)
[facility] EMERGENCY DEPARTMENT Provider Note   CSN: [id number] Arrival date & time: 10/09/17  2307     History   Chief Complaint Chief Complaint  Patient presents with  . Chest Pain    HPI Mindy Wade is a 37 y.o. female.  The history is provided by the patient.  Chest Pain   This is a new problem. The current episode started 3 to 5 hours ago. The problem occurs constantly. The problem has been resolved. The pain is associated with rest and eating. The pain is present in the substernal region. The pain is at a severity of 4/10. The pain is moderate. The quality of the pain is described as dull. The pain does not radiate. Pertinent negatives include no abdominal pain, no cough, no diaphoresis, no dizziness, no fever, no headaches, no leg pain, no lower extremity edema, no malaise/fatigue, no nausea, no orthopnea, no palpitations, no PND, no shortness of breath and no sputum production. She has tried nothing for the symptoms.    Past Medical History:  Diagnosis Date  . Breast feeding status of mother   . Hx of blood clots   . Hx of varicella   . Hyperlipemia   . Hypertension   . Pregnancy induced hypertension   . Pulmonary embolism [facility])     Patient Active Problem List   Diagnosis Date Noted  . Missed abortion with fetal demise before 20 completed weeks of gestation 05/03/2017  . Precipitous delivery 02/19/2015  . Postpartum care following vaginal delivery 02/19/2015  . Active labor at term 02/18/2015  . Pre-diabetes 05/14/2014  . Elevated LDL cholesterol level 05/14/2014  . Pulmonary embolism (HCC) 10/17/2013  . Labor and delivery indication for care or intervention 10/09/2013  . Spontaneous abortion in second trimester 10/09/2013  . SVD (spontaneous vaginal delivery) 03/02/2013    Past Surgical History:  Procedure Laterality Date  . CHOLECYSTECTOMY       OB History    Gravida  9   Para  7   Term  6   Preterm  1   AB  1   Living   6     SAB  1   TAB      Ectopic      Multiple  0   Live Births  7            Home Medications    Prior to Admission medications   Medication Sig Start Date End Date Taking? Authorizing Provider  enoxaparin (LOVENOX) 40 MG/0.4ML injection Inject 40 mg into the skin daily.   Yes [provider]  FLUoxetine (PROZAC) 10 MG capsule Take 10 mg by mouth daily. 07/08/17  Yes [provider]  labetalol (NORMODYNE) 200 MG tablet Take 200 mg by mouth 2 (two) times daily. Reported on 05/02/2015   Yes [provider]  Prenatal Vit-Fe Fumarate-FA (PRENATAL MULTIVITAMIN) TABS tablet Take 1 tablet by mouth daily at 12 noon.   Yes [provider]  famotidine (PEPCID) 20 MG tablet Take 1 tablet (20 mg total) by mouth 2 (two) times daily. Patient not taking: Reported on 10/10/2017 07/03/17   Terrilee Files, MD  fluticasone [facility]) 50 MCG/ACT nasal spray Place 2 sprays into both nostrils daily. Patient not taking: Reported on 10/10/2017 09/04/17   Belinda Fisher, PA-C  meloxicam (MOBIC) 7.5 MG tablet Take 1 tablet (7.5 mg total) by mouth daily. Patient not taking: Reported on 10/10/2017 09/04/17   Belinda Fisher, PA-C  Family History Family History  Problem Relation Age of Onset  . Asthma Mother   . Heart disease Mother   . Hypertension Mother   . Stroke Mother   . Cancer Father        prostate  . Alcohol abuse Neg Hx   . Arthritis Neg Hx   . Birth defects Neg Hx   . COPD Neg Hx   . Depression Neg Hx   . Diabetes Neg Hx   . Drug abuse Neg Hx   . Early death Neg Hx   . Hearing loss Neg Hx   . Hyperlipidemia Neg Hx   . Kidney disease Neg Hx   . Learning disabilities Neg Hx   . Mental illness Neg Hx   . Mental retardation Neg Hx   . Miscarriages / Stillbirths Neg Hx   . Vision loss Neg Hx   . Varicose Veins Neg Hx     Social History Social History   Tobacco Use  . Smoking status: Never Smoker  . Smokeless tobacco: Never Used  Substance Use Topics    . Alcohol use: No  . Drug use: No     Allergies   Penicillins   Review of Systems Review of Systems  Constitutional: Negative for chills, diaphoresis, fatigue, fever and malaise/fatigue.  Eyes: Negative for photophobia and visual disturbance.  Respiratory: Negative for cough, sputum production, choking, chest tightness, shortness of breath and wheezing.   Cardiovascular: Positive for chest pain. Negative for palpitations, orthopnea, leg swelling and PND.  Gastrointestinal: Negative for abdominal pain and nausea.  Genitourinary: Negative for dysuria, enuresis, flank pain and frequency.  Musculoskeletal: Negative for arthralgias.  Neurological: Negative for dizziness, facial asymmetry and headaches.  All other systems reviewed and are negative.    Physical Exam Updated Vital Signs BP 124/77   Pulse 72   Temp 98.6 F (37 C) (Oral)   Resp 16   Ht 5\' 8"  (1.727 m)   Wt 113.4 kg   LMP 08/09/2017   SpO2 99%   BMI 38.01 kg/m   Physical Exam  Constitutional: She is oriented to person, place, and time. She appears well-developed and well-nourished. No distress.  HENT:  Head: Normocephalic and atraumatic.  Mouth/Throat: No oropharyngeal exudate.  Eyes: Pupils are equal, round, and reactive to light. Conjunctivae and EOM are normal.  Neck: Normal range of motion. Neck supple.  Cardiovascular: Normal rate, regular rhythm, normal heart sounds and intact distal pulses.  Pulmonary/Chest: Effort normal and breath sounds normal. No stridor. No respiratory distress. She has no wheezes. She has no rales.  Abdominal: Soft. Bowel sounds are normal. She exhibits no distension and no mass. There is no tenderness. There is no rebound and no guarding.  Musculoskeletal: Normal range of motion. She exhibits no edema, tenderness or deformity.  Neurological: She is alert and oriented to person, place, and time.  Skin: Skin is warm and dry. Capillary refill takes less than 2 seconds.   Psychiatric: She has a normal mood and affect.     ED Treatments / Results  Labs (all labs ordered are listed, but only abnormal results are displayed) Results for orders placed or performed during the hospital encounter of 10/09/17  Basic metabolic panel  Result Value Ref Range   Sodium 136 135 - 145 mmol/L   Potassium 3.7 3.5 - 5.1 mmol/L   Chloride 107 98 - 111 mmol/L   CO2 19 (L) 22 - 32 mmol/L   Glucose, Bld 90 70 - 99 mg/dL  BUN 8 6 - 20 mg/dL   Creatinine, Ser 1.610.63 0.44 - 1.00 mg/dL   Calcium 9.3 8.9 - 09.610.3 mg/dL   GFR calc non Af Amer >60 >60 mL/min   GFR calc Af Amer >60 >60 mL/min   Anion gap 10 5 - 15  CBC  Result Value Ref Range   WBC 9.1 4.0 - 10.5 K/uL   RBC 3.72 (L) 3.87 - 5.11 MIL/uL   Hemoglobin 10.9 (L) 12.0 - 15.0 g/dL   HCT 04.533.6 (L) 40.936.0 - 81.146.0 %   MCV 90.3 78.0 - 100.0 fL   MCH 29.3 26.0 - 34.0 pg   MCHC 32.4 30.0 - 36.0 g/dL   RDW 91.415.8 (H) 78.211.5 - 95.615.5 %   Platelets 286 150 - 400 K/uL  I-stat troponin, ED  Result Value Ref Range   Troponin i, poc 0.01 0.00 - 0.08 ng/mL   Comment 3          I-Stat beta hCG blood, ED  Result Value Ref Range   I-stat hCG, quantitative >2,000.0 (H) <5 mIU/mL   Comment 3          I-stat troponin, ED  Result Value Ref Range   Troponin i, poc 0.01 0.00 - 0.08 ng/mL   Comment 3           Ct Angio Head W Or Wo Contrast  Result Date: 09/25/2017 CLINICAL DATA:  Headache for 2 days. EXAM: CT ANGIOGRAPHY HEAD TECHNIQUE: Multidetector CT imaging of the head was performed using the standard protocol during bolus administration of intravenous contrast. Multiplanar CT image reconstructions and MIPs were obtained to evaluate the vascular anatomy. CONTRAST:  100mL ISOVUE-370 IOPAMIDOL (ISOVUE-370) INJECTION 76% COMPARISON:  06/18/2017 FINDINGS: CT HEAD Brain: No evidence of acute infarction, hemorrhage, hydrocephalus, extra-axial collection or mass lesion/mass effect. Mild patchy low-density in the cerebral white matter which  appears stable. Vascular: See below Skull: Negative Sinuses: Smooth opacity in the left maxillary sinus, favor retention cyst. No active sinusitis. Orbits: Negative CTA HEAD Occasional obscuration of branches due to venous contamination. Anterior circulation: No branch occlusion, atheromatous change, beading, stenosis, or aneurysm. Posterior circulation: Left vertebral artery dominance. Vertebral and basilar arteries are smooth and widely patent. No branch occlusion or stenosis. Venous sinuses: Patent Anatomic variants: None significant Delayed phase: No abnormal intracranial enhancement IMPRESSION: 1. No acute finding.  Unremarkable vessels. 2. Few small remote white matter insults. These are nonspecific, history of hypertension favors premature chronic small vessel disease. Electronically Signed   By: Marnee SpringJonathon  Watts M.D.   On: 09/25/2017 06:59   Dg Chest 2 View  Result Date: 10/09/2017 CLINICAL DATA:  Mid chest pain beginning tonight.  Pregnant. EXAM: CHEST - 2 VIEW COMPARISON:  Chest radiograph June 21st 1,019 and CT angiogram of the chest August 16, 2017. FINDINGS: Cardiomediastinal silhouette is normal. Mild bronchitic changes potentially accentuated from breast attenuation. No pleural effusions or focal consolidations. Trachea projects midline and there is no pneumothorax. Soft tissue planes and included osseous structures are non-suspicious. IMPRESSION: Mild bronchitic changes, potentially accentuated RIGHT breast attenuation. No focal consolidation. Electronically Signed   By: Awilda Metroourtnay  Bloomer M.D.   On: 10/09/2017 23:44    EKG EKG Interpretation  Date/Time:  Wednesday October 09 2017 23:12:51 EDT Ventricular Rate:  90 PR Interval:  150 QRS Duration: 84 QT Interval:  348 QTC Calculation: 425 R Axis:   58 Text Interpretation:  Normal sinus rhythm Confirmed by Nicanor AlconPalumbo, Marcelles Clinard ([id number]) on 10/09/2017 11:38:06 PM   Radiology Dg Chest  2 View  Result Date: 10/09/2017 CLINICAL DATA:  Mid chest  pain beginning tonight.  Pregnant. EXAM: CHEST - 2 VIEW COMPARISON:  Chest radiograph June 21st 1,019 and CT angiogram of the chest August 16, 2017. FINDINGS: Cardiomediastinal silhouette is normal. Mild bronchitic changes potentially accentuated from breast attenuation. No pleural effusions or focal consolidations. Trachea projects midline and there is no pneumothorax. Soft tissue planes and included osseous structures are non-suspicious. IMPRESSION: Mild bronchitic changes, potentially accentuated RIGHT breast attenuation. No focal consolidation. Electronically Signed   By: Awilda Metro M.D.   On: 10/09/2017 23:44    Procedures Procedures (including critical care time)  Medications Ordered in ED Medications  calcium carbonate (TUMS - dosed in mg elemental calcium) chewable tablet 200 mg of elemental calcium (200 mg of elemental calcium Oral Given 10/10/17 0346)       Final Clinical Impressions(s) / ED Diagnoses   Ruled out for MI in the ED with 2 negative troponins, heart score 1 very low risk for MI.  Moreover, symptoms are not consistent with PE and patient is on BID therapeutic lovenox making that diagnosis very unlikely, especially with normal respiratory rate heart rate and saturating 100% on room air.  She has missed no doses of medication and the risks of CT (repeat) to this patient and the fetus outweigh the benefits since she is on the treatment of choice.    Return for numbness, changes in vision or speech, fevers >100.4 unrelieved by medication, shortness of breath, intractable vomiting, or diarrhea, abdominal pain, Inability to tolerate liquids or food, cough, altered mental status or any concerns. No signs of systemic illness or infection. The patient is nontoxic-appearing on exam and vital signs are within normal limits. Will refer to urology for microscopy hematuria as patient is asymptomatic.  I have reviewed the triage vital signs and the nursing notes. Pertinent labs  &imaging results that were available during my care of the patient were reviewed by me and considered in my medical decision making (see chart for details).  After history, exam, and medical workup I feel the patient has been appropriately medically screened and is safe for discharge home. Pertinent diagnoses were discussed with the patient. Patient was given return precautions.      Deltha Bernales, MD 10/10/17 [phone]

## 2017-10-29 LAB — OB RESULTS CONSOLE HEPATITIS B SURFACE ANTIGEN: HEP B S AG: NEGATIVE

## 2017-10-29 LAB — OB RESULTS CONSOLE RUBELLA ANTIBODY, IGM: Rubella: IMMUNE

## 2017-10-29 LAB — OB RESULTS CONSOLE ABO/RH: RH Type: POSITIVE

## 2017-10-29 LAB — OB RESULTS CONSOLE HIV ANTIBODY (ROUTINE TESTING): HIV: NONREACTIVE

## 2017-10-29 LAB — OB RESULTS CONSOLE ANTIBODY SCREEN: Antibody Screen: POSITIVE

## 2017-10-29 LAB — OB RESULTS CONSOLE GC/CHLAMYDIA
Chlamydia: NEGATIVE
Gonorrhea: NEGATIVE

## 2017-10-29 LAB — OB RESULTS CONSOLE RPR: RPR: NONREACTIVE

## 2017-10-31 ENCOUNTER — Ambulatory Visit (HOSPITAL_COMMUNITY)
Admission: EM | Admit: 2017-10-31 | Discharge: 2017-10-31 | Disposition: A | Discharge status: Home / Self Care | Payer: 59 | Attending: Family Medicine | Admitting: Family Medicine

## 2017-10-31 ENCOUNTER — Other Ambulatory Visit: Payer: Self-pay

## 2017-10-31 ENCOUNTER — Encounter (HOSPITAL_COMMUNITY): Payer: Self-pay | Admitting: *Deleted

## 2017-10-31 DIAGNOSIS — G44209 Tension-type headache, unspecified, not intractable: Secondary | ICD-10-CM | POA: Diagnosis not present

## 2017-10-31 NOTE — ED Provider Notes (Signed)
[facility]    CSN: [id number] Arrival date & time: 10/31/17  1801     History   Chief Complaint Chief Complaint  Patient presents with  . Back Pain    HPI Mindy Wade is a 37 y.o. female.  Patient complains of upper back and neck pain.  Pain not usually there in the mornings but worsens as the day goes on.  She is pregnant and has several kids.  She would endorse some increased stress and tension lately.  Tylenol seems to help some but then wears off.   HPI  Past Medical History:  Diagnosis Date  . Breast feeding status of mother   . Hx of blood clots   . Hx of varicella   . Hyperlipemia   . Hypertension   . Pregnancy induced hypertension   . Pulmonary embolism [facility])     Patient Active Problem List   Diagnosis Date Noted  . Missed abortion with fetal demise before 20 completed weeks of gestation 05/03/2017  . Precipitous delivery 02/19/2015  . Postpartum care following vaginal delivery 02/19/2015  . Active labor at term 02/18/2015  . Pre-diabetes 05/14/2014  . Elevated LDL cholesterol level 05/14/2014  . Pulmonary embolism (HCC) 10/17/2013  . Labor and delivery indication for care or intervention 10/09/2013  . Spontaneous abortion in second trimester 10/09/2013  . SVD (spontaneous vaginal delivery) 03/02/2013    Past Surgical History:  Procedure Laterality Date  . CHOLECYSTECTOMY      OB History    Gravida  9   Para  7   Term  6   Preterm  1   AB  1   Living  6     SAB  1   TAB      Ectopic      Multiple  0   Live Births  7            Home Medications    Prior to Admission medications   Medication Sig Start Date End Date Taking? Authorizing Provider  enoxaparin (LOVENOX) 40 MG/0.4ML injection Inject 40 mg into the skin daily.    [provider]  famotidine (PEPCID) 20 MG tablet Take 1 tablet (20 mg total) by mouth 2 (two) times daily. Patient not taking: Reported on 10/10/2017 07/03/17   Terrilee Files, MD   FLUoxetine (PROZAC) 10 MG capsule Take 10 mg by mouth daily. 07/08/17   [provider]  fluticasone (FLONASE) 50 MCG/ACT nasal spray Place 2 sprays into both nostrils daily. Patient not taking: Reported on 10/10/2017 09/04/17   Belinda Fisher, PA-C  labetalol (NORMODYNE) 200 MG tablet Take 200 mg by mouth 2 (two) times daily. Reported on 05/02/2015    [provider]  meloxicam (MOBIC) 7.5 MG tablet Take 1 tablet (7.5 mg total) by mouth daily. Patient not taking: Reported on 10/10/2017 09/04/17   Belinda Fisher, PA-C  Prenatal Vit-Fe Fumarate-FA (PRENATAL MULTIVITAMIN) TABS tablet Take 1 tablet by mouth daily at 12 noon.    [provider]    Family History Family History  Problem Relation Age of Onset  . Asthma Mother   . Heart disease Mother   . Hypertension Mother   . Stroke Mother   . Cancer Father        prostate  . Alcohol abuse Neg Hx   . Arthritis Neg Hx   . Birth defects Neg Hx   . COPD Neg Hx   . Depression Neg Hx   .  Diabetes Neg Hx   . Drug abuse Neg Hx   . Early death Neg Hx   . Hearing loss Neg Hx   . Hyperlipidemia Neg Hx   . Kidney disease Neg Hx   . Learning disabilities Neg Hx   . Mental illness Neg Hx   . Mental retardation Neg Hx   . Miscarriages / Stillbirths Neg Hx   . Vision loss Neg Hx   . Varicose Veins Neg Hx     Social History Social History   Tobacco Use  . Smoking status: Never Smoker  . Smokeless tobacco: Never Used  Substance Use Topics  . Alcohol use: No  . Drug use: No     Allergies   Penicillins   Review of Systems Review of Systems  Constitutional: Negative.   Musculoskeletal: Positive for myalgias and neck pain.  Neurological: Positive for headaches.     Physical Exam Triage Vital Signs ED Triage Vitals  Enc Vitals Group     BP 10/31/17 1829 113/73     Pulse Rate 10/31/17 1829 72     Resp 10/31/17 1829 18     Temp 10/31/17 1829 98.4 F (36.9 C)     Temp Source 10/31/17 1829 Oral     SpO2 10/31/17  1829 100 %     Weight --      Height --      Head Circumference --      Peak Flow --      Pain Score 10/31/17 1831 7     Pain Loc --      Pain Edu? --      Excl. in GC? --    No data found.  Updated Vital Signs BP 113/73 (BP Location: Right Arm)   Pulse 72   Temp 98.4 F (36.9 C) (Oral)   Resp 18   LMP 08/09/2017   SpO2 100%   Visual Acuity Right Eye Distance:   Left Eye Distance:   Bilateral Distance:    Right Eye Near:   Left Eye Near:    Bilateral Near:     Physical Exam  Constitutional: She is oriented to person, place, and time. She appears well-developed and well-nourished.  Eyes: Pupils are equal, round, and reactive to light. EOM are normal.  Cardiovascular: Normal rate and regular rhythm.  Pulmonary/Chest: Effort normal and breath sounds normal.  Neurological: She is alert and oriented to person, place, and time.     UC Treatments / Results  Labs (all labs ordered are listed, but only abnormal results are displayed) Labs Reviewed - No data to display  EKG None  Radiology No results found.  Procedures Procedures (including critical care time)  Medications Ordered in UC Medications - No data to display  Initial Impression / Assessment and Plan / UC Course  I have reviewed the triage vital signs and the nursing notes.  Pertinent labs & imaging results that were available during my care of the patient were reviewed by me and considered in my medical decision making (see chart for details).     Muscle contraction/tension headache.  Discussed some stretching exercises along with massage and continuing Tylenol. Final Clinical Impressions(s) / UC Diagnoses   Final diagnoses:  None   Discharge Instructions   None    ED Prescriptions    None     Controlled Substance Prescriptions [place] Controlled Substance Registry consulted? No   Frederica Kuster, MD 10/31/17 [phone]

## 2017-10-31 NOTE — ED Triage Notes (Signed)
C/o neck and back pain onset Tues. Denies injury, states she has been taking tylenol, she is 3 months preg.

## 2017-11-13 ENCOUNTER — Encounter (HOSPITAL_COMMUNITY): Payer: Self-pay

## 2017-11-13 ENCOUNTER — Other Ambulatory Visit: Payer: Self-pay

## 2017-11-13 ENCOUNTER — Emergency Department (HOSPITAL_COMMUNITY)
Admission: EM | Admit: 2017-11-13 | Discharge: 2017-11-14 | Disposition: A | Discharge status: Home / Self Care | Payer: 59 | Attending: Emergency Medicine | Admitting: Emergency Medicine

## 2017-11-13 DIAGNOSIS — R51 Headache: Secondary | ICD-10-CM | POA: Insufficient documentation

## 2017-11-13 DIAGNOSIS — Z79899 Other long term (current) drug therapy: Secondary | ICD-10-CM | POA: Insufficient documentation

## 2017-11-13 DIAGNOSIS — O26891 Other specified pregnancy related conditions, first trimester: Secondary | ICD-10-CM | POA: Insufficient documentation

## 2017-11-13 DIAGNOSIS — O131 Gestational [pregnancy-induced] hypertension without significant proteinuria, first trimester: Secondary | ICD-10-CM | POA: Diagnosis not present

## 2017-11-13 DIAGNOSIS — Z3A13 13 weeks gestation of pregnancy: Secondary | ICD-10-CM

## 2017-11-13 DIAGNOSIS — Z7901 Long term (current) use of anticoagulants: Secondary | ICD-10-CM | POA: Diagnosis not present

## 2017-11-13 DIAGNOSIS — Z3A12 12 weeks gestation of pregnancy: Secondary | ICD-10-CM | POA: Insufficient documentation

## 2017-11-13 DIAGNOSIS — R519 Headache, unspecified: Secondary | ICD-10-CM

## 2017-11-13 NOTE — ED Triage Notes (Signed)
Pt reports headaches x 3days. She also endorses nausea and dizziness today. A&Ox4. No neuro deficits.

## 2017-11-14 LAB — COMPREHENSIVE METABOLIC PANEL
ALBUMIN: 3.3 g/dL — AB (ref 3.5–5.0)
ALK PHOS: 43 U/L (ref 38–126)
ALT: 13 U/L (ref 0–44)
AST: 18 U/L (ref 15–41)
Anion gap: 9 (ref 5–15)
BUN: 9 mg/dL (ref 6–20)
CO2: 24 mmol/L (ref 22–32)
Calcium: 9.1 mg/dL (ref 8.9–10.3)
Chloride: 109 mmol/L (ref 98–111)
Creatinine, Ser: 0.59 mg/dL (ref 0.44–1.00)
GFR calc Af Amer: >60 mL/min (ref >60)
GFR calc non Af Amer: >60 mL/min (ref >60)
GLUCOSE: 83 mg/dL (ref 70–99)
POTASSIUM: 3.5 mmol/L (ref 3.5–5.1)
SODIUM: 142 mmol/L (ref 135–145)
Total Bilirubin: 0.2 mg/dL — ABNORMAL LOW (ref 0.3–1.2)
Total Protein: 7.3 g/dL (ref 6.5–8.1)

## 2017-11-14 LAB — CBC WITH DIFFERENTIAL/PLATELET
BASOS ABS: 0 10*3/uL (ref 0.0–0.1)
Basophils Relative: 0 %
EOS PCT: 2 %
Eosinophils Absolute: 0.1 10*3/uL (ref 0.0–0.7)
HCT: 32.8 % — ABNORMAL LOW (ref 36.0–46.0)
Hemoglobin: 11.1 g/dL — ABNORMAL LOW (ref 12.0–15.0)
LYMPHS PCT: 35 %
Lymphs Abs: 3 10*3/uL (ref 0.7–4.0)
MCH: 31.2 pg (ref 26.0–34.0)
MCHC: 33.8 g/dL (ref 30.0–36.0)
MCV: 92.1 fL (ref 78.0–100.0)
MONO ABS: 0.7 10*3/uL (ref 0.1–1.0)
MONOS PCT: 8 %
Neutro Abs: 4.8 10*3/uL (ref 1.7–7.7)
Neutrophils Relative %: 55 %
PLATELETS: 318 10*3/uL (ref 150–400)
RBC: 3.56 MIL/uL — ABNORMAL LOW (ref 3.87–5.11)
RDW: 15.1 % (ref 11.5–15.5)
WBC: 8.5 10*3/uL (ref 4.0–10.5)

## 2017-11-14 LAB — URINALYSIS, ROUTINE W REFLEX MICROSCOPIC
BILIRUBIN URINE: NEGATIVE
Glucose, UA: NEGATIVE mg/dL
HGB URINE DIPSTICK: NEGATIVE
KETONES UR: NEGATIVE mg/dL
Leukocytes, UA: NEGATIVE
Nitrite: NEGATIVE
PROTEIN: NEGATIVE mg/dL
Specific Gravity, Urine: 1.012 (ref 1.005–1.030)
pH: 6 (ref 5.0–8.0)

## 2017-11-14 NOTE — ED Provider Notes (Signed)
[facility]-EMERGENCY DEPT Provider Note   CSN: [id number] Arrival date & time: 11/13/17  2124     History   Chief Complaint Chief Complaint  Patient presents with  . Headache    3 months pregnant    HPI Mindy Wade is a 37 y.o. female.  Patient is a 37 year old female pregnant at approximately 3 months gestation.  She presents today for evaluation of headache.  This is been ongoing for the past several days.  She reports pressure behind both eyes, but denies any visual disturbances, nausea, numbness, or weakness.  This began in the absence of any injury or trauma.  She does have a history of headaches and several months ago had a CT angiogram of the head showing no aneurysms or other pathology.  The history is provided by the patient.  Headache   This is a recurrent problem. Episode onset: 3 days ago. The problem occurs constantly. The problem has been gradually worsening. The headache is associated with nothing. The pain is located in the frontal region. The quality of the pain is described as throbbing. The pain is moderate. The pain does not radiate. Pertinent negatives include no fever, no nausea and no vomiting. She has tried acetaminophen for the symptoms. The treatment provided no relief.    Past Medical History:  Diagnosis Date  . Breast feeding status of mother   . Hx of blood clots   . Hx of varicella   . Hyperlipemia   . Hypertension   . Pregnancy induced hypertension   . Pulmonary embolism [facility])     Patient Active Problem List   Diagnosis Date Noted  . Missed abortion with fetal demise before 20 completed weeks of gestation 05/03/2017  . Precipitous delivery 02/19/2015  . Postpartum care following vaginal delivery 02/19/2015  . Active labor at term 02/18/2015  . Pre-diabetes 05/14/2014  . Elevated LDL cholesterol level 05/14/2014  . Pulmonary embolism (HCC) 10/17/2013  . Labor and delivery indication for care or intervention  10/09/2013  . Spontaneous abortion in second trimester 10/09/2013  . SVD (spontaneous vaginal delivery) 03/02/2013    Past Surgical History:  Procedure Laterality Date  . CHOLECYSTECTOMY       OB History    Gravida  9   Para  7   Term  6   Preterm  1   AB  1   Living  6     SAB  1   TAB      Ectopic      Multiple  0   Live Births  7            Home Medications    Prior to Admission medications   Medication Sig Start Date End Date Taking? Authorizing Provider  enoxaparin (LOVENOX) 40 MG/0.4ML injection Inject 40 mg into the skin daily.   Yes [provider]  labetalol (NORMODYNE) 200 MG tablet Take 200 mg by mouth 2 (two) times daily. Reported on 05/02/2015   Yes [provider]  Prenatal Vit-Fe Fumarate-FA (PRENATAL MULTIVITAMIN) TABS tablet Take 1 tablet by mouth daily at 12 noon.   Yes [provider]  famotidine (PEPCID) 20 MG tablet Take 1 tablet (20 mg total) by mouth 2 (two) times daily. Patient not taking: Reported on 10/10/2017 07/03/17   Terrilee Files, MD  fluticasone [facility]) 50 MCG/ACT nasal spray Place 2 sprays into both nostrils daily. Patient not taking: Reported on 10/10/2017 09/04/17   Belinda Fisher, PA-C  meloxicam (  MOBIC) 7.5 MG tablet Take 1 tablet (7.5 mg total) by mouth daily. Patient not taking: Reported on 10/10/2017 09/04/17   Lurline IdolYu, Amy V, PA-C    Family History Family History  Problem Relation Age of Onset  . Asthma Mother   . Heart disease Mother   . Hypertension Mother   . Stroke Mother   . Cancer Father        prostate  . Alcohol abuse Neg Hx   . Arthritis Neg Hx   . Birth defects Neg Hx   . COPD Neg Hx   . Depression Neg Hx   . Diabetes Neg Hx   . Drug abuse Neg Hx   . Early death Neg Hx   . Hearing loss Neg Hx   . Hyperlipidemia Neg Hx   . Kidney disease Neg Hx   . Learning disabilities Neg Hx   . Mental illness Neg Hx   . Mental retardation Neg Hx   . Miscarriages / Stillbirths Neg Hx   .  Vision loss Neg Hx   . Varicose Veins Neg Hx     Social History Social History   Tobacco Use  . Smoking status: Never Smoker  . Smokeless tobacco: Never Used  Substance Use Topics  . Alcohol use: No  . Drug use: No     Allergies   Penicillins   Review of Systems Review of Systems  Constitutional: Negative for fever.  Gastrointestinal: Negative for nausea and vomiting.  Neurological: Positive for headaches.  All other systems reviewed and are negative.    Physical Exam Updated Vital Signs BP (!) 132/95 (BP Location: Left Arm)   Pulse 89   Temp 98.6 F (37 C) (Oral)   Resp 16   Ht 5\' 8"  (1.727 m)   Wt 112 kg   LMP 08/09/2017   SpO2 100%   BMI 37.56 kg/m   Physical Exam  Constitutional: She is oriented to person, place, and time. She appears well-developed and well-nourished. No distress.  HENT:  Head: Normocephalic and atraumatic.  Eyes: Pupils are equal, round, and reactive to light. EOM are normal. Right eye exhibits normal extraocular motion and no nystagmus. Left eye exhibits normal extraocular motion and no nystagmus.  There is no papilledema noted on funduscopic exam  Neck: Normal range of motion. Neck supple.  Cardiovascular: Normal rate and regular rhythm. Exam reveals no gallop and no friction rub.  No murmur heard. Pulmonary/Chest: Effort normal and breath sounds normal. No respiratory distress. She has no wheezes.  Abdominal: Soft. Bowel sounds are normal. She exhibits no distension. There is no tenderness.  Musculoskeletal: Normal range of motion.  Neurological: She is alert and oriented to person, place, and time. She has normal strength. Coordination and gait normal.  Skin: Skin is warm and dry. She is not diaphoretic.  Nursing note and vitals reviewed.    ED Treatments / Results  Labs (all labs ordered are listed, but only abnormal results are displayed) Labs Reviewed  COMPREHENSIVE METABOLIC PANEL  CBC WITH DIFFERENTIAL/PLATELET    URINALYSIS, ROUTINE W REFLEX MICROSCOPIC    EKG None  Radiology No results found.  Procedures Procedures (including critical care time)  Medications Ordered in ED Medications - No data to display   Initial Impression / Assessment and Plan / ED Course  I have reviewed the triage vital signs and the nursing notes.  Pertinent labs & imaging results that were available during my care of the patient were reviewed by me and considered in  my medical decision making (see chart for details).  Patient is pregnant approximately 3 months along presenting with complaints of headache.  Her neurologic exam is nonfocal and she appears in no distress.  Her laboratory studies, physical exam, and vital signs are not consistent with preeclampsia.  She has also undergone a CT angiogram of the head in the past several months.  This study was unremarkable for aneurysm or other abnormality.  I highly doubt any intracranial hemorrhage or mass given the normal CT angiogram in the recent past.  She will be discharged, to follow-up as needed.  Final Clinical Impressions(s) / ED Diagnoses   Final diagnoses:  None    ED Discharge Orders    None       Geoffery Lyons, MD 11/14/17 [phone]

## 2017-11-14 NOTE — Discharge Instructions (Addendum)
Tylenol 1000 mg every 6 hours as needed for pain.  Return to the emergency department if you develop worsening headache, high fever, stiff neck, or other new and concerning symptoms.

## 2017-11-19 ENCOUNTER — Emergency Department (HOSPITAL_COMMUNITY)
Admission: EM | Admit: 2017-11-19 | Discharge: 2017-11-20 | Disposition: A | Discharge status: Home / Self Care | Payer: 59 | Attending: Emergency Medicine | Admitting: Emergency Medicine

## 2017-11-19 ENCOUNTER — Other Ambulatory Visit: Payer: Self-pay

## 2017-11-19 ENCOUNTER — Encounter (HOSPITAL_COMMUNITY): Payer: Self-pay | Admitting: Emergency Medicine

## 2017-11-19 DIAGNOSIS — I10 Essential (primary) hypertension: Secondary | ICD-10-CM | POA: Diagnosis not present

## 2017-11-19 DIAGNOSIS — Z79899 Other long term (current) drug therapy: Secondary | ICD-10-CM | POA: Diagnosis not present

## 2017-11-19 DIAGNOSIS — R51 Headache: Secondary | ICD-10-CM

## 2017-11-19 DIAGNOSIS — Z3A15 15 weeks gestation of pregnancy: Secondary | ICD-10-CM | POA: Diagnosis not present

## 2017-11-19 DIAGNOSIS — R519 Headache, unspecified: Secondary | ICD-10-CM

## 2017-11-19 DIAGNOSIS — R7303 Prediabetes: Secondary | ICD-10-CM | POA: Diagnosis not present

## 2017-11-19 DIAGNOSIS — O2692 Pregnancy related conditions, unspecified, second trimester: Secondary | ICD-10-CM | POA: Insufficient documentation

## 2017-11-19 LAB — URINALYSIS, ROUTINE W REFLEX MICROSCOPIC
BILIRUBIN URINE: NEGATIVE
Glucose, UA: NEGATIVE mg/dL
HGB URINE DIPSTICK: NEGATIVE
Ketones, ur: 5 mg/dL — AB
Leukocytes, UA: NEGATIVE
NITRITE: NEGATIVE
Protein, ur: NEGATIVE mg/dL
SPECIFIC GRAVITY, URINE: 1.005 (ref 1.005–1.030)
pH: 6 (ref 5.0–8.0)

## 2017-11-19 MED ORDER — METOCLOPRAMIDE HCL 5 MG/ML IJ SOLN
10.0000 mg | Freq: Once | INTRAMUSCULAR | Status: AC
Start: 2017-11-19 — End: 2017-11-19
  Administered 2017-11-19: 10 mg via INTRAVENOUS
  Filled 2017-11-19: qty 2

## 2017-11-19 MED ORDER — BUTALBITAL-APAP-CAFFEINE 50-325-40 MG PO TABS
1.0000 | ORAL_TABLET | Freq: Once | ORAL | Status: AC
  Administered 2017-11-19: 1 via ORAL
  Filled 2017-11-19: qty 1

## 2017-11-19 MED ORDER — SODIUM CHLORIDE 0.9 % IV BOLUS
1000.0000 mL | Freq: Once | INTRAVENOUS | Status: AC
  Administered 2017-11-19: 1000 mL via INTRAVENOUS

## 2017-11-19 MED ORDER — DIPHENHYDRAMINE HCL 50 MG/ML IJ SOLN
25.0000 mg | Freq: Once | INTRAMUSCULAR | Status: AC
  Administered 2017-11-19: 25 mg via INTRAVENOUS
  Filled 2017-11-19: qty 1

## 2017-11-19 NOTE — ED Provider Notes (Signed)
[facility] EMERGENCY DEPARTMENT Provider Note   CSN: [id number] Arrival date & time: 11/19/17  1733     History   Chief Complaint Chief Complaint  Patient presents with  . Headache    HPI Mindy Wade is a 37 y.o. female.  HPI   38 year old female, G9, P6, [redacted] weeks gestation who presents with chief complaint of headache and neck pain.  Patient states that for the last week she has had waxing and waning headache described as occipital to frontal, behind her right eye, throbbing, gradually getting worse and gradually remitting.  Nothing makes her headache better or worse.  Patient is attempted 1 g Tylenol every 8 hours as needed for the pain with mild relief.  Patient was seen 5 days prior to arrival for same complaint at outside facility.  Patient treated with Reglan and Benadryl with mild improvement of headache.  Patient has not taken prescription for Reglan due to mild effect of her last ED visit.  Patient states that neck pain is constant, nonradiating, unchanging with movement, denies fevers, denies radicular pain, denies extremity weakness, denies trauma or falls.  Past Medical History:  Diagnosis Date  . Breast feeding status of mother   . Hx of blood clots   . Hx of varicella   . Hyperlipemia   . Hypertension   . Pregnancy induced hypertension   . Pulmonary embolism [facility])     Patient Active Problem List   Diagnosis Date Noted  . Missed abortion with fetal demise before 20 completed weeks of gestation 05/03/2017  . Precipitous delivery 02/19/2015  . Postpartum care following vaginal delivery 02/19/2015  . Active labor at term 02/18/2015  . Pre-diabetes 05/14/2014  . Elevated LDL cholesterol level 05/14/2014  . Pulmonary embolism (HCC) 10/17/2013  . Labor and delivery indication for care or intervention 10/09/2013  . Spontaneous abortion in second trimester 10/09/2013  . SVD (spontaneous vaginal delivery) 03/02/2013    Past Surgical History:   Procedure Laterality Date  . CHOLECYSTECTOMY       OB History    Gravida  9   Para  7   Term  6   Preterm  1   AB  1   Living  6     SAB  1   TAB      Ectopic      Multiple  0   Live Births  7            Home Medications    Prior to Admission medications   Medication Sig Start Date End Date Taking? Authorizing Provider  enoxaparin (LOVENOX) 40 MG/0.4ML injection Inject 40 mg into the skin daily.    [provider]  famotidine (PEPCID) 20 MG tablet Take 1 tablet (20 mg total) by mouth 2 (two) times daily. Patient not taking: Reported on 10/10/2017 07/03/17   Terrilee Files, MD  fluticasone [facility]) 50 MCG/ACT nasal spray Place 2 sprays into both nostrils daily. Patient not taking: Reported on 10/10/2017 09/04/17   Belinda Fisher, PA-C  labetalol (NORMODYNE) 200 MG tablet Take 200 mg by mouth 2 (two) times daily. Reported on 05/02/2015    [provider]  meloxicam (MOBIC) 7.5 MG tablet Take 1 tablet (7.5 mg total) by mouth daily. Patient not taking: Reported on 10/10/2017 09/04/17   Belinda Fisher, PA-C  Prenatal Vit-Fe Fumarate-FA (PRENATAL MULTIVITAMIN) TABS tablet Take 1 tablet by mouth daily at 12 noon.    [provider]  Family History Family History  Problem Relation Age of Onset  . Asthma Mother   . Heart disease Mother   . Hypertension Mother   . Stroke Mother   . Cancer Father        prostate  . Alcohol abuse Neg Hx   . Arthritis Neg Hx   . Birth defects Neg Hx   . COPD Neg Hx   . Depression Neg Hx   . Diabetes Neg Hx   . Drug abuse Neg Hx   . Early death Neg Hx   . Hearing loss Neg Hx   . Hyperlipidemia Neg Hx   . Kidney disease Neg Hx   . Learning disabilities Neg Hx   . Mental illness Neg Hx   . Mental retardation Neg Hx   . Miscarriages / Stillbirths Neg Hx   . Vision loss Neg Hx   . Varicose Veins Neg Hx     Social History Social History   Tobacco Use  . Smoking status: Never Smoker  . Smokeless tobacco:  Never Used  Substance Use Topics  . Alcohol use: No  . Drug use: No     Allergies   Penicillins   Review of Systems Review of Systems  Constitutional: Negative for chills and fever.  HENT: Negative for ear pain and sore throat.   Eyes: Negative for pain and visual disturbance.  Respiratory: Negative for cough and shortness of breath.   Cardiovascular: Negative for chest pain and palpitations.  Gastrointestinal: Negative for abdominal pain and vomiting.  Genitourinary: Negative for dysuria and hematuria.  Musculoskeletal: Positive for neck pain. Negative for arthralgias, back pain and neck stiffness.  Skin: Negative for color change and rash.  Neurological: Positive for headaches. Negative for seizures and syncope.  All other systems reviewed and are negative.    Physical Exam Updated Vital Signs BP 128/85 (BP Location: Right Arm)   Pulse 77   Temp 98.9 F (37.2 C) (Oral)   Resp 16   LMP 08/09/2017   SpO2 100%   Physical Exam  Constitutional: She appears well-developed and well-nourished. No distress.  HENT:  Head: Normocephalic and atraumatic.  Eyes: Pupils are equal, round, and reactive to light. Conjunctivae and EOM are normal. Right eye exhibits normal extraocular motion. Left eye exhibits normal extraocular motion.  Neck: Neck supple. No neck rigidity. No tracheal deviation present. No Brudzinski's sign and no Kernig's sign noted.  Cardiovascular: Normal rate and regular rhythm.  No murmur heard. Pulmonary/Chest: Effort normal and breath sounds normal. No respiratory distress.  Abdominal: Soft. There is no tenderness. There is no rigidity, no guarding and negative Murphy's sign.  Musculoskeletal: She exhibits no edema.       Back:  Lymphadenopathy:    She has no cervical adenopathy.  Neurological: She is alert. GCS eye subscore is 4. GCS verbal subscore is 5. GCS motor subscore is 6.  Skin: Skin is warm and dry. Capillary refill takes less than 2 seconds.    Psychiatric: She has a normal mood and affect.  Nursing note and vitals reviewed.    ED Treatments / Results  Labs (all labs ordered are listed, but only abnormal results are displayed) Labs Reviewed  URINALYSIS, ROUTINE W REFLEX MICROSCOPIC    EKG None  Radiology No results found.  Procedures Procedures (including critical care time)  Medications Ordered in ED Medications  sodium chloride 0.9 % bolus 1,000 mL (1,000 mLs Intravenous New Bag/Given 11/19/17 2303)  butalbital-acetaminophen-caffeine (FIORICET, ESGIC) 50-325-40 MG per tablet  1 tablet (1 tablet Oral Given 11/19/17 2200)  metoCLOPramide (REGLAN) injection 10 mg (10 mg Intravenous Given 11/19/17 2304)  diphenhydrAMINE (BENADRYL) injection 25 mg (25 mg Intravenous Given 11/19/17 2304)     Initial Impression / Assessment and Plan / ED Course  I have reviewed the triage vital signs and the nursing notes.  Pertinent labs & imaging results that were available during my care of the patient were reviewed by me and considered in my medical decision making (see chart for details).     37 year old female, G9, P6, [redacted] weeks gestation who presents with chief complaint of headache and neck pain.  History as above.  DDX includes tension versus migraine headache, dehydration, preeclampsia, meningitis, increased ICP, and SAH  History of headache not consistent with increased ICP.  Doubt preeclampsia due to early gestation of pregnancy and absence of alternate diagnostic criteria including right upper quadrant pain or peripheral edema.  Patient without infectious history or meningitic signs, doubt meningitis.  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  Gradual onset in remission with intermittent symptoms not consistent with SAH.  Patient with non-concerning headache.  Patient given migraine cocktail including Reglan, Benadryl, Fioricet and IV fluids.  Patient states market improvement in headache.  Patient with  neurology appointment in the a.m. for chronic headache.  Patient stable for discharge.  Advised follow-up with PCP OB/GYN and neurology.  Patient agrees to plan.  Final Clinical Impressions(s) / ED Diagnoses   Final diagnoses:  Acute intractable headache, unspecified headache type    ED Discharge Orders    None      Margit BandaMattox, Paulanthony Gleaves, MD 11/19/17 [id number]  Jacalyn LefevreHaviland, Julie, MD 11/19/17 2352

## 2017-11-19 NOTE — ED Notes (Signed)
Patient states headache is getting better now, and wants to wait before migraine cocktail/fluids.  Will reassess in 20 minutes and allow patient to decide on care

## 2017-11-19 NOTE — ED Triage Notes (Signed)
Pt c/o daily headaches x 1 week. Seen for the same. [redacted] weeks pregnant, taking tylenol with some relief. Unsure of home BPs.

## 2017-11-19 NOTE — ED Notes (Signed)
EDP explained plan of care to pt. , NS IV bolus infusing , IV site unremarkable .

## 2017-11-19 NOTE — ED Notes (Signed)
Patient freaking out, feeling very anxious, pulling clothes off.  Dr. Particia NearingHaviland made aware

## 2017-11-19 NOTE — ED Provider Notes (Signed)
Patient placed in Quick Look pathway, seen and evaluated   Chief Complaint: Headache  HPI:  Patient is [redacted]wks pregnant and here for ongoing headaches. She has been seen for this several times in the ED. reports headache comes and goes.  Starts in the back of her head and radiates to bilateral shoulders.  Pain is sharp.  She has tried taking Tylenol, Reglan and Benadryl for her symptoms without much relief.  Stopped taking Reglan and Benadryl because it was not helping her.  Denies fevers, neck stiffness, numbness, weakness, visual disturbance, difficulty with speech or swallowing. No recent trauma  ROS: No fevers or neck stiffness  Physical Exam:   Gen: No distress  Neuro: Awake and Alert  Skin: Warm    Focused Exam: Head normocephalic and atraumatic. Full neck ROM. PERRLA. Strength 5/5 in bilateral upper and lower extremities.    Initiation of care has begun. The patient has been counseled on the process, plan, and necessity for staying for the completion/evaluation, and the remainder of the medical screening examination   Lawrence MarseillesShrosbree, Asante Blanda J, PA-C 11/19/17 1803    Melene PlanFloyd, Dan, DO 11/19/17 2222

## 2017-11-20 NOTE — ED Notes (Signed)
Reviewed d/c instructions with pt, who verbalized understanding and had no outstanding questions. Pt departed in NAD, refused use of wheelchair.   

## 2017-12-01 ENCOUNTER — Emergency Department (HOSPITAL_COMMUNITY)
Admission: EM | Admit: 2017-12-01 | Discharge: 2017-12-01 | Disposition: A | Discharge status: Home / Self Care | Payer: 59 | Attending: Emergency Medicine | Admitting: Emergency Medicine

## 2017-12-01 ENCOUNTER — Emergency Department (HOSPITAL_COMMUNITY): Payer: 59

## 2017-12-01 ENCOUNTER — Other Ambulatory Visit: Payer: Self-pay

## 2017-12-01 ENCOUNTER — Encounter (HOSPITAL_COMMUNITY): Payer: Self-pay | Admitting: Emergency Medicine

## 2017-12-01 DIAGNOSIS — Z79899 Other long term (current) drug therapy: Secondary | ICD-10-CM | POA: Insufficient documentation

## 2017-12-01 DIAGNOSIS — R0789 Other chest pain: Secondary | ICD-10-CM | POA: Insufficient documentation

## 2017-12-01 DIAGNOSIS — I10 Essential (primary) hypertension: Secondary | ICD-10-CM | POA: Insufficient documentation

## 2017-12-01 LAB — CBC
HEMATOCRIT: 32.6 % — AB (ref 36.0–46.0)
HEMOGLOBIN: 10.2 g/dL — AB (ref 12.0–15.0)
MCH: 30.6 pg (ref 26.0–34.0)
MCHC: 31.3 g/dL (ref 30.0–36.0)
MCV: 97.9 fL (ref 78.0–100.0)
Platelets: 264 10*3/uL (ref 150–400)
RBC: 3.33 MIL/uL — ABNORMAL LOW (ref 3.87–5.11)
RDW: 14.6 % (ref 11.5–15.5)
WBC: 5.9 10*3/uL (ref 4.0–10.5)

## 2017-12-01 LAB — BASIC METABOLIC PANEL
ANION GAP: 5 (ref 5–15)
BUN: 5 mg/dL — ABNORMAL LOW (ref 6–20)
CALCIUM: 8.8 mg/dL — AB (ref 8.9–10.3)
CO2: 23 mmol/L (ref 22–32)
Chloride: 109 mmol/L (ref 98–111)
Creatinine, Ser: 0.64 mg/dL (ref 0.44–1.00)
GFR calc Af Amer: >60 mL/min (ref >60)
GFR calc non Af Amer: >60 mL/min (ref >60)
GLUCOSE: 88 mg/dL (ref 70–99)
Potassium: 3.7 mmol/L (ref 3.5–5.1)
Sodium: 137 mmol/L (ref 135–145)

## 2017-12-01 LAB — I-STAT BETA HCG BLOOD, ED (MC, WL, AP ONLY): I-stat hCG, quantitative: >2000 m[IU]/mL — ABNORMAL HIGH (ref <5)

## 2017-12-01 LAB — I-STAT TROPONIN, ED: TROPONIN I, POC: 0 ng/mL (ref 0.00–0.08)

## 2017-12-01 MED ORDER — FAMOTIDINE 20 MG PO TABS: 20.0000 mg | ORAL_TABLET | Freq: Two times a day (BID) | ORAL | 0 refills | Status: DC

## 2017-12-01 NOTE — ED Triage Notes (Addendum)
Pt report central chest pain that began this morning, hx of the same, saw a cardiologist who diagnosed her with heart burn, not on any medications for symptoms. Pt a/ox4 resp e/u, nad. Pt also reports she is [redacted] weeks pregnant. Denies sob

## 2017-12-01 NOTE — ED Provider Notes (Signed)
[facility] EMERGENCY DEPARTMENT Provider Note   CSN: [id number] Arrival date & time: 12/01/17  1328     History   Chief Complaint Chief Complaint  Patient presents with  . Chest Pain    HPI Mindy Wade is a 37 y.o. female.  HPI Patient presents with concern of chest pain. She awoke this morning about 6 hours prior to my evaluation with dull soreness across the mid thorax, not solely about the sternum. No dyspnea, no fever, no nausea, no vomiting. Symptoms actually improved without specific intervention since onset. Patient states that she is generally well, though she is 4 months into her seventh pregnancy. She does have a history of reflux, currently takes no medication for this.  Past Medical History:  Diagnosis Date  . Breast feeding status of mother   . Hx of blood clots   . Hx of varicella   . Hyperlipemia   . Hypertension   . Pregnancy induced hypertension   . Pulmonary embolism [facility])     Patient Active Problem List   Diagnosis Date Noted  . Missed abortion with fetal demise before 20 completed weeks of gestation 05/03/2017  . Precipitous delivery 02/19/2015  . Postpartum care following vaginal delivery 02/19/2015  . Active labor at term 02/18/2015  . Pre-diabetes 05/14/2014  . Elevated LDL cholesterol level 05/14/2014  . Pulmonary embolism (HCC) 10/17/2013  . Labor and delivery indication for care or intervention 10/09/2013  . Spontaneous abortion in second trimester 10/09/2013  . SVD (spontaneous vaginal delivery) 03/02/2013    Past Surgical History:  Procedure Laterality Date  . CHOLECYSTECTOMY       OB History    Gravida  9   Para  7   Term  6   Preterm  1   AB  1   Living  6     SAB  1   TAB      Ectopic      Multiple  0   Live Births  7            Home Medications    Prior to Admission medications   Medication Sig Start Date End Date Taking? Authorizing Provider  enoxaparin (LOVENOX) 40  MG/0.4ML injection Inject 40 mg into the skin daily.    [provider]  famotidine (PEPCID) 20 MG tablet Take 1 tablet (20 mg total) by mouth 2 (two) times daily. 12/01/17   Gerhard Munch, MD  fluticasone [facility]) 50 MCG/ACT nasal spray Place 2 sprays into both nostrils daily. Patient not taking: Reported on 10/10/2017 09/04/17   Belinda Fisher, PA-C  labetalol (NORMODYNE) 200 MG tablet Take 200 mg by mouth 2 (two) times daily. Reported on 05/02/2015    [provider]  meloxicam (MOBIC) 7.5 MG tablet Take 1 tablet (7.5 mg total) by mouth daily. Patient not taking: Reported on 10/10/2017 09/04/17   Belinda Fisher, PA-C  Prenatal Vit-Fe Fumarate-FA (PRENATAL MULTIVITAMIN) TABS tablet Take 1 tablet by mouth daily at 12 noon.    [provider]    Family History Family History  Problem Relation Age of Onset  . Asthma Mother   . Heart disease Mother   . Hypertension Mother   . Stroke Mother   . Cancer Father        prostate  . Alcohol abuse Neg Hx   . Arthritis Neg Hx   . Birth defects Neg Hx   . COPD Neg Hx   . Depression Neg Hx   .  Diabetes Neg Hx   . Drug abuse Neg Hx   . Early death Neg Hx   . Hearing loss Neg Hx   . Hyperlipidemia Neg Hx   . Kidney disease Neg Hx   . Learning disabilities Neg Hx   . Mental illness Neg Hx   . Mental retardation Neg Hx   . Miscarriages / Stillbirths Neg Hx   . Vision loss Neg Hx   . Varicose Veins Neg Hx     Social History Social History   Tobacco Use  . Smoking status: Never Smoker  . Smokeless tobacco: Never Used  Substance Use Topics  . Alcohol use: No  . Drug use: No     Allergies   Penicillins   Review of Systems Review of Systems  Constitutional:       Per HPI, otherwise negative  HENT:       Per HPI, otherwise negative  Respiratory:       Per HPI, otherwise negative  Cardiovascular:       Per HPI, otherwise negative  Gastrointestinal: Negative for vomiting.  Endocrine:       Negative aside from  HPI  Genitourinary:       Neg aside from HPI   Musculoskeletal:       Per HPI, otherwise negative  Skin: Negative.   Neurological: Negative for syncope.     Physical Exam Updated Vital Signs BP 116/81 (BP Location: Right Arm)   Pulse 67   Temp 98.6 F (37 C) (Oral)   Resp 18   LMP 08/09/2017   SpO2 100%   Physical Exam  Constitutional: She is oriented to person, place, and time. She appears well-developed and well-nourished. No distress.  HENT:  Head: Normocephalic and atraumatic.  Eyes: Conjunctivae and EOM are normal.  Cardiovascular: Normal rate and regular rhythm.  Pulmonary/Chest: Effort normal and breath sounds normal. No stridor. No respiratory distress.  Abdominal: She exhibits no distension.  Musculoskeletal: She exhibits no edema.  Neurological: She is alert and oriented to person, place, and time. No cranial nerve deficit.  Skin: Skin is warm and dry.  Psychiatric: She has a normal mood and affect.  Nursing note and vitals reviewed.    ED Treatments / Results  Labs (all labs ordered are listed, but only abnormal results are displayed) Labs Reviewed  BASIC METABOLIC PANEL - Abnormal; Notable for the following components:      Result Value   BUN 5 (*)    Calcium 8.8 (*)    All other components within normal limits  CBC - Abnormal; Notable for the following components:   RBC 3.33 (*)    Hemoglobin 10.2 (*)    HCT 32.6 (*)    All other components within normal limits  I-STAT BETA HCG BLOOD, ED ([facility], [facility], [facility] ONLY) - Abnormal; Notable for the following components:   I-stat hCG, quantitative >2,000.0 (*)    All other components within normal limits  I-STAT TROPONIN, ED    EKG EKG Interpretation  Date/Time:    Ventricular Rate:  72 PR Interval:  158 QRS Duration: 80 QT Interval:  364 QTC Calculation: 398 R Axis:   67 Text Interpretation:  Sinus rhythm Artifact Otherwise within normal limits Confirmed by Gerhard Munch [phone]) on 12/01/2017 4:49:18  PM   Radiology Dg Chest 2 View  Result Date: 12/01/2017 CLINICAL DATA:  Chest pain EXAM: CHEST - 2 VIEW COMPARISON:  10/09/2017 FINDINGS: The heart size and mediastinal contours are within normal limits. Both  lungs are clear. The visualized skeletal structures are unremarkable. IMPRESSION: No active cardiopulmonary disease. Electronically Signed   By: Kennith Center M.D.   On: 12/01/2017 14:22    Procedures Procedures (including critical care time)  Medications Ordered in ED Medications - No data to display   Initial Impression / Assessment and Plan / ED Course  I have reviewed the triage vital signs and the nursing notes.  Pertinent labs & imaging results that were available during my care of the patient were reviewed by me and considered in my medical decision making (see chart for details).  On repeat exam the patient is awake, alert, in no distress. We discussed all findings including reassuring labs, no evidence for ACS, pneumonia, PE. Patient's symptoms may be secondary to reflux given her progression in pregnancy. Patient started on category B Pepcid, will follow up with primary care.  Final Clinical Impressions(s) / ED Diagnoses   Final diagnoses:  Atypical chest pain    ED Discharge Orders         Ordered    famotidine (PEPCID) 20 MG tablet  2 times daily     12/01/17 1620           Gerhard Munch, MD 12/01/17 1649

## 2017-12-01 NOTE — Discharge Instructions (Signed)
As discussed, your evaluation today has been largely reassuring.  But, it is important that you monitor your condition carefully, and do not hesitate to return to the ED if you develop new, or concerning changes in your condition. ? ?Otherwise, please follow-up with your physician for appropriate ongoing care. ? ?

## 2017-12-07 ENCOUNTER — Encounter (HOSPITAL_COMMUNITY): Payer: Self-pay | Admitting: Emergency Medicine

## 2017-12-07 ENCOUNTER — Other Ambulatory Visit: Payer: Self-pay

## 2017-12-07 ENCOUNTER — Emergency Department (HOSPITAL_COMMUNITY)
Admission: EM | Admit: 2017-12-07 | Discharge: 2017-12-07 | Disposition: A | Discharge status: Home / Self Care | Payer: 59 | Attending: Emergency Medicine | Admitting: Emergency Medicine

## 2017-12-07 ENCOUNTER — Emergency Department (HOSPITAL_COMMUNITY): Payer: 59

## 2017-12-07 DIAGNOSIS — Z79899 Other long term (current) drug therapy: Secondary | ICD-10-CM | POA: Insufficient documentation

## 2017-12-07 DIAGNOSIS — O9989 Other specified diseases and conditions complicating pregnancy, childbirth and the puerperium: Secondary | ICD-10-CM | POA: Insufficient documentation

## 2017-12-07 DIAGNOSIS — Z3A18 18 weeks gestation of pregnancy: Secondary | ICD-10-CM | POA: Diagnosis not present

## 2017-12-07 DIAGNOSIS — G43109 Migraine with aura, not intractable, without status migrainosus: Secondary | ICD-10-CM | POA: Insufficient documentation

## 2017-12-07 DIAGNOSIS — Z7901 Long term (current) use of anticoagulants: Secondary | ICD-10-CM | POA: Diagnosis not present

## 2017-12-07 DIAGNOSIS — O132 Gestational [pregnancy-induced] hypertension without significant proteinuria, second trimester: Secondary | ICD-10-CM | POA: Insufficient documentation

## 2017-12-07 LAB — COMPREHENSIVE METABOLIC PANEL
ALK PHOS: 43 U/L (ref 38–126)
ALT: 16 U/L (ref 0–44)
ANION GAP: 9 (ref 5–15)
AST: 23 U/L (ref 15–41)
Albumin: 3.3 g/dL — ABNORMAL LOW (ref 3.5–5.0)
BUN: 10 mg/dL (ref 6–20)
CALCIUM: 9 mg/dL (ref 8.9–10.3)
CO2: 20 mmol/L — ABNORMAL LOW (ref 22–32)
Chloride: 107 mmol/L (ref 98–111)
Creatinine, Ser: 0.69 mg/dL (ref 0.44–1.00)
Glucose, Bld: 82 mg/dL (ref 70–99)
POTASSIUM: 3.7 mmol/L (ref 3.5–5.1)
Sodium: 136 mmol/L (ref 135–145)
Total Bilirubin: 0.3 mg/dL (ref 0.3–1.2)
Total Protein: 7.3 g/dL (ref 6.5–8.1)

## 2017-12-07 LAB — DIFFERENTIAL
Abs Immature Granulocytes: 0.02 10*3/uL (ref 0.00–0.07)
Basophils Absolute: 0 10*3/uL (ref 0.0–0.1)
Basophils Relative: 0 %
EOS ABS: 0.1 10*3/uL (ref 0.0–0.5)
EOS PCT: 1 %
IMMATURE GRANULOCYTES: 0 %
LYMPHS ABS: 2.8 10*3/uL (ref 0.7–4.0)
Lymphocytes Relative: 37 %
MONO ABS: 0.8 10*3/uL (ref 0.1–1.0)
MONOS PCT: 11 %
Neutro Abs: 4 10*3/uL (ref 1.7–7.7)
Neutrophils Relative %: 51 %

## 2017-12-07 LAB — CBC
HCT: 34.7 % — ABNORMAL LOW (ref 36.0–46.0)
Hemoglobin: 11.2 g/dL — ABNORMAL LOW (ref 12.0–15.0)
MCH: 30.5 pg (ref 26.0–34.0)
MCHC: 32.3 g/dL (ref 30.0–36.0)
MCV: 94.6 fL (ref 80.0–100.0)
PLATELETS: 299 10*3/uL (ref 150–400)
RBC: 3.67 MIL/uL — ABNORMAL LOW (ref 3.87–5.11)
RDW: 14.1 % (ref 11.5–15.5)
WBC: 7.7 10*3/uL (ref 4.0–10.5)
nRBC: 0 % (ref 0.0–0.2)

## 2017-12-07 LAB — PROTIME-INR
INR: 1.07
PROTHROMBIN TIME: 13.8 s (ref 11.4–15.2)

## 2017-12-07 LAB — APTT: aPTT: 30 seconds (ref 24–36)

## 2017-12-07 NOTE — ED Triage Notes (Addendum)
Pt states she is [redacted] weeks pregnant.  Reports weakness and then numbness to L arm that started at 11:45pm while driving home from work.  No arm drift or leg drift.  Speech clear.  No visual problems.  Reports headache that started about the same time but has resolved.  Frequent headaches over the past 2 months.  MRI last week at [facility] due to headaches.  Reports decreased sensation to LUE.  Taking BP medication.

## 2017-12-07 NOTE — ED Provider Notes (Signed)
[facility] EMERGENCY DEPARTMENT Provider Note   CSN: [id number] Arrival date & time: 12/07/17  0020     History   Chief Complaint Chief Complaint  Patient presents with  . Extremity Weakness    HPI Mindy Wade is a 37 y.o. female.  Patient presents to the emergency department for evaluation of headache and left arm numbness and weakness.  Patient reports that she was driving when she noticed sudden onset of symptoms around 11:45 PM.  Patient reports that she has frequent headaches.  She had an MRI performed recently for these headaches, was told that she had some nonspecific findings that are being monitored.  Patient is [redacted] weeks pregnant.  No abdominal pain, pelvic pain, bleeding.  Patient reports that she was able to use her left arm but it felt slightly weak and there was numbness and tingling.  This is improving, however, at time of my evaluation.  Her headache has completely resolved.     Past Medical History:  Diagnosis Date  . Breast feeding status of mother   . Hx of blood clots   . Hx of varicella   . Hyperlipemia   . Hypertension   . Pregnancy induced hypertension   . Pulmonary embolism [facility])     Patient Active Problem List   Diagnosis Date Noted  . Missed abortion with fetal demise before 20 completed weeks of gestation 05/03/2017  . Precipitous delivery 02/19/2015  . Postpartum care following vaginal delivery 02/19/2015  . Active labor at term 02/18/2015  . Pre-diabetes 05/14/2014  . Elevated LDL cholesterol level 05/14/2014  . Pulmonary embolism (HCC) 10/17/2013  . Labor and delivery indication for care or intervention 10/09/2013  . Spontaneous abortion in second trimester 10/09/2013  . SVD (spontaneous vaginal delivery) 03/02/2013    Past Surgical History:  Procedure Laterality Date  . CHOLECYSTECTOMY       OB History    Gravida  9   Para  7   Term  6   Preterm  1   AB  1   Living  6     SAB  1   TAB      Ectopic      Multiple  0   Live Births  7            Home Medications    Prior to Admission medications   Medication Sig Start Date End Date Taking? Authorizing Provider  enoxaparin (LOVENOX) 40 MG/0.4ML injection Inject 40 mg into the skin daily.    [provider]  famotidine (PEPCID) 20 MG tablet Take 1 tablet (20 mg total) by mouth 2 (two) times daily. 12/01/17   Gerhard Munch, MD  fluticasone [facility]) 50 MCG/ACT nasal spray Place 2 sprays into both nostrils daily. Patient not taking: Reported on 10/10/2017 09/04/17   Belinda Fisher, PA-C  labetalol (NORMODYNE) 200 MG tablet Take 200 mg by mouth 2 (two) times daily. Reported on 05/02/2015    [provider]  meloxicam (MOBIC) 7.5 MG tablet Take 1 tablet (7.5 mg total) by mouth daily. Patient not taking: Reported on 10/10/2017 09/04/17   Belinda Fisher, PA-C  Prenatal Vit-Fe Fumarate-FA (PRENATAL MULTIVITAMIN) TABS tablet Take 1 tablet by mouth daily at 12 noon.    [provider]    Family History Family History  Problem Relation Age of Onset  . Asthma Mother   . Heart disease Mother   . Hypertension Mother   . Stroke Mother   . Cancer  Father        prostate  . Alcohol abuse Neg Hx   . Arthritis Neg Hx   . Birth defects Neg Hx   . COPD Neg Hx   . Depression Neg Hx   . Diabetes Neg Hx   . Drug abuse Neg Hx   . Early death Neg Hx   . Hearing loss Neg Hx   . Hyperlipidemia Neg Hx   . Kidney disease Neg Hx   . Learning disabilities Neg Hx   . Mental illness Neg Hx   . Mental retardation Neg Hx   . Miscarriages / Stillbirths Neg Hx   . Vision loss Neg Hx   . Varicose Veins Neg Hx     Social History Social History   Tobacco Use  . Smoking status: Never Smoker  . Smokeless tobacco: Never Used  Substance Use Topics  . Alcohol use: No  . Drug use: No     Allergies   Penicillins   Review of Systems Review of Systems  Neurological: Positive for weakness, numbness and headaches.  All  other systems reviewed and are negative.    Physical Exam Updated Vital Signs BP 118/75   Pulse 86   Temp 99.1 F (37.3 C) (Oral)   Resp 20   Ht 5\' 8"  (1.727 m)   Wt 113.4 kg   LMP 08/09/2017   SpO2 99%   BMI 38.01 kg/m   Physical Exam  Constitutional: She is oriented to person, place, and time. She appears well-developed and well-nourished. No distress.  HENT:  Head: Normocephalic and atraumatic.  Right Ear: Hearing normal.  Left Ear: Hearing normal.  Nose: Nose normal.  Mouth/Throat: Oropharynx is clear and moist and mucous membranes are normal.  Eyes: Pupils are equal, round, and reactive to light. Conjunctivae and EOM are normal.  Neck: Normal range of motion. Neck supple.  Cardiovascular: Regular rhythm, S1 normal and S2 normal. Exam reveals no gallop and no friction rub.  No murmur heard. Pulmonary/Chest: Effort normal and breath sounds normal. No respiratory distress. She exhibits no tenderness.  Abdominal: Soft. Normal appearance and bowel sounds are normal. There is no hepatosplenomegaly. There is no tenderness. There is no rebound, no guarding, no tenderness at McBurney's point and negative Murphy's sign. No hernia.  Musculoskeletal: Normal range of motion.  Neurological: She is alert and oriented to person, place, and time. She has normal strength. No cranial nerve deficit or sensory deficit. Coordination normal. GCS eye subscore is 4. GCS verbal subscore is 5. GCS motor subscore is 6.  Extraocular muscle movement: normal No visual field cut Pupils: equal and reactive both direct and consensual response is normal No nystagmus present    Sensory function is intact to light touch, pinprick Proprioception intact  Grip strength 5/5 symmetric in upper extremities No pronator drift Normal finger to nose bilaterally      Skin: Skin is warm, dry and intact. No rash noted. No cyanosis.  Psychiatric: She has a normal mood and affect. Her speech is normal and  behavior is normal. Thought content normal.  Nursing note and vitals reviewed.    ED Treatments / Results  Labs (all labs ordered are listed, but only abnormal results are displayed) Labs Reviewed  CBC - Abnormal; Notable for the following components:      Result Value   RBC 3.67 (*)    Hemoglobin 11.2 (*)    HCT 34.7 (*)    All other components within normal limits  COMPREHENSIVE  METABOLIC PANEL - Abnormal; Notable for the following components:   CO2 20 (*)    Albumin 3.3 (*)    All other components within normal limits  PROTIME-INR  APTT  DIFFERENTIAL  I-STAT TROPONIN, ED    EKG None  Radiology Mr Brain Wo Contrast  Result Date: 12/07/2017 CLINICAL DATA:  Focal neurologic deficit. EXAM: MRI HEAD WITHOUT CONTRAST TECHNIQUE: Multiplanar, multiecho pulse sequences of the brain and surrounding structures were obtained without intravenous contrast. COMPARISON:  Head CT 09/25/2017 FINDINGS: BRAIN: There is no acute infarct, acute hemorrhage, hydrocephalus or extra-axial collection. The midline structures are normal. No midline shift or other mass effect. There are no old infarcts. Scattered foci of white matter hyperintensity within both hemispheres. Pattern is nonspecific. The cerebral and cerebellar volume are age-appropriate. Susceptibility-sensitive sequences show no chronic microhemorrhage or superficial siderosis. VASCULAR: Major intracranial arterial and venous sinus flow voids are normal. SKULL AND UPPER CERVICAL SPINE: Calvarial bone marrow signal is normal. There is no skull base mass. Visualized upper cervical spine and soft tissues are normal. SINUSES/ORBITS: No fluid levels or advanced mucosal thickening. No mastoid or middle ear effusion. The orbits are normal. IMPRESSION: 1. No acute abnormality. 2. Scattered small foci of white matter hyperintensity in a nonspecific pattern but most likely an early finding of chronic small vessel ischemic disease. Electronically Signed    By: Deatra Robinson M.D.   On: 12/07/2017 04:20    Procedures Procedures (including critical care time)  Medications Ordered in ED Medications - No data to display   Initial Impression / Assessment and Plan / ED Course  I have reviewed the triage vital signs and the nursing notes.  Pertinent labs & imaging results that were available during my care of the patient were reviewed by me and considered in my medical decision making (see chart for details).     Patient presented to the emergency department for evaluation of acute onset of headache with left arm numbness and weakness.  Symptoms present for approximately 40 minutes at arrival to the ER.  Patient is [redacted] weeks pregnant and is on Lovenox currently because of previous history of PE with pregnancy.  She is therefore not a candidate for TPA.  Code stroke was not initiated because her symptoms are also rapidly improving.  Care plan was discussed with Dr. Laurence Slate, on-call for neurology.  MRI findings from the vomit reviewed.  Patient has partially empty sella turcica and scattered small foci of T2/flair hyperintense signal in the bilateral cerebral white matter without mass-effect.  As patient is experiencing increasing frequency of her headaches, she did have a headache coincident with the onset of the symptoms, it is felt that this is most likely a complicated migraine.  Dr. Laurence Slate did agree with plan to perform MRI, if no change in her symptoms are resolved, can follow-up with neurology as an outpatient.  MRI was performed and findings are similar to previous MRI.  No acute stroke noted.  Patient will therefore be discharged, follow-up with neurology.  Final Clinical Impressions(s) / ED Diagnoses   Final diagnoses:  Complicated migraine    ED Discharge Orders    None       Jonie Burdell, Canary Brim, MD 12/07/17 0630

## 2017-12-07 NOTE — ED Notes (Addendum)
Dr. Blinda Leatherwood notified of pt.  No Code Stroke and no CT at this time. Continue with the rest of the stroke protocol.

## 2017-12-24 ENCOUNTER — Ambulatory Visit (HOSPITAL_COMMUNITY)
Admission: EM | Admit: 2017-12-24 | Discharge: 2017-12-24 | Disposition: A | Discharge status: Home / Self Care | Payer: 59 | Attending: Family Medicine | Admitting: Family Medicine

## 2017-12-24 ENCOUNTER — Encounter (HOSPITAL_COMMUNITY): Payer: Self-pay | Admitting: Emergency Medicine

## 2017-12-24 DIAGNOSIS — B9789 Other viral agents as the cause of diseases classified elsewhere: Secondary | ICD-10-CM | POA: Diagnosis not present

## 2017-12-24 DIAGNOSIS — J069 Acute upper respiratory infection, unspecified: Secondary | ICD-10-CM

## 2017-12-24 MED ORDER — FLUTICASONE PROPIONATE 50 MCG/ACT NA SUSP: 1.0000 | Freq: Every day | NASAL | 0 refills | Status: DC

## 2017-12-24 MED ORDER — GUAIFENESIN-DM 100-10 MG/5ML PO SYRP: 5.0000 mL | ORAL_SOLUTION | ORAL | 0 refills | Status: DC | PRN

## 2017-12-24 MED ORDER — CETIRIZINE HCL 10 MG PO CAPS: 10.0000 mg | ORAL_CAPSULE | Freq: Every day | ORAL | 0 refills | Status: DC

## 2017-12-24 NOTE — Discharge Instructions (Signed)
For your congestion please begin taking daily allergy pill like Zyrtec and Flonase. Robitussin-DM for cough. Please use the list provided a list for further over-the-counter medicines save for pregnancy Please use Tylenol for any body aches and fevers  Please continue to monitor your symptoms and follow-up if symptoms worsening, not improving, developing difficulty breathing, shortness of breath, persistent fevers.  I would expect her symptoms to gradually improve over the next week.  Please follow-up if not having any improvement towards the end of the weekend.

## 2017-12-24 NOTE — ED Triage Notes (Addendum)
  PT reports "flu symptoms", hoarse voice, sore throat, cough, congestion for a few days. Pt had a flu shot a week ago.   PT is [redacted] weeks pregnant

## 2017-12-25 ENCOUNTER — Emergency Department (HOSPITAL_COMMUNITY)
Admission: EM | Admit: 2017-12-25 | Discharge: 2017-12-25 | Disposition: A | Discharge status: Home / Self Care | Payer: 59 | Attending: Emergency Medicine | Admitting: Emergency Medicine

## 2017-12-25 ENCOUNTER — Other Ambulatory Visit: Payer: Self-pay

## 2017-12-25 ENCOUNTER — Encounter (HOSPITAL_COMMUNITY): Payer: Self-pay

## 2017-12-25 DIAGNOSIS — M545 Low back pain, unspecified: Secondary | ICD-10-CM

## 2017-12-25 DIAGNOSIS — O9989 Other specified diseases and conditions complicating pregnancy, childbirth and the puerperium: Secondary | ICD-10-CM | POA: Insufficient documentation

## 2017-12-25 DIAGNOSIS — Z3A20 20 weeks gestation of pregnancy: Secondary | ICD-10-CM | POA: Insufficient documentation

## 2017-12-25 DIAGNOSIS — R8290 Unspecified abnormal findings in urine: Secondary | ICD-10-CM | POA: Diagnosis not present

## 2017-12-25 LAB — URINALYSIS, ROUTINE W REFLEX MICROSCOPIC
Bilirubin Urine: NEGATIVE
GLUCOSE, UA: NEGATIVE mg/dL
Hgb urine dipstick: NEGATIVE
Ketones, ur: 5 mg/dL — AB
LEUKOCYTES UA: NEGATIVE
NITRITE: NEGATIVE
PH: 6 (ref 5.0–8.0)
Protein, ur: NEGATIVE mg/dL
SPECIFIC GRAVITY, URINE: 1.019 (ref 1.005–1.030)

## 2017-12-25 NOTE — ED Triage Notes (Signed)
Pt states right sided flank pain since 0900 today. Pt states that it is worse with movement. Pt is approximately [redacted] weeks pregnant. Pt has seen obgyn for pregnancy.

## 2017-12-25 NOTE — Discharge Instructions (Signed)
If you develop worsening back pain, fever, vomiting, abdominal pain, weakness or numbness in your legs, incontinence of your bowels or bladders, or any other new/concerning symptoms and return to the ER for evaluation.

## 2017-12-25 NOTE — ED Provider Notes (Signed)
[facility]    CSN: [id number] Arrival date & time: 12/24/17  1328     History   Chief Complaint Chief Complaint  Patient presents with  . URI    HPI Mindy Wade is a 37 y.o. female approximately [redacted] weeks pregnant presenting today for evaluation of URI symptoms.  Patient states that last Monday she received her flu shot.  Beginning Friday she started to develop sore throat, cough and congestion.  Cough is been productive.  She has had slight lightheadedness.  Denies shortness of breath or chest pain.  She is tried Mucinex without relief.  Denies fevers.  Denies nausea or vomiting.  Sore throat improves during the day, worsens at nighttime and when waking up.  HPI  Past Medical History:  Diagnosis Date  . Breast feeding status of mother   . Hx of blood clots   . Hx of varicella   . Hyperlipemia   . Hypertension   . Pregnancy induced hypertension   . Pulmonary embolism [facility])     Patient Active Problem List   Diagnosis Date Noted  . Missed abortion with fetal demise before 20 completed weeks of gestation 05/03/2017  . Precipitous delivery 02/19/2015  . Postpartum care following vaginal delivery 02/19/2015  . Active labor at term 02/18/2015  . Pre-diabetes 05/14/2014  . Elevated LDL cholesterol level 05/14/2014  . Pulmonary embolism (HCC) 10/17/2013  . Labor and delivery indication for care or intervention 10/09/2013  . Spontaneous abortion in second trimester 10/09/2013  . SVD (spontaneous vaginal delivery) 03/02/2013    Past Surgical History:  Procedure Laterality Date  . CHOLECYSTECTOMY      OB History    Gravida  9   Para  7   Term  6   Preterm  1   AB  1   Living  6     SAB  1   TAB      Ectopic      Multiple  0   Live Births  7            Home Medications    Prior to Admission medications   Medication Sig Start Date End Date Taking? Authorizing Provider  enoxaparin (LOVENOX) 40 MG/0.4ML injection Inject 40 mg into  the skin daily.   Yes [provider]  folic acid (FOLVITE) 1 MG tablet Take 1 mg by mouth daily.   Yes [provider]  labetalol (NORMODYNE) 200 MG tablet Take 200 mg by mouth 2 (two) times daily. Reported on 05/02/2015   Yes [provider]  Prenatal Vit-Fe Fumarate-FA (PRENATAL MULTIVITAMIN) TABS tablet Take 1 tablet by mouth daily at 12 noon.   Yes [provider]  Cetirizine HCl 10 MG CAPS Take 1 capsule (10 mg total) by mouth daily for 10 days. 12/24/17 01/03/18  Lamichael Youkhana C, PA-C  famotidine (PEPCID) 20 MG tablet Take 1 tablet (20 mg total) by mouth 2 (two) times daily. 12/01/17   Gerhard Munch, MD  fluticasone [facility]) 50 MCG/ACT nasal spray Place 1-2 sprays into both nostrils daily for 7 days. 12/24/17 12/31/17  Mitesh Rosendahl C, PA-C  guaiFENesin-dextromethorphan (ROBITUSSIN DM) 100-10 MG/5ML syrup Take 5 mLs by mouth every 4 (four) hours as needed for cough. 12/24/17   Andros Channing, Junius Creamer, PA-C    Family History Family History  Problem Relation Age of Onset  . Asthma Mother   . Heart disease Mother   . Hypertension Mother   . Stroke Mother   . Cancer  Father        prostate  . Alcohol abuse Neg Hx   . Arthritis Neg Hx   . Birth defects Neg Hx   . COPD Neg Hx   . Depression Neg Hx   . Diabetes Neg Hx   . Drug abuse Neg Hx   . Early death Neg Hx   . Hearing loss Neg Hx   . Hyperlipidemia Neg Hx   . Kidney disease Neg Hx   . Learning disabilities Neg Hx   . Mental illness Neg Hx   . Mental retardation Neg Hx   . Miscarriages / Stillbirths Neg Hx   . Vision loss Neg Hx   . Varicose Veins Neg Hx     Social History Social History   Tobacco Use  . Smoking status: Never Smoker  . Smokeless tobacco: Never Used  Substance Use Topics  . Alcohol use: No  . Drug use: No     Allergies   Penicillins   Review of Systems Review of Systems  Constitutional: Positive for fatigue. Negative for activity change, appetite change,  chills and fever.  HENT: Positive for congestion, rhinorrhea and sore throat. Negative for ear pain, sinus pressure and trouble swallowing.   Eyes: Negative for discharge and redness.  Respiratory: Positive for cough. Negative for chest tightness and shortness of breath.   Cardiovascular: Negative for chest pain.  Gastrointestinal: Negative for abdominal pain, diarrhea, nausea and vomiting.  Musculoskeletal: Positive for myalgias.  Skin: Negative for rash.  Neurological: Positive for light-headedness. Negative for dizziness and headaches.     Physical Exam Triage Vital Signs ED Triage Vitals  Enc Vitals Group     BP 12/24/17 1427 107/69     Pulse Rate 12/24/17 1425 89     Resp 12/24/17 1425 16     Temp 12/24/17 1425 97.9 F (36.6 C)     Temp Source 12/24/17 1425 Oral     SpO2 12/24/17 1425 97 %     Weight --      Height --      Head Circumference --      Peak Flow --      Pain Score 12/24/17 1424 4     Pain Loc --      Pain Edu? --      Excl. in GC? --    No data found.  Updated Vital Signs BP 107/69   Pulse 89   Temp 97.9 F (36.6 C) (Oral)   Resp 16   LMP 08/09/2017   SpO2 97%   Visual Acuity Right Eye Distance:   Left Eye Distance:   Bilateral Distance:    Right Eye Near:   Left Eye Near:    Bilateral Near:     Physical Exam  Constitutional: She appears well-developed and well-nourished. No distress.  HENT:  Head: Normocephalic and atraumatic.  Bilateral ears without tenderness to palpation of external auricle, tragus and mastoid, EAC's without erythema or swelling, TM's with good bony landmarks and cone of light. Non erythematous.  Oral mucosa pink and moist, no tonsillar enlargement or exudate. Posterior pharynx patent and nonerythematous, no uvula deviation or swelling. Normal phonation.  Eyes: Conjunctivae are normal.  Neck: Neck supple.  Cardiovascular: Normal rate and regular rhythm.  No murmur heard. Pulmonary/Chest: Effort normal and breath  sounds normal. No respiratory distress.  Breathing comfortably at rest, CTABL, no wheezing, rales or other adventitious sounds auscultated  Abdominal: Soft. There is no tenderness.  Musculoskeletal: She exhibits no  edema.  Neurological: She is alert.  Skin: Skin is warm and dry.  Psychiatric: She has a normal mood and affect.  Nursing note and vitals reviewed.    UC Treatments / Results  Labs (all labs ordered are listed, but only abnormal results are displayed) Labs Reviewed - No data to display  EKG None  Radiology No results found.  Procedures Procedures (including critical care time)  Medications Ordered in UC Medications - No data to display  Initial Impression / Assessment and Plan / UC Course  I have reviewed the triage vital signs and the nursing notes.  Pertinent labs & imaging results that were available during my care of the patient were reviewed by me and considered in my medical decision making (see chart for details).     Exam unremarkable, vital signs stable, URI symptoms for 4 to 5 days.  Most likely viral etiology versus allergic rhinitis.  Will begin patient on daily allergy pill, Zyrtec provided to help with drainage thick may be contributing to sore throat.  Flonase for congestion, Robitussin-DM for cough.  Provided list of OTC medicines that are safe in pregnancy.  Not concerning for flu at this time as lacking fever.  Advised to continue to monitor symptoms, follow-up if symptoms not having any improvement over the next 4 to 5 days.Discussed strict return precautions. Patient verbalized understanding and is agreeable with plan.  Final Clinical Impressions(s) / UC Diagnoses   Final diagnoses:  Viral URI with cough     Discharge Instructions     For your congestion please begin taking daily allergy pill like Zyrtec and Flonase. Robitussin-DM for cough. Please use the list provided a list for further over-the-counter medicines save for  pregnancy Please use Tylenol for any body aches and fevers  Please continue to monitor your symptoms and follow-up if symptoms worsening, not improving, developing difficulty breathing, shortness of breath, persistent fevers.  I would expect her symptoms to gradually improve over the next week.  Please follow-up if not having any improvement towards the end of the weekend.   ED Prescriptions    Medication Sig Dispense Auth. Provider   Cetirizine HCl 10 MG CAPS Take 1 capsule (10 mg total) by mouth daily for 10 days. 10 capsule Amdrew Oboyle C, PA-C   fluticasone (FLONASE) 50 MCG/ACT nasal spray Place 1-2 sprays into both nostrils daily for 7 days. 1 g Danikah Budzik C, PA-C   guaiFENesin-dextromethorphan (ROBITUSSIN DM) 100-10 MG/5ML syrup Take 5 mLs by mouth every 4 (four) hours as needed for cough. 118 mL Jamonta Goerner C, PA-C     Controlled Substance Prescriptions [place] Controlled Substance Registry consulted? Not Applicable   Lew Dawes, [place] 12/25/17 1051

## 2017-12-25 NOTE — ED Provider Notes (Signed)
[facility]-EMERGENCY DEPT Provider Note   CSN: [id number] Arrival date & time: 12/25/17  1535     History   Chief Complaint Chief Complaint  Patient presents with  . Flank Pain    HPI Mindy Wade is a 37 y.o. female.  HPI  37 year old female who is G9P6 who is currently [redacted] weeks pregnant presents with right low back pain.  She is had this pain on and off for a long time but it is worse today.  Started around 9 AM.  It is pretty much constant.  At times it has been very severe although currently it is minimal.  She took Tylenol but it did not seem to help.  There are no abdominal or urinary symptoms.  No vaginal bleeding or leakage of fluid.  No fevers or shortness of breath.  She has chronic chest pain that she states is unchanged.  No midline back pain.  She denies any incontinence or leg weakness/numbness.  Pain is worse with certain types of movements or positions.  Past Medical History:  Diagnosis Date  . Breast feeding status of mother   . Hx of blood clots   . Hx of varicella   . Hyperlipemia   . Hypertension   . Pregnancy induced hypertension   . Pulmonary embolism [facility])     Patient Active Problem List   Diagnosis Date Noted  . Missed abortion with fetal demise before 20 completed weeks of gestation 05/03/2017  . Precipitous delivery 02/19/2015  . Postpartum care following vaginal delivery 02/19/2015  . Active labor at term 02/18/2015  . Pre-diabetes 05/14/2014  . Elevated LDL cholesterol level 05/14/2014  . Pulmonary embolism (HCC) 10/17/2013  . Labor and delivery indication for care or intervention 10/09/2013  . Spontaneous abortion in second trimester 10/09/2013  . SVD (spontaneous vaginal delivery) 03/02/2013    Past Surgical History:  Procedure Laterality Date  . CHOLECYSTECTOMY       OB History    Gravida  9   Para  7   Term  6   Preterm  1   AB  1   Living  6     SAB  1   TAB      Ectopic      Multiple    0   Live Births  7            Home Medications    Prior to Admission medications   Medication Sig Start Date End Date Taking? Authorizing Provider  enoxaparin (LOVENOX) 40 MG/0.4ML injection Inject 40 mg into the skin daily.   Yes [provider]  fluticasone (FLONASE) 50 MCG/ACT nasal spray Place 1-2 sprays into both nostrils daily for 7 days. 12/24/17 12/31/17 Yes Wieters, Hallie C, PA-C  folic acid (FOLVITE) 1 MG tablet Take 1 mg by mouth daily.   Yes [provider]  labetalol (NORMODYNE) 200 MG tablet Take 200 mg by mouth 2 (two) times daily. Reported on 05/02/2015   Yes [provider]  Prenatal Vit-Fe Fumarate-FA (PRENATAL MULTIVITAMIN) TABS tablet Take 1 tablet by mouth daily at 12 noon.   Yes [provider]  Cetirizine HCl 10 MG CAPS Take 1 capsule (10 mg total) by mouth daily for 10 days. Patient not taking: Reported on 12/25/2017 12/24/17 01/03/18  Wieters, Hallie C, PA-C  famotidine (PEPCID) 20 MG tablet Take 1 tablet (20 mg total) by mouth 2 (two) times daily. Patient not taking: Reported on 12/25/2017 12/01/17   Gerhard Munch,  MD  guaiFENesin-dextromethorphan (ROBITUSSIN DM) 100-10 MG/5ML syrup Take 5 mLs by mouth every 4 (four) hours as needed for cough. 12/24/17   Wieters, Junius Creamer, PA-C    Family History Family History  Problem Relation Age of Onset  . Asthma Mother   . Heart disease Mother   . Hypertension Mother   . Stroke Mother   . Cancer Father        prostate  . Alcohol abuse Neg Hx   . Arthritis Neg Hx   . Birth defects Neg Hx   . COPD Neg Hx   . Depression Neg Hx   . Diabetes Neg Hx   . Drug abuse Neg Hx   . Early death Neg Hx   . Hearing loss Neg Hx   . Hyperlipidemia Neg Hx   . Kidney disease Neg Hx   . Learning disabilities Neg Hx   . Mental illness Neg Hx   . Mental retardation Neg Hx   . Miscarriages / Stillbirths Neg Hx   . Vision loss Neg Hx   . Varicose Veins Neg Hx     Social History Social  History   Tobacco Use  . Smoking status: Never Smoker  . Smokeless tobacco: Never Used  Substance Use Topics  . Alcohol use: No  . Drug use: No     Allergies   Penicillins   Review of Systems Review of Systems  Constitutional: Negative for fever.  Respiratory: Negative for shortness of breath.   Gastrointestinal: Negative for abdominal pain and vomiting.  Genitourinary: Negative for dysuria, hematuria and vaginal bleeding.  Musculoskeletal: Positive for back pain.  Neurological: Negative for weakness and numbness.  All other systems reviewed and are negative.    Physical Exam Updated Vital Signs BP 124/72 (BP Location: Left Arm)   Pulse 72   Temp 98.8 F (37.1 C) (Oral)   Resp 16   Ht 5\' 8"  (1.727 m)   Wt 110.7 kg   LMP 08/09/2017   SpO2 100%   BMI 37.10 kg/m   Physical Exam  Constitutional: She appears well-developed and well-nourished. No distress.  HENT:  Head: Normocephalic and atraumatic.  Right Ear: External ear normal.  Left Ear: External ear normal.  Nose: Nose normal.  Eyes: Right eye exhibits no discharge. Left eye exhibits no discharge.  Cardiovascular: Normal rate, regular rhythm and normal heart sounds.  Pulmonary/Chest: Effort normal and breath sounds normal.  Abdominal: Soft. There is no tenderness. There is no CVA tenderness.  Musculoskeletal:       Lumbar back: She exhibits no bony tenderness.       Back:  Neurological: She is alert.  5/5 strength in BLE. Normal gross sensation  Skin: Skin is warm and dry. She is not diaphoretic.  Psychiatric: Her mood appears not anxious.  Nursing note and vitals reviewed.    ED Treatments / Results  Labs (all labs ordered are listed, but only abnormal results are displayed) Labs Reviewed  URINALYSIS, ROUTINE W REFLEX MICROSCOPIC - Abnormal; Notable for the following components:      Result Value   Ketones, ur 5 (*)    All other components within normal limits  URINE CULTURE     EKG None  Radiology No results found.  Procedures Procedures (including critical care time)  Medications Ordered in ED Medications - No data to display   Initial Impression / Assessment and Plan / ED Course  I have reviewed the triage vital signs and the nursing notes.  Pertinent  labs & imaging results that were available during my care of the patient were reviewed by me and considered in my medical decision making (see chart for details).     Given this is a recurrent issue this is probably muscular back pain.  My suspicion for ureteral colic, pyelonephritis, or other acute intra-abdominal emergency is pretty low.  I do not think emergent imaging would be beneficial.  I have advised her to use heat as well as Tylenol.  No signs or symptoms to suggest spinal cord emergency.  I doubt this is a pregnancy problem.  Given benign vital signs, I think is stable to discharge her home with expectant management and follow-up with OB.  Return precautions.  Final Clinical Impressions(s) / ED Diagnoses   Final diagnoses:  Acute right-sided low back pain without sciatica    ED Discharge Orders    None       Pricilla Loveless, MD 12/25/17 1929

## 2017-12-27 LAB — URINE CULTURE: Culture: >=100000 — AB

## 2017-12-28 ENCOUNTER — Telehealth: Payer: Self-pay

## 2017-12-28 NOTE — Telephone Encounter (Signed)
PT called and instructed to have UC done at GYN/OB office with F/U per Army Melia [facility]

## 2017-12-28 NOTE — Progress Notes (Signed)
ED Antimicrobial Stewardship Positive Culture Follow Up   Mindy Wade is an 37 y.o. female who presented to [facility] on 12/25/2017 with a chief complaint of  Chief Complaint  Patient presents with  . Flank Pain   Recent Results (from the past 720 hour(s))  Urine C&S     Status: Abnormal   Collection Time: 12/25/17  4:07 PM  Result Value Ref Range Status   Specimen Description   Final    URINE, CLEAN CATCH Performed at [facility], [address], [place], [place] [address]    Special Requests   Final    NONE Performed at [facility], [address], [place], [place] [address]    Culture >=100,000 COLONIES/mL LACTOBACILLUS SPECIES (A)  Final   Report Status 12/27/2017 FINAL  Final   Given lack of symptoms and negative UA, suspect culture result is due to contamination. However, as patient is pregnant, would recommend follow up urine culture at next OB visit and if results are consistent, may require treatment.  ED Provider: Army Melia, PA-C  Roderic Scarce Zigmund Daniel, PharmD PGY2 Infectious Diseases Pharmacy Resident Phone: [phone] 12/28/2017, 9:45 AM

## 2018-01-05 ENCOUNTER — Encounter (HOSPITAL_COMMUNITY): Payer: Self-pay | Admitting: Emergency Medicine

## 2018-01-05 ENCOUNTER — Other Ambulatory Visit: Payer: Self-pay

## 2018-01-05 ENCOUNTER — Emergency Department (HOSPITAL_COMMUNITY)
Admission: EM | Admit: 2018-01-05 | Discharge: 2018-01-05 | Disposition: A | Discharge status: Home / Self Care | Payer: 59 | Attending: Emergency Medicine | Admitting: Emergency Medicine

## 2018-01-05 DIAGNOSIS — Z7901 Long term (current) use of anticoagulants: Secondary | ICD-10-CM | POA: Diagnosis not present

## 2018-01-05 DIAGNOSIS — O132 Gestational [pregnancy-induced] hypertension without significant proteinuria, second trimester: Secondary | ICD-10-CM | POA: Diagnosis not present

## 2018-01-05 DIAGNOSIS — Z3A22 22 weeks gestation of pregnancy: Secondary | ICD-10-CM | POA: Diagnosis not present

## 2018-01-05 DIAGNOSIS — Z79899 Other long term (current) drug therapy: Secondary | ICD-10-CM | POA: Insufficient documentation

## 2018-01-05 DIAGNOSIS — R251 Tremor, unspecified: Secondary | ICD-10-CM | POA: Diagnosis present

## 2018-01-05 NOTE — ED Notes (Signed)
Fetal heart tones auscultated: 153 bpm

## 2018-01-05 NOTE — ED Provider Notes (Signed)
[facility] EMERGENCY DEPARTMENT Provider Note   CSN: [id number] Arrival date & time: 01/05/18  0229     History   Chief Complaint Chief Complaint  Patient presents with  . Hypertension    HPI Mindy Wade is a 37 y.o. female.  37yo F w/ PMH including HTN, PE, HLD, currently [redacted] wk pregnant who p/w HTN.  Patient states that she ate a waffle late night and shortly afterwards she began feeling very shaky and weird.  She checked her blood pressure and noted that it was elevated to 150s systolic.  This got her concerned and prompted her to come in for evaluation.  She reports compliance with all of her medications and follows closely with her OB/GYN.  She denies any complaints including no headache, chest pain, breathing problems, abdominal pain, LE Edema, vaginal bleeding/passage of fluid.  No excessive caffeine use.  The history is provided by the patient.  Hypertension     Past Medical History:  Diagnosis Date  . Breast feeding status of mother   . Hx of blood clots   . Hx of varicella   . Hyperlipemia   . Hypertension   . Pregnancy induced hypertension   . Pulmonary embolism [facility])     Patient Active Problem List   Diagnosis Date Noted  . Missed abortion with fetal demise before 20 completed weeks of gestation 05/03/2017  . Precipitous delivery 02/19/2015  . Postpartum care following vaginal delivery 02/19/2015  . Active labor at term 02/18/2015  . Pre-diabetes 05/14/2014  . Elevated LDL cholesterol level 05/14/2014  . Pulmonary embolism (HCC) 10/17/2013  . Labor and delivery indication for care or intervention 10/09/2013  . Spontaneous abortion in second trimester 10/09/2013  . SVD (spontaneous vaginal delivery) 03/02/2013    Past Surgical History:  Procedure Laterality Date  . CHOLECYSTECTOMY       OB History    Gravida  9   Para  7   Term  6   Preterm  1   AB  1   Living  6     SAB  1   TAB      Ectopic      Multiple    0   Live Births  7            Home Medications    Prior to Admission medications   Medication Sig Start Date End Date Taking? Authorizing Provider  Cetirizine HCl 10 MG CAPS Take 1 capsule (10 mg total) by mouth daily for 10 days. Patient not taking: Reported on 12/25/2017 12/24/17 01/03/18  Wieters, Hallie C, PA-C  enoxaparin (LOVENOX) 40 MG/0.4ML injection Inject 40 mg into the skin daily.    [provider]  famotidine (PEPCID) 20 MG tablet Take 1 tablet (20 mg total) by mouth 2 (two) times daily. Patient not taking: Reported on 12/25/2017 12/01/17   Gerhard Munch, MD  fluticasone [facility]) 50 MCG/ACT nasal spray Place 1-2 sprays into both nostrils daily for 7 days. 12/24/17 12/31/17  Wieters, Hallie C, PA-C  folic acid (FOLVITE) 1 MG tablet Take 1 mg by mouth daily.    [provider]  guaiFENesin-dextromethorphan (ROBITUSSIN DM) 100-10 MG/5ML syrup Take 5 mLs by mouth every 4 (four) hours as needed for cough. 12/24/17   Wieters, Hallie C, PA-C  labetalol (NORMODYNE) 200 MG tablet Take 200 mg by mouth 2 (two) times daily. Reported on 05/02/2015    [provider]  Prenatal Vit-Fe Fumarate-FA (PRENATAL MULTIVITAMIN) TABS tablet Take  1 tablet by mouth daily at 12 noon.    [provider]    Family History Family History  Problem Relation Age of Onset  . Asthma Mother   . Heart disease Mother   . Hypertension Mother   . Stroke Mother   . Cancer Father        prostate  . Alcohol abuse Neg Hx   . Arthritis Neg Hx   . Birth defects Neg Hx   . COPD Neg Hx   . Depression Neg Hx   . Diabetes Neg Hx   . Drug abuse Neg Hx   . Early death Neg Hx   . Hearing loss Neg Hx   . Hyperlipidemia Neg Hx   . Kidney disease Neg Hx   . Learning disabilities Neg Hx   . Mental illness Neg Hx   . Mental retardation Neg Hx   . Miscarriages / Stillbirths Neg Hx   . Vision loss Neg Hx   . Varicose Veins Neg Hx     Social History Social History    Tobacco Use  . Smoking status: Never Smoker  . Smokeless tobacco: Never Used  Substance Use Topics  . Alcohol use: No  . Drug use: No     Allergies   Penicillins   Review of Systems Review of Systems All other systems reviewed and are negative except that which was mentioned in HPI   Physical Exam Updated Vital Signs BP 139/82   Pulse 99   Temp 98.3 F (36.8 C) (Oral)   Resp 17   Ht 5\' 8"  (1.727 m)   Wt 110.7 kg   LMP 08/09/2017   SpO2 100%   BMI 37.10 kg/m   Physical Exam  Constitutional: She is oriented to person, place, and time. She appears well-developed and well-nourished. No distress.  Pleasant, comfortable  HENT:  Head: Normocephalic and atraumatic.  Moist mucous membranes  Eyes: Conjunctivae are normal.  Neck: Neck supple.  Cardiovascular: Normal rate, regular rhythm and normal heart sounds.  No murmur heard. Pulmonary/Chest: Effort normal and breath sounds normal.  Abdominal: Soft. Bowel sounds are normal. She exhibits no distension. There is no tenderness.  Musculoskeletal: She exhibits no edema.  Neurological: She is alert and oriented to person, place, and time.  Fluent speech  Skin: Skin is warm and dry.  Psychiatric: She has a normal mood and affect. Judgment normal.  Nursing note and vitals reviewed.    ED Treatments / Results  Labs (all labs ordered are listed, but only abnormal results are displayed) Labs Reviewed - No data to display  EKG EKG Interpretation  Date/Time:  Sunday January 05 2018 02:38:59 EST Ventricular Rate:  94 PR Interval:    QRS Duration: 91 QT Interval:  345 QTC Calculation: 432 R Axis:   95 Text Interpretation:  Sinus rhythm Borderline right axis deviation No significant change since last tracing Confirmed by Frederick Peers [phone]) on 01/05/2018 2:43:46 AM   Radiology No results found.  Procedures Procedures (including critical care time)  Medications Ordered in ED Medications - No data to  display   Initial Impression / Assessment and Plan / ED Course  I have reviewed the triage vital signs and the nursing notes.      Well appearing and comfortable on exam, no complaints. FHR normal at 150s. No pregnancy-related complaints. BP normalized with no intervention. Recommended daily BP journal and close OB f/u for reassessment while continuing all meds as prescribed. Return precautions reviewed.  Final  Clinical Impressions(s) / ED Diagnoses   Final diagnoses:  Transient hypertension of pregnancy in second trimester    ED Discharge Orders    None       Stina Gane, Ambrose [place], MD 01/05/18 2304

## 2018-01-05 NOTE — ED Triage Notes (Addendum)
Patient from home, states she started having "temors" at home, she checked her blood pressure and it was high. Pt has hx of HTN and takes beta blocker regularly and is compliant with medications. Patient is [redacted] weeks pregnant. No new swelling noted and no HA, c/o intermittent dizziness every now and then. Patient AOx4. BP 141/98.

## 2018-01-05 NOTE — ED Notes (Signed)
Patient verbalizes understanding of discharge instructions. Opportunity for questioning and answers were provided. Armband removed by staff, pt discharged from ED.  

## 2018-01-28 ENCOUNTER — Encounter (HOSPITAL_COMMUNITY): Payer: Self-pay | Admitting: Emergency Medicine

## 2018-01-28 ENCOUNTER — Emergency Department (HOSPITAL_COMMUNITY): Payer: 59

## 2018-01-28 ENCOUNTER — Other Ambulatory Visit: Payer: Self-pay

## 2018-01-28 ENCOUNTER — Emergency Department (HOSPITAL_COMMUNITY)
Admission: EM | Admit: 2018-01-28 | Discharge: 2018-01-29 | Disposition: A | Discharge status: Home / Self Care | Payer: 59 | Attending: Emergency Medicine | Admitting: Emergency Medicine

## 2018-01-28 DIAGNOSIS — O132 Gestational [pregnancy-induced] hypertension without significant proteinuria, second trimester: Secondary | ICD-10-CM | POA: Insufficient documentation

## 2018-01-28 DIAGNOSIS — O09522 Supervision of elderly multigravida, second trimester: Secondary | ICD-10-CM | POA: Insufficient documentation

## 2018-01-28 DIAGNOSIS — Z3A25 25 weeks gestation of pregnancy: Secondary | ICD-10-CM | POA: Diagnosis not present

## 2018-01-28 DIAGNOSIS — O99810 Abnormal glucose complicating pregnancy: Secondary | ICD-10-CM | POA: Insufficient documentation

## 2018-01-28 DIAGNOSIS — M79602 Pain in left arm: Secondary | ICD-10-CM | POA: Diagnosis not present

## 2018-01-28 DIAGNOSIS — O26892 Other specified pregnancy related conditions, second trimester: Secondary | ICD-10-CM | POA: Insufficient documentation

## 2018-01-28 DIAGNOSIS — Z79899 Other long term (current) drug therapy: Secondary | ICD-10-CM | POA: Diagnosis not present

## 2018-01-28 DIAGNOSIS — Z86711 Personal history of pulmonary embolism: Secondary | ICD-10-CM | POA: Insufficient documentation

## 2018-01-28 DIAGNOSIS — R0789 Other chest pain: Secondary | ICD-10-CM | POA: Diagnosis present

## 2018-01-28 LAB — I-STAT TROPONIN, ED: Troponin i, poc: 0 ng/mL (ref 0.00–0.08)

## 2018-01-28 MED ORDER — SODIUM CHLORIDE 0.9 % IV BOLUS
1000.0000 mL | Freq: Once | INTRAVENOUS | Status: AC
  Administered 2018-01-29: 1000 mL via INTRAVENOUS

## 2018-01-28 NOTE — ED Notes (Signed)
Called [facility] @2310  per RN [name] .

## 2018-01-28 NOTE — Progress Notes (Signed)
Dr Senaida Oresichardson on unit and notified pt [facility] with c/o chest pain at [redacted] weeks gestation. ED provider has requested OB rapid response to come assess pt. ED nurse plans to doppler FHT's and call me back once they are obtained. Orders received from Dr Senaida Oresichardson OB rapid response dose not need to go assess pt at this time as doppler by ED nurse is sufficient.

## 2018-01-28 NOTE — ED Provider Notes (Signed)
[facility]-EMERGENCY DEPT Provider Note   CSN: [id number] Arrival date & time: 01/28/18  2150     History   Chief Complaint Chief Complaint  Patient presents with  . Chest Pain    HPI Mindy PocheValiencia Wade is a 37 y.o. female [phone], currently pregnant, with a past medical history of pregnancy-induced hypertension, PE after delivery of a severely premature infant that did not survive, currently treated with lovenox injections, has not missed any doses, who presents today for evaluation of intermittent left-sided chest pain and left arm pain.  She reports that she was at work today when she felt like her heart stopped for a few seconds.  She denies passing out, being lightheaded dizzy nauseous vomiting or other symptoms with this.  She has also been having left arm pain, mostly in the axilla and shoulder area not rating down into her arm.  This left arm pain has been going on today.  She currently does not have any pain in her left arm or in her chest.  She denies shortness of breath.    Chart review shows that previous PE was thought to be secondary to to her previous delivery with intrauterine fetal demise.    HPI  Past Medical History:  Diagnosis Date  . Breast feeding status of mother   . Hx of blood clots   . Hx of varicella   . Hyperlipemia   . Hypertension   . Pregnancy induced hypertension   . Pulmonary embolism [facility](HCC)     Patient Active Problem List   Diagnosis Date Noted  . Missed abortion with fetal demise before 20 completed weeks of gestation 05/03/2017  . Precipitous delivery 02/19/2015  . Postpartum care following vaginal delivery 02/19/2015  . Active labor at term 02/18/2015  . Pre-diabetes 05/14/2014  . Elevated LDL cholesterol level 05/14/2014  . Pulmonary embolism (HCC) 10/17/2013  . Labor and delivery indication for care or intervention 10/09/2013  . Spontaneous abortion in second trimester 10/09/2013  . SVD (spontaneous vaginal  delivery) 03/02/2013    Past Surgical History:  Procedure Laterality Date  . CHOLECYSTECTOMY       OB History    Gravida  9   Para  7   Term  6   Preterm  1   AB  1   Living  6     SAB  1   TAB      Ectopic      Multiple  0   Live Births  7            Home Medications    Prior to Admission medications   Medication Sig Start Date End Date Taking? Authorizing Provider  enoxaparin (LOVENOX) 40 MG/0.4ML injection Inject 40 mg into the skin daily.   Yes [provider]  labetalol (NORMODYNE) 200 MG tablet Take 200 mg by mouth 2 (two) times daily. Reported on 05/02/2015   Yes [provider]  Prenat-FePoly-Metf-FA-DHA-DSS (VITAFOL FE+) 90-1-200 & 50 MG CPPK Take 1 capsule by mouth daily.  01/13/18  Yes [provider]  Cetirizine HCl 10 MG CAPS Take 1 capsule (10 mg total) by mouth daily for 10 days. Patient not taking: Reported on 12/25/2017 12/24/17 01/03/18  Wieters, Hallie C, PA-C  famotidine (PEPCID) 20 MG tablet Take 1 tablet (20 mg total) by mouth 2 (two) times daily. Patient not taking: Reported on 12/25/2017 12/01/17   Gerhard MunchLockwood, Robert, MD  fluticasone [facility](FLONASE) 50 MCG/ACT nasal spray Place 1-2 sprays into both  nostrils daily for 7 days. Patient not taking: Reported on 01/29/2018 12/24/17 12/31/17  Wieters, Hallie C, PA-C  guaiFENesin-dextromethorphan (ROBITUSSIN DM) 100-10 MG/5ML syrup Take 5 mLs by mouth every 4 (four) hours as needed for cough. Patient not taking: Reported on 01/29/2018 12/24/17   Lew Dawes, PA-C    Family History Family History  Problem Relation Age of Onset  . Asthma Mother   . Heart disease Mother   . Hypertension Mother   . Stroke Mother   . Cancer Father        prostate  . Alcohol abuse Neg Hx   . Arthritis Neg Hx   . Birth defects Neg Hx   . COPD Neg Hx   . Depression Neg Hx   . Diabetes Neg Hx   . Drug abuse Neg Hx   . Early death Neg Hx   . Hearing loss Neg Hx   . Hyperlipidemia Neg Hx    . Kidney disease Neg Hx   . Learning disabilities Neg Hx   . Mental illness Neg Hx   . Mental retardation Neg Hx   . Miscarriages / Stillbirths Neg Hx   . Vision loss Neg Hx   . Varicose Veins Neg Hx     Social History Social History   Tobacco Use  . Smoking status: Never Smoker  . Smokeless tobacco: Never Used  Substance Use Topics  . Alcohol use: No  . Drug use: No     Allergies   Penicillins   Review of Systems Review of Systems  Constitutional: Negative for chills and fever.  HENT: Negative for congestion.   Respiratory: Negative for chest tightness and shortness of breath.   Cardiovascular: Positive for chest pain and palpitations. Negative for leg swelling.  Gastrointestinal: Positive for nausea. Negative for abdominal pain, diarrhea and vomiting.  Genitourinary: Negative for dysuria, flank pain, frequency and urgency.  Musculoskeletal: Positive for back pain (Unchanged from her normal with this pregnancy. ).  Neurological: Negative for dizziness, syncope, light-headedness and headaches.  All other systems reviewed and are negative.    Physical Exam Updated Vital Signs BP 117/74 (BP Location: Left Arm)   Pulse 81   Temp 98.2 F (36.8 C) (Oral)   Resp 20   Ht 5\' 8"  (1.727 m)   Wt 112.5 kg   LMP 08/09/2017   SpO2 100%   BMI 37.71 kg/m   Physical Exam  Constitutional: She appears well-developed and well-nourished.  Non-toxic appearance. No distress.  HENT:  Head: Normocephalic and atraumatic.  Eyes: Conjunctivae are normal.  Neck: Normal range of motion. Neck supple.  Cardiovascular: Normal rate, regular rhythm, intact distal pulses and normal pulses.  No murmur heard. Pulses:      Radial pulses are 2+ on the right side, and 2+ on the left side.  Pulmonary/Chest: Effort normal and breath sounds normal. No stridor. No respiratory distress. She has no decreased breath sounds. She has no wheezes. She has no rhonchi. She has no rales.  Mild left  anterior chest TTP.    Abdominal: Soft. Bowel sounds are normal. She exhibits no distension. There is no tenderness.  Gravid, uterus palpable around the level of the umbilicus.   Musculoskeletal: She exhibits no edema.       Right lower leg: Normal. She exhibits no tenderness and no edema.       Left lower leg: Normal. She exhibits no tenderness and no edema.  Neurological: She is alert.  Skin: Skin is warm and dry.  Psychiatric: She has a normal mood and affect.  Nursing note and vitals reviewed.    ED Treatments / Results  Labs (all labs ordered are listed, but only abnormal results are displayed) Labs Reviewed  COMPREHENSIVE METABOLIC PANEL - Abnormal; Notable for the following components:      Result Value   Albumin 3.0 (*)    All other components within normal limits  CBC WITH DIFFERENTIAL/PLATELET - Abnormal; Notable for the following components:   RBC 3.25 (*)    Hemoglobin 10.3 (*)    HCT 31.7 (*)    All other components within normal limits  HCG, QUANTITATIVE, PREGNANCY - Abnormal; Notable for the following components:   hCG, Beta ChainSharene Butters, [place] 16,109 (*)    All other components within normal limits  I-STAT TROPONIN, ED    EKG EKG Interpretation  Date/Time:  Tuesday January 28 2018 22:19:08 EST Ventricular Rate:  86 PR Interval:    QRS Duration: 87 QT Interval:  343 QTC Calculation: 411 R Axis:   67 Text Interpretation:  Sinus rhythm Normal ECG When compared with ECG of 01/05/2018, Rightward axis is no longer present Confirmed by Dione Booze ([id number]) on 01/28/2018 11:24:04 PM Also confirmed by Dione Booze ([id number]), editor Barbette Hair [phone])  on 01/29/2018 7:03:11 AM   Radiology Dg Chest 2 View  Result Date: 01/28/2018 CLINICAL DATA:  Sudden onset left anterior chest pain EXAM: CHEST - 2 VIEW COMPARISON:  12/01/2017 FINDINGS: Heart is borderline in size. No confluent airspace opacities or effusions. No acute bony abnormality. IMPRESSION: No active  cardiopulmonary disease. Electronically Signed   By: Charlett Nose M.D.   On: 01/28/2018 23:09    Procedures Procedures (including critical care time)  Medications Ordered in ED Medications  sodium chloride 0.9 % bolus 1,000 mL (0 mLs Intravenous Stopped 01/29/18 0310)     Initial Impression / Assessment and Plan / ED Course  I have reviewed the triage vital signs and the nursing notes.  Pertinent labs & imaging results that were available during my care of the patient were reviewed by me and considered in my medical decision making (see chart for details).  Clinical Course as of Jan 29 738  Tue Jan 28, 2018  2312 Dr. Roney Jaffe OB/GYN   [EH]    Clinical Course User Index [EH] Cristina Gong, PA-C   Mindy Poche presents today for evaluation of left sided chest pain and feeling like her heart stopped for a few seconds.  She does not have any pain in her chest or her left arm at the time of arrival, says that this was brief and only lasted for a few seconds to a minute.  Hemoglobin is mildly low at 10.3, however suspect delusional related to pregnancy.  Fetal heart tones were dopplered by RN.  Troponin is not elevated.  No significant electrolyte abnormalities.  Her blood pressure is not significantly elevated.  Chest x-ray does not show acute abnormalities.  She is taking Lovenox due to her history of prior PE during pregnancy, however this was after she had a missed abortion with intrauterine fetal demise and felt to be secondary to the resulting coagulopathy.  She has not missed any Lovenox doses.  Plan is to follow up with outpatient bilateral venous duplex ultrasounds to evaluate for DVT.  This was not placed initially at time of discharge, please see notes from phone conversation.  Orders were placed.    Return precautions were discussed with patient who states their understanding.  At the time of discharge patient denied any unaddressed complaints or concerns.   Patient is agreeable for discharge home.   Final Clinical Impressions(s) / ED Diagnoses   Final diagnoses:  Atypical chest pain    ED Discharge Orders    None       Norman Clay 01/29/18 0739    Dione Booze, MD 01/29/18 [phone]

## 2018-01-28 NOTE — Progress Notes (Signed)
[name], RN and requested info on where [facility] and to ensure pt has no pregnancy related complaints. [name] staets pt sees [facility] Mind[facility]Banga [facility] and pt denies any pregnancy related complaints. Asked if FHT have been obtained via doppler and [name] stated no but she will be obtaining them soon. Requested [name] to call me back once FHT's are obtained and I would notify Dr Senaida Oresichardson to see if pt needs OB rapid response to see pt since there are no pregnancy related concerns.

## 2018-01-28 NOTE — Progress Notes (Signed)
[name], RN called and notified FHT were 148 bpm via doppler. Notified Dr Senaida Oresichardson was [facility] and notified of pts arrival with c/o chest pain at 25 weeks and providers request for OBRR to come assess pt but there is no need for further assessment at this time. Notified [name] if any other concerns arise to please call OB rapid response back.

## 2018-01-28 NOTE — ED Triage Notes (Signed)
Pt arriving from home with complaint of left sided chest pain and left arm pain. Pt reports her job is very physical and while at work she felt as if her heart stopped. Pt also states her left arm hurts but believes it may be related to her job.

## 2018-01-29 ENCOUNTER — Encounter (HOSPITAL_COMMUNITY): Payer: Self-pay | Admitting: Emergency Medicine

## 2018-01-29 ENCOUNTER — Telehealth (HOSPITAL_COMMUNITY): Payer: Self-pay | Admitting: Physician Assistant

## 2018-01-29 ENCOUNTER — Emergency Department (HOSPITAL_COMMUNITY): Payer: 59

## 2018-01-29 ENCOUNTER — Other Ambulatory Visit: Payer: Self-pay

## 2018-01-29 ENCOUNTER — Ambulatory Visit (HOSPITAL_COMMUNITY)
Admission: RE | Admit: 2018-01-29 | Discharge: 2018-01-29 | Disposition: A | Discharge status: Home / Self Care | Payer: 59 | Source: Ambulatory Visit | Attending: Physician Assistant | Admitting: Physician Assistant

## 2018-01-29 ENCOUNTER — Emergency Department (HOSPITAL_COMMUNITY)
Admission: EM | Admit: 2018-01-29 | Discharge: 2018-01-30 | Disposition: A | Discharge status: Home / Self Care | Payer: 59 | Source: Home / Self Care | Attending: Emergency Medicine | Admitting: Emergency Medicine

## 2018-01-29 ENCOUNTER — Ambulatory Visit (HOSPITAL_COMMUNITY): Admission: RE | Admit: 2018-01-29 | Payer: 59 | Source: Ambulatory Visit

## 2018-01-29 DIAGNOSIS — Z86711 Personal history of pulmonary embolism: Secondary | ICD-10-CM | POA: Insufficient documentation

## 2018-01-29 DIAGNOSIS — R079 Chest pain, unspecified: Secondary | ICD-10-CM

## 2018-01-29 LAB — CBC WITH DIFFERENTIAL/PLATELET
ABS IMMATURE GRANULOCYTES: 0.05 10*3/uL (ref 0.00–0.07)
BASOS ABS: 0 10*3/uL (ref 0.0–0.1)
Basophils Relative: 0 %
Eosinophils Absolute: 0.1 10*3/uL (ref 0.0–0.5)
Eosinophils Relative: 1 %
HEMATOCRIT: 31.7 % — AB (ref 36.0–46.0)
HEMOGLOBIN: 10.3 g/dL — AB (ref 12.0–15.0)
Immature Granulocytes: 1 %
LYMPHS PCT: 28 %
Lymphs Abs: 2.7 10*3/uL (ref 0.7–4.0)
MCH: 31.7 pg (ref 26.0–34.0)
MCHC: 32.5 g/dL (ref 30.0–36.0)
MCV: 97.5 fL (ref 80.0–100.0)
MONO ABS: 0.8 10*3/uL (ref 0.1–1.0)
Monocytes Relative: 8 %
NEUTROS ABS: 6.1 10*3/uL (ref 1.7–7.7)
Neutrophils Relative %: 62 %
Platelets: 242 10*3/uL (ref 150–400)
RBC: 3.25 MIL/uL — AB (ref 3.87–5.11)
RDW: 13.2 % (ref 11.5–15.5)
WBC: 9.7 10*3/uL (ref 4.0–10.5)
nRBC: 0 % (ref 0.0–0.2)

## 2018-01-29 LAB — COMPREHENSIVE METABOLIC PANEL
ALK PHOS: 52 U/L (ref 38–126)
ALT: 18 U/L (ref 0–44)
AST: 17 U/L (ref 15–41)
Albumin: 3 g/dL — ABNORMAL LOW (ref 3.5–5.0)
Anion gap: 8 (ref 5–15)
BILIRUBIN TOTAL: 0.3 mg/dL (ref 0.3–1.2)
BUN: 8 mg/dL (ref 6–20)
CHLORIDE: 107 mmol/L (ref 98–111)
CO2: 23 mmol/L (ref 22–32)
CREATININE: 0.53 mg/dL (ref 0.44–1.00)
Calcium: 9 mg/dL (ref 8.9–10.3)
GLUCOSE: 86 mg/dL (ref 70–99)
POTASSIUM: 3.7 mmol/L (ref 3.5–5.1)
Sodium: 138 mmol/L (ref 135–145)
Total Protein: 6.9 g/dL (ref 6.5–8.1)

## 2018-01-29 LAB — HCG, QUANTITATIVE, PREGNANCY: HCG, BETA CHAIN, QUANT, S: 46562 m[IU]/mL — AB (ref <5)

## 2018-01-29 NOTE — Progress Notes (Signed)
Bilateral lower extremities venous duplex exam completed. Please see CV PROC preliminary notes under chart review.  Devinn Hurwitz H Marisa Hufstetler(RDMS RVT) 01/29/18 .5:40 PM

## 2018-01-29 NOTE — ED Triage Notes (Signed)
Patient is complaining of chest pain and sob of breathe since 9 pm. Patient states it will not go away.

## 2018-01-29 NOTE — Discharge Instructions (Signed)
Please follow-up with both your primary care doctor and your OB/GYN.  If your chest pain returns, you develop shortness of breath, new symptoms or have concerns please seek additional medical care and evaluation.

## 2018-01-29 NOTE — Telephone Encounter (Signed)
Called patient, she identified herself appropriately.  Discussed plan to obtain outpatient ultrasound of her bilateral legs to evaluate for possibility of DVT. She states her understanding of instructions to come to [place] and importance of obtaining the ultrasound.  She states her understanding.  Order was ment to be placed at end of ED visit, however was not.  Apologized for the inconvenience.

## 2018-01-30 LAB — I-STAT TROPONIN, ED: Troponin i, poc: 0 ng/mL (ref 0.00–0.08)

## 2018-01-30 LAB — CBC
HCT: 31.3 % — ABNORMAL LOW (ref 36.0–46.0)
Hemoglobin: 10.2 g/dL — ABNORMAL LOW (ref 12.0–15.0)
MCH: 30.8 pg (ref 26.0–34.0)
MCHC: 32.6 g/dL (ref 30.0–36.0)
MCV: 94.6 fL (ref 80.0–100.0)
NRBC: 0 % (ref 0.0–0.2)
Platelets: 257 10*3/uL (ref 150–400)
RBC: 3.31 MIL/uL — ABNORMAL LOW (ref 3.87–5.11)
RDW: 13.2 % (ref 11.5–15.5)
WBC: 8.3 10*3/uL (ref 4.0–10.5)

## 2018-01-30 LAB — BASIC METABOLIC PANEL
Anion gap: 7 (ref 5–15)
BUN: 8 mg/dL (ref 6–20)
CO2: 21 mmol/L — ABNORMAL LOW (ref 22–32)
CREATININE: 0.52 mg/dL (ref 0.44–1.00)
Calcium: 8.5 mg/dL — ABNORMAL LOW (ref 8.9–10.3)
Chloride: 111 mmol/L (ref 98–111)
GFR calc non Af Amer: >60 mL/min (ref >60)
Glucose, Bld: 86 mg/dL (ref 70–99)
Potassium: 3.6 mmol/L (ref 3.5–5.1)
Sodium: 139 mmol/L (ref 135–145)

## 2018-01-30 MED ORDER — CALCIUM CARBONATE ANTACID 500 MG PO CHEW: 1.0000 | CHEWABLE_TABLET | Freq: Every day | ORAL | 0 refills | Status: AC

## 2018-01-30 NOTE — ED Provider Notes (Signed)
[facility]-EMERGENCY DEPT Provider Note   CSN: [id number] Arrival date & time: 01/29/18  2236     History   Chief Complaint Chief Complaint  Patient presents with  . Chest Pain    HPI Mindy Wade is a 37 y.o. 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  Patient reports she was sitting at home watching TV when the chest pain started.  She describes it as a constant pressure sensation across her chest and onto her sides.  She states that initially when it started she got some mild shortness of breath but states it resolved quickly.  She did not have any associated diaphoresis, nausea/vomiting.  She states that the pain was worse with deep inspiration at the beginning.  Currently, she states she is not having any shortness of breath or pleuritic chest pain.  She states that the pain was never worse with any exertion.  She does report that movement makes the pain worse.  She states she did not take any medication for the pain.  On ED arrival, she states that her pain has almost completely resolved.  She was seen here yesterday in the ED for similar symptoms.  Her work-up at that time was unremarkable.  Patient does report she has a history of PEs.  She states that she had a fetal demise and they think that is what caused that PE.  She is on 40 mg of Lovenox a day.  She reports she has not missed any doses.  She does report that she is currently 6 months pregnant.  Patient denies any smoking, cocaine use, IV drug use.  She denies any personal cardiac history.  She does report that her mom had a heart attack but she does not recall which age.  Patient has not had any fevers, cough, congestion.  She denies any swelling of her legs.  Patient denies any abdominal pain, vaginal bleeding.  She reports she is still had good fetal movement.  The history is provided by the  patient.    Past Medical History:  Diagnosis Date  . Breast feeding status of mother   . Hx of blood clots   . Hx of varicella   . Hyperlipemia   . Hypertension   . Pregnancy induced hypertension   . Pulmonary embolism [facility])     Patient Active Problem List   Diagnosis Date Noted  . Missed abortion with fetal demise before 20 completed weeks of gestation 05/03/2017  . Precipitous delivery 02/19/2015  . Postpartum care following vaginal delivery 02/19/2015  . Active labor at term 02/18/2015  . Pre-diabetes 05/14/2014  . Elevated LDL cholesterol level 05/14/2014  . Pulmonary embolism (HCC) 10/17/2013  . Labor and delivery indication for care or intervention 10/09/2013  . Spontaneous abortion in second trimester 10/09/2013  . SVD (spontaneous vaginal delivery) 03/02/2013    Past Surgical History:  Procedure Laterality Date  . CHOLECYSTECTOMY       OB History    Gravida  9   Para  7   Term  6   Preterm  1   AB  1   Living  6     SAB  1   TAB      Ectopic      Multiple  0   Live Births  7            Home Medications    Prior to  Admission medications   Medication Sig Start Date End Date Taking? Authorizing Provider  enoxaparin (LOVENOX) 40 MG/0.4ML injection Inject 40 mg into the skin daily.   Yes [provider]  labetalol (NORMODYNE) 200 MG tablet Take 200 mg by mouth 2 (two) times daily. Reported on 05/02/2015   Yes [provider]  Prenat-FePoly-Metf-FA-DHA-DSS (VITAFOL FE+) 90-1-200 & 50 MG CPPK Take 1 capsule by mouth daily.  01/13/18  Yes [provider]  calcium carbonate (TUMS) 500 MG chewable tablet Chew 1 tablet (200 mg of elemental calcium total) by mouth daily for 10 days. 01/30/18 02/09/18  Maxwell Caul, PA-C  Cetirizine HCl 10 MG CAPS Take 1 capsule (10 mg total) by mouth daily for 10 days. Patient not taking: Reported on 12/25/2017 12/24/17 01/03/18  Wieters, Hallie C, PA-C  famotidine (PEPCID) 20 MG tablet  Take 1 tablet (20 mg total) by mouth 2 (two) times daily. Patient not taking: Reported on 12/25/2017 12/01/17   Gerhard Munch, MD  fluticasone [facility]) 50 MCG/ACT nasal spray Place 1-2 sprays into both nostrils daily for 7 days. Patient not taking: Reported on 01/29/2018 12/24/17 12/31/17  Wieters, Hallie C, PA-C  guaiFENesin-dextromethorphan (ROBITUSSIN DM) 100-10 MG/5ML syrup Take 5 mLs by mouth every 4 (four) hours as needed for cough. Patient not taking: Reported on 01/29/2018 12/24/17   Lew Dawes, PA-C    Family History Family History  Problem Relation Age of Onset  . Asthma Mother   . Heart disease Mother   . Hypertension Mother   . Stroke Mother   . Cancer Father        prostate  . Alcohol abuse Neg Hx   . Arthritis Neg Hx   . Birth defects Neg Hx   . COPD Neg Hx   . Depression Neg Hx   . Diabetes Neg Hx   . Drug abuse Neg Hx   . Early death Neg Hx   . Hearing loss Neg Hx   . Hyperlipidemia Neg Hx   . Kidney disease Neg Hx   . Learning disabilities Neg Hx   . Mental illness Neg Hx   . Mental retardation Neg Hx   . Miscarriages / Stillbirths Neg Hx   . Vision loss Neg Hx   . Varicose Veins Neg Hx     Social History Social History   Tobacco Use  . Smoking status: Never Smoker  . Smokeless tobacco: Never Used  Substance Use Topics  . Alcohol use: No  . Drug use: No     Allergies   Penicillins   Review of Systems Review of Systems  Constitutional: Negative for fever.  Respiratory: Negative for cough and shortness of breath.   Cardiovascular: Positive for chest pain. Negative for leg swelling.  Gastrointestinal: Negative for abdominal pain, nausea and vomiting.  Genitourinary: Negative for dysuria, hematuria and vaginal bleeding.  Neurological: Negative for headaches.  All other systems reviewed and are negative.    Physical Exam Updated Vital Signs BP 116/76   Pulse 80   Temp 99.5 F (37.5 C) (Oral)   Resp (!) 27   Ht 5\' 8"  (1.727 m)    Wt 112.5 kg   LMP 08/09/2017   SpO2 97%   BMI 37.71 kg/m   Physical Exam  Constitutional: She is oriented to person, place, and time. She appears well-developed and well-nourished.  HENT:  Head: Normocephalic and atraumatic.  Mouth/Throat: Oropharynx is clear and moist and mucous membranes are normal.  Eyes: Pupils are equal, round,  and reactive to light. Conjunctivae, EOM and lids are normal.  Neck: Full passive range of motion without pain.  Cardiovascular: Normal rate, regular rhythm, normal heart sounds and normal pulses. Exam reveals no gallop and no friction rub.  No murmur heard. Pulses:      Radial pulses are 2+ on the right side, and 2+ on the left side.       Dorsalis pedis pulses are 2+ on the right side, and 2+ on the left side.  Pulmonary/Chest: Effort normal and breath sounds normal.  Lungs clear to auscultation bilaterally.  Symmetric chest rise.  No wheezing, rales, rhonchi.  Pain is reproduced with palpation of the anterior chest wall.  It is also reproduced with movement of her left upper extremity.  Abdominal: Soft. There is no tenderness. There is no rigidity and no guarding.  Gravid abdomen. No tenderness noted.   Musculoskeletal: Normal range of motion.  Neurological: She is alert and oriented to person, place, and time.  Skin: Skin is warm and dry. Capillary refill takes less than 2 seconds.  Psychiatric: She has a normal mood and affect. Her speech is normal.  Nursing note and vitals reviewed.    ED Treatments / Results  Labs (all labs ordered are listed, but only abnormal results are displayed) Labs Reviewed  BASIC METABOLIC PANEL - Abnormal; Notable for the following components:      Result Value   CO2 21 (*)    Calcium 8.5 (*)    All other components within normal limits  CBC - Abnormal; Notable for the following components:   RBC 3.31 (*)    Hemoglobin 10.2 (*)    HCT 31.3 (*)    All other components within normal limits  I-STAT TROPONIN, ED      EKG None  Radiology Dg Chest 2 View  Result Date: 01/28/2018 CLINICAL DATA:  Sudden onset left anterior chest pain EXAM: CHEST - 2 VIEW COMPARISON:  12/01/2017 FINDINGS: Heart is borderline in size. No confluent airspace opacities or effusions. No acute bony abnormality. IMPRESSION: No active cardiopulmonary disease. Electronically Signed   By: Charlett Nose M.D.   On: 01/28/2018 23:09   Le Venous  Result Date: 01/29/2018  Lower Venous Study Indications: SOB, and chest pain. Other Indications: History of PE during previous pregnancy. Current with 6                    months pregnancy. Risk Factors: Current pregnancy. Limitations: Body habitus. Comparison Study: No comparison study available. Performing Technologist: Annamaria Helling  Examination Guidelines: A complete evaluation includes B-mode imaging, spectral Doppler, color Doppler, and power Doppler as needed of all accessible portions of each vessel. Bilateral testing is considered an integral part of a complete examination. Limited examinations for reoccurring indications may be performed as noted.  Right Venous Findings: +---------+---------------+---------+-----------+----------+-------------+          CompressibilityPhasicitySpontaneityPropertiesSummary       +---------+---------------+---------+-----------+----------+-------------+ CFV      Full           Yes      Yes                  sluggish flow +---------+---------------+---------+-----------+----------+-------------+ SFJ      Full                                                       +---------+---------------+---------+-----------+----------+-------------+  FV Prox  Full                                                       +---------+---------------+---------+-----------+----------+-------------+ FV Mid   Full                                                       +---------+---------------+---------+-----------+----------+-------------+ FV DistalFull                                                        +---------+---------------+---------+-----------+----------+-------------+ PFV      Full                                                       +---------+---------------+---------+-----------+----------+-------------+ POP      Full           Yes      Yes                                +---------+---------------+---------+-----------+----------+-------------+ PTV      Full                                                       +---------+---------------+---------+-----------+----------+-------------+ PERO     Full                                                       +---------+---------------+---------+-----------+----------+-------------+  Left Venous Findings: +---------+---------------+---------+-----------+----------+-------+          CompressibilityPhasicitySpontaneityPropertiesSummary +---------+---------------+---------+-----------+----------+-------+ CFV      Full           Yes      Yes                          +---------+---------------+---------+-----------+----------+-------+ SFJ      Full                                                 +---------+---------------+---------+-----------+----------+-------+ FV Prox  Full                                                 +---------+---------------+---------+-----------+----------+-------+ FV Mid   Full                                                 +---------+---------------+---------+-----------+----------+-------+  FV DistalFull                                                 +---------+---------------+---------+-----------+----------+-------+ PFV      Full                                                 +---------+---------------+---------+-----------+----------+-------+ POP      Full           Yes      Yes                          +---------+---------------+---------+-----------+----------+-------+ PTV      Full                                                  +---------+---------------+---------+-----------+----------+-------+ PERO     Full                                                 +---------+---------------+---------+-----------+----------+-------+    Summary: Right: There is no evidence of deep vein thrombosis in the lower extremity. However, portions of this examination were limited- see technologist comments above. No cystic structure found in the popliteal fossa. Left: There is no evidence of deep vein thrombosis in the lower extremity. However, portions of this examination were limited- see technologist comments above. No cystic structure found in the popliteal fossa.  *See table(s) above for measurements and observations.    Preliminary     Procedures Procedures (including critical care time)  Medications Ordered in ED Medications - No data to display   Initial Impression / Assessment and Plan / ED Course  I have reviewed the triage vital signs and the nursing notes.  Pertinent labs & imaging results that were available during my care of the patient were reviewed by me and considered in my medical decision making (see chart for details).     37 year old female past ministry of PE secondary to fetal demise who presents for evaluation of chest pain that began tonight at 9 PM while watching TV.  Was seen here yesterday for same symptoms.  She also had an outpatient ultrasound study of her bilateral lower extremities that revealed no evidence of DVT.  Patient reports that her pain is improved since being here in the ED.  She does report a history of PE and is currently on Lovenox daily.  She reports she has not missed any doses.  Patient is afebril, non-toxic appearing, sitting comfortably on examination table. Vital signs reviewed and stable.  Consider musculoskeletal pain given pain is reproduced with palpation and movement of her upper extremities.  Low suspicion for ACS etiology given  description of symptoms but also consideration.  Also question GERD as this happened later at night and with pregnancy, this could be increasing her symptoms.  Do not suspect PE as patient is not having any tachycardia or hypoxia here in ED and  she is currently on blood thinners.  Additionally, she had a DVT study of bilateral lower extremities that were negative for any PEs.   BMP shows bicarb of 21.  Otherwise unremarkable.  CBC shows no leukocytosis.  Hemoglobin is 10.2.  This is consistent with her baseline.  Troponin is negative.  EKG shows sinus rhythm, rate 99.  No other abnormalities.  At this time, do not feel that patient symptoms are result of ACS etiology.  She currently is not having any symptoms and states that she feels comfortable going home.  Additionally, do not suspect the patient symptoms are secondary to PE.  Patient has been in the ED without any signs of hypoxia.  She is currently not having difficulty breathing. Discussed patient with Dr. Eudelia Bunch who agrees with plan.  Instructed patient to follow-up with her OB/GYN as directed.  Instructed patient to continue taking her Lovenox doses as directed. At this time, patient exhibits no emergent life-threatening condition that require further evaluation in ED. Patient had ample opportunity for questions and discussion. All patient's questions were answered with full understanding. Strict return precautions discussed. Patient expresses understanding and agreement to plan.    Final Clinical Impressions(s) / ED Diagnoses   Final diagnoses:  Chest pain, unspecified type    ED Discharge Orders         Ordered    calcium carbonate (TUMS) 500 MG chewable tablet  Daily     01/30/18 0238           Maxwell Caul, PA-C 01/30/18 0554    Nira Conn, MD 01/30/18 [phone]

## 2018-01-30 NOTE — Discharge Instructions (Addendum)
You can take 1000 mg of Tylenol.  Do not exceed 4000 mg of Tylenol a day.  Please also try taking Tums when he has this pain.  Follow-up with your OB/GYN.  Return to the Emergency Department immediately if you experiencing worsening chest pain, difficulty breathing, nausea/vomiting, get very sweaty, headache or any other worsening or concerning symptoms.

## 2018-02-26 NOTE — L&D Delivery Note (Signed)
Delivery Note Pt progressed quickly to complete. She pushed twice and at 1:11 PM a viable female was delivered via Vaginal, Spontaneous (Presentation:LOA ;  ).  APGAR: 9, 9; weight pending .   Placenta status: delivered manually after 18 mins following delivery of baby; adherent, duncan; intact, .  Cord:3vc  with the following complications: none.  Cord pH: n/a  Anesthesia:  Epidural Episiotomy: None Lacerations: None Est. Blood Loss (mL): 300  Mom to postpartum.  Baby to Couplet care / Skin to Skin  Restart lovenox 6 hrs postpartum 1000mg  rectal cytotec placed now prophylactically.  [name] 04/25/2018, 1:51 PM

## 2018-03-12 ENCOUNTER — Emergency Department (HOSPITAL_COMMUNITY): Payer: 59

## 2018-03-12 ENCOUNTER — Emergency Department (HOSPITAL_COMMUNITY)
Admission: EM | Admit: 2018-03-12 | Discharge: 2018-03-12 | Disposition: A | Discharge status: Home / Self Care | Payer: 59 | Attending: Emergency Medicine | Admitting: Emergency Medicine

## 2018-03-12 ENCOUNTER — Encounter (HOSPITAL_COMMUNITY): Payer: Self-pay | Admitting: *Deleted

## 2018-03-12 ENCOUNTER — Emergency Department (HOSPITAL_BASED_OUTPATIENT_CLINIC_OR_DEPARTMENT_OTHER): Payer: 59

## 2018-03-12 DIAGNOSIS — R002 Palpitations: Secondary | ICD-10-CM | POA: Diagnosis not present

## 2018-03-12 DIAGNOSIS — R52 Pain, unspecified: Secondary | ICD-10-CM

## 2018-03-12 DIAGNOSIS — O133 Gestational [pregnancy-induced] hypertension without significant proteinuria, third trimester: Secondary | ICD-10-CM | POA: Insufficient documentation

## 2018-03-12 DIAGNOSIS — R600 Localized edema: Secondary | ICD-10-CM | POA: Insufficient documentation

## 2018-03-12 DIAGNOSIS — O1203 Gestational edema, third trimester: Secondary | ICD-10-CM | POA: Diagnosis not present

## 2018-03-12 DIAGNOSIS — O99413 Diseases of the circulatory system complicating pregnancy, third trimester: Secondary | ICD-10-CM | POA: Diagnosis not present

## 2018-03-12 DIAGNOSIS — Z79899 Other long term (current) drug therapy: Secondary | ICD-10-CM | POA: Insufficient documentation

## 2018-03-12 DIAGNOSIS — M7989 Other specified soft tissue disorders: Secondary | ICD-10-CM | POA: Diagnosis not present

## 2018-03-12 DIAGNOSIS — Z9049 Acquired absence of other specified parts of digestive tract: Secondary | ICD-10-CM | POA: Diagnosis not present

## 2018-03-12 DIAGNOSIS — Z3A00 Weeks of gestation of pregnancy not specified: Secondary | ICD-10-CM | POA: Insufficient documentation

## 2018-03-12 DIAGNOSIS — O9989 Other specified diseases and conditions complicating pregnancy, childbirth and the puerperium: Secondary | ICD-10-CM | POA: Diagnosis present

## 2018-03-12 LAB — CBC
HCT: 30.7 % — ABNORMAL LOW (ref 36.0–46.0)
Hemoglobin: 9.8 g/dL — ABNORMAL LOW (ref 12.0–15.0)
MCH: 30.7 pg (ref 26.0–34.0)
MCHC: 31.9 g/dL (ref 30.0–36.0)
MCV: 96.2 fL (ref 80.0–100.0)
Platelets: 219 K/uL (ref 150–400)
RBC: 3.19 MIL/uL — ABNORMAL LOW (ref 3.87–5.11)
RDW: 14 % (ref 11.5–15.5)
WBC: 7.8 K/uL (ref 4.0–10.5)
nRBC: 0 % (ref 0.0–0.2)

## 2018-03-12 LAB — I-STAT TROPONIN, ED
Troponin i, poc: 0 ng/mL (ref 0.00–0.08)
Troponin i, poc: 0 ng/mL (ref 0.00–0.08)

## 2018-03-12 LAB — BASIC METABOLIC PANEL
Anion gap: 8 (ref 5–15)
BUN: <5 mg/dL — ABNORMAL LOW (ref 6–20)
CALCIUM: 8.5 mg/dL — AB (ref 8.9–10.3)
CO2: 21 mmol/L — ABNORMAL LOW (ref 22–32)
CREATININE: 0.58 mg/dL (ref 0.44–1.00)
Chloride: 109 mmol/L (ref 98–111)
GFR calc Af Amer: >60 mL/min (ref >60)
GFR calc non Af Amer: >60 mL/min (ref >60)
Glucose, Bld: 95 mg/dL (ref 70–99)
Potassium: 3.6 mmol/L (ref 3.5–5.1)
Sodium: 138 mmol/L (ref 135–145)

## 2018-03-12 LAB — I-STAT BETA HCG BLOOD, ED (MC, WL, AP ONLY): I-stat hCG, quantitative: >2000 m[IU]/mL — ABNORMAL HIGH (ref <5)

## 2018-03-12 MED ORDER — SODIUM CHLORIDE 0.9% FLUSH: 3.0000 mL | Freq: Once | INTRAVENOUS | Status: DC

## 2018-03-12 NOTE — Progress Notes (Signed)
Bilateral lower extremities venous duplex exam completed. Please see preliminary notes on CV PROC under chart review. Kendrea Cerritos H Elray Dains(RDMS RVT) 03/12/18 7:00 PM

## 2018-03-12 NOTE — ED Provider Notes (Signed)
[facility] EMERGENCY DEPARTMENT Provider Note   CSN: [id number] Arrival date & time: 03/12/18  1455     History   Chief Complaint Chief Complaint  Patient presents with  . Chest Pain    HPI Mindy Wade is a 38 y.o. female.  Mindy Wade [id number] currently 62mo pregnant w/ h/o PE on lovenox, gestational HTN, HLD who p/w heart fluttering.  Patient states that after waking this morning, she has been having intermittent episodes of heart fluttering sensation not associated with exertion.  No associated chest pain.  She reports that she has had some heart fluttering previously but it has been brief, this has been more frequent.  She has occasional shortness of breath but relates to this to being pregnant and getting bigger. No specific SOB related to today's complaint. She denies leg swelling/pain, recent travel. She is compliant with lovenox. She reports getting scratchy throat and mild cough yesterday, no fevers, body aches, vomiting, or diarrhea. No sick contacts. She has OB appt tomorrow.  No vaginal bleeding or abd pain. Good fetal movement.   Chest Pain    Past Medical History:  Diagnosis Date  . Breast feeding status of mother   . Hx of blood clots   . Hx of varicella   . Hyperlipemia   . Hypertension   . Pregnancy induced hypertension   . Pulmonary embolism [facility])     Patient Active Problem List   Diagnosis Date Noted  . Missed abortion with fetal demise before 20 completed weeks of gestation 05/03/2017  . Precipitous delivery 02/19/2015  . Postpartum care following vaginal delivery 02/19/2015  . Active labor at term 02/18/2015  . Pre-diabetes 05/14/2014  . Elevated LDL cholesterol level 05/14/2014  . Pulmonary embolism (HCC) 10/17/2013  . Labor and delivery indication for care or intervention 10/09/2013  . Spontaneous abortion in second trimester 10/09/2013  . SVD (spontaneous vaginal delivery) 03/02/2013    Past Surgical History:  Procedure  Laterality Date  . CHOLECYSTECTOMY       OB History    Gravida  9   Para  7   Term  6   Preterm  1   AB  1   Living  6     SAB  1   TAB      Ectopic      Multiple  0   Live Births  7            Home Medications    Prior to Admission medications   Medication Sig Start Date End Date Taking? Authorizing Provider  enoxaparin (LOVENOX) 40 MG/0.4ML injection Inject 40 mg into the skin daily.   Yes [provider]  IRON PO Take 1 tablet by mouth daily.   Yes [provider]  labetalol (NORMODYNE) 200 MG tablet Take 200 mg by mouth 2 (two) times daily. Reported on 05/02/2015   Yes [provider]  Prenat-FePoly-Metf-FA-DHA-DSS (VITAFOL FE+) 90-1-200 & 50 MG CPPK Take 1 capsule by mouth daily.  01/13/18  Yes [provider]  famotidine (PEPCID) 20 MG tablet Take 1 tablet (20 mg total) by mouth 2 (two) times daily. Patient not taking: Reported on 12/25/2017 12/01/17   Gerhard Munch, MD  guaiFENesin-dextromethorphan [facility] DM) 100-10 MG/5ML syrup Take 5 mLs by mouth every 4 (four) hours as needed for cough. Patient not taking: Reported on 01/29/2018 12/24/17   Lew Dawes, PA-C    Family History Family History  Problem Relation Age of Onset  .  Asthma Mother   . Heart disease Mother   . Hypertension Mother   . Stroke Mother   . Cancer Father        prostate  . Alcohol abuse Neg Hx   . Arthritis Neg Hx   . Birth defects Neg Hx   . COPD Neg Hx   . Depression Neg Hx   . Diabetes Neg Hx   . Drug abuse Neg Hx   . Early death Neg Hx   . Hearing loss Neg Hx   . Hyperlipidemia Neg Hx   . Kidney disease Neg Hx   . Learning disabilities Neg Hx   . Mental illness Neg Hx   . Mental retardation Neg Hx   . Miscarriages / Stillbirths Neg Hx   . Vision loss Neg Hx   . Varicose Veins Neg Hx     Social History Social History   Tobacco Use  . Smoking status: Never Smoker  . Smokeless tobacco: Never Used  Substance Use  Topics  . Alcohol use: No  . Drug use: No     Allergies   Penicillins   Review of Systems Review of Systems  Cardiovascular: Positive for chest pain.   All other systems reviewed and are negative except that which was mentioned in HPI   Physical Exam Updated Vital Signs BP 109/67   Pulse 71   Temp 98.5 F (36.9 C) (Oral)   Resp (!) 21   Ht 5\' 6"  (1.676 m)   LMP 08/09/2017   SpO2 99%   BMI 40.03 kg/m   Physical Exam Vitals signs and nursing note reviewed.  Constitutional:      General: She is not in acute distress.    Appearance: She is well-developed.  HENT:     Head: Normocephalic and atraumatic.     Mouth/Throat:     Mouth: Mucous membranes are moist.     Pharynx: Oropharynx is clear. No oropharyngeal exudate or posterior oropharyngeal erythema.  Eyes:     Conjunctiva/sclera: Conjunctivae normal.  Neck:     Musculoskeletal: Neck supple.  Cardiovascular:     Rate and Rhythm: Normal rate and regular rhythm.     Heart sounds: Normal heart sounds. No murmur.  Pulmonary:     Effort: Pulmonary effort is normal.     Breath sounds: Normal breath sounds.  Abdominal:     General: Bowel sounds are normal. There is no distension.     Palpations: Abdomen is soft.     Tenderness: There is no abdominal tenderness.     Comments: Gravid uterus  Musculoskeletal:     Right lower leg: She exhibits no tenderness.     Left lower leg: She exhibits no tenderness.     Comments: Trace BLE edema  Skin:    General: Skin is warm and dry.  Neurological:     Mental Status: She is alert and oriented to person, place, and time.     Comments: Fluent speech  Psychiatric:        Judgment: Judgment normal.      ED Treatments / Results  Labs (all labs ordered are listed, but only abnormal results are displayed) Labs Reviewed  BASIC METABOLIC PANEL - Abnormal; Notable for the following components:      Result Value   CO2 21 (*)    BUN <5 (*)    Calcium 8.5 (*)    All other  components within normal limits  CBC - Abnormal; Notable for the following components:  RBC 3.19 (*)    Hemoglobin 9.8 (*)    HCT 30.7 (*)    All other components within normal limits  I-STAT BETA HCG BLOOD, ED ([facility], [facility], [facility] ONLY) - Abnormal; Notable for the following components:   I-stat hCG, quantitative >2,000.0 (*)    All other components within normal limits  I-STAT TROPONIN, ED  I-STAT TROPONIN, ED    EKG EKG Interpretation  Date/Time:  Wednesday March 12 2018 15:04:56 EST Ventricular Rate:  93 PR Interval:  144 QRS Duration: 80 QT Interval:  336 QTC Calculation: 417 R Axis:   80 Text Interpretation:  Normal sinus rhythm Possible Anterior infarct , age undetermined Abnormal ECG similar to previous Confirmed by Frederick PeersLittle, Marcel Gary [phone]) on 03/12/2018 6:08:02 PM   Radiology Dg Chest 2 View  Result Date: 03/12/2018 CLINICAL DATA:  Chest pain and shortness of breath. EXAM: CHEST - 2 VIEW COMPARISON:  01/28/2018 FINDINGS: The heart size and pulmonary vascularity are normal. Lungs are clear. No effusions. No bone abnormality. IMPRESSION: No active cardiopulmonary disease. Electronically Signed   By: Francene BoyersJames  Maxwell M.D.   On: 03/12/2018 16:04   Vas [place] Lower Extremity Venous (dvt) ([facility] And [facility] 7a-7p)  Result Date: 03/12/2018  Lower Venous Study Indications: Pain, Swelling, and History of PE during previous pregnancy. Other Indications: Current 7 months pregnancy. Limitations: Body habitus. Comparison Study: Bilateral lower extremities venous duplex exam on 01/29/2018 Performing Technologist: Melodie BouillonSelina Cole  Examination Guidelines: A complete evaluation includes B-mode imaging, spectral Doppler, color Doppler, and power Doppler as needed of all accessible portions of each vessel. Bilateral testing is considered an integral part of a complete examination. Limited examinations for reoccurring indications may be performed as noted.  Right Venous Findings:  +---------+---------------+---------+-----------+----------+------------------+          CompressibilityPhasicitySpontaneityPropertiesSummary            +---------+---------------+---------+-----------+----------+------------------+ CFV      Full           Yes      Yes                                     +---------+---------------+---------+-----------+----------+------------------+ SFJ      Full                                                            +---------+---------------+---------+-----------+----------+------------------+ FV Prox  Full                                                            +---------+---------------+---------+-----------+----------+------------------+ FV Mid   Full                                                            +---------+---------------+---------+-----------+----------+------------------+ FV DistalFull                                                            +---------+---------------+---------+-----------+----------+------------------+  PFV      Full                                                            +---------+---------------+---------+-----------+----------+------------------+ POP      Full                    Yes                  very sluggish flow +---------+---------------+---------+-----------+----------+------------------+ PTV      Full                                                            +---------+---------------+---------+-----------+----------+------------------+ PERO     Full                                                            +---------+---------------+---------+-----------+----------+------------------+  Left Venous Findings: +---------+---------------+---------+-----------+----------+------------------+          CompressibilityPhasicitySpontaneityPropertiesSummary            +---------+---------------+---------+-----------+----------+------------------+ CFV       Full           Yes      Yes                                     +---------+---------------+---------+-----------+----------+------------------+ SFJ      Full                                                            +---------+---------------+---------+-----------+----------+------------------+ FV Prox  Full                                                            +---------+---------------+---------+-----------+----------+------------------+ FV Mid   Full                                                            +---------+---------------+---------+-----------+----------+------------------+ FV DistalFull                                                            +---------+---------------+---------+-----------+----------+------------------+ PFV  Full                                                            +---------+---------------+---------+-----------+----------+------------------+ POP      Full           Yes      Yes                  very sluggish flow +---------+---------------+---------+-----------+----------+------------------+ PTV      Full                                                            +---------+---------------+---------+-----------+----------+------------------+ PERO     Full                                                            +---------+---------------+---------+-----------+----------+------------------+    Summary: Right: There is no evidence of deep vein thrombosis in the lower extremity. No cystic structure found in the popliteal fossa. Left: There is no evidence of deep vein thrombosis in the lower extremity. No cystic structure found in the popliteal fossa.  *See table(s) above for measurements and observations. Electronically signed by Gretta Began MD on 03/12/2018 at 8:01:45 PM.    Final     Procedures Procedures (including critical care time)  Medications Ordered in ED Medications  sodium chloride  flush (NS) 0.9 % injection 3 mL (3 mLs Intravenous Not Given 03/12/18 1859)     Initial Impression / Assessment and Plan / ED Course  I have reviewed the triage vital signs and the nursing notes.  Pertinent labs & imaging results that were available during my care of the patient were reviewed by me and considered in my medical decision making (see chart for details).    Well-appearing and pleasant on exam, normal vital signs.  O2 saturation 100% on room air.  No tachypnea or tachycardia.  Denies chest pain or shortness of breath related to the heart fluttering.  Denies missing any doses of Lovenox. Trops negative, CXR normal. Basic labs reassuring. Obtained US b/l LE which were normal.   Given normal vital signs and no concerning features, as well as her strict compliance with anticoagulation, I feel PE is extremely unlikely.  Her symptoms are not suggestive of ACS.  Have discussed return precautions and patient will follow-up with her OB/GYN tomorrow.  Final Clinical Impressions(s) / ED Diagnoses   Final diagnoses:  Fluttering sensation of heart    ED Discharge Orders    None       Loreena Valeri, Ambrose Finland, MD 03/12/18 2018

## 2018-03-12 NOTE — ED Triage Notes (Signed)
Pt in c/o SOB with sensation intermittently of heart racing onset this am, pt hx of blood clots, pt 7 mths pregnant, pt denies n/v/d, A&O x4

## 2018-03-21 ENCOUNTER — Ambulatory Visit (HOSPITAL_COMMUNITY)
Admission: RE | Admit: 2018-03-21 | Discharge: 2018-03-21 | Disposition: A | Discharge status: Home / Self Care | Payer: 59 | Source: Ambulatory Visit | Attending: Obstetrics and Gynecology | Admitting: Obstetrics and Gynecology

## 2018-03-21 ENCOUNTER — Other Ambulatory Visit (HOSPITAL_COMMUNITY): Payer: Self-pay | Admitting: Obstetrics and Gynecology

## 2018-03-21 DIAGNOSIS — O09523 Supervision of elderly multigravida, third trimester: Secondary | ICD-10-CM

## 2018-03-21 DIAGNOSIS — O288 Other abnormal findings on antenatal screening of mother: Secondary | ICD-10-CM | POA: Insufficient documentation

## 2018-03-21 DIAGNOSIS — O10013 Pre-existing essential hypertension complicating pregnancy, third trimester: Secondary | ICD-10-CM | POA: Diagnosis not present

## 2018-03-21 DIAGNOSIS — O2693 Pregnancy related conditions, unspecified, third trimester: Secondary | ICD-10-CM | POA: Diagnosis not present

## 2018-03-21 DIAGNOSIS — O289 Unspecified abnormal findings on antenatal screening of mother: Secondary | ICD-10-CM | POA: Diagnosis not present

## 2018-03-21 DIAGNOSIS — Z3A33 33 weeks gestation of pregnancy: Secondary | ICD-10-CM

## 2018-03-27 ENCOUNTER — Emergency Department (HOSPITAL_COMMUNITY): Payer: 59

## 2018-03-27 ENCOUNTER — Encounter (HOSPITAL_COMMUNITY): Payer: Self-pay

## 2018-03-27 ENCOUNTER — Other Ambulatory Visit: Payer: Self-pay

## 2018-03-27 ENCOUNTER — Emergency Department (HOSPITAL_COMMUNITY)
Admission: EM | Admit: 2018-03-27 | Discharge: 2018-03-27 | Disposition: A | Discharge status: Home / Self Care | Payer: 59 | Attending: Emergency Medicine | Admitting: Emergency Medicine

## 2018-03-27 DIAGNOSIS — Z7901 Long term (current) use of anticoagulants: Secondary | ICD-10-CM | POA: Insufficient documentation

## 2018-03-27 DIAGNOSIS — Z79899 Other long term (current) drug therapy: Secondary | ICD-10-CM | POA: Insufficient documentation

## 2018-03-27 DIAGNOSIS — R0789 Other chest pain: Secondary | ICD-10-CM | POA: Diagnosis not present

## 2018-03-27 DIAGNOSIS — I10 Essential (primary) hypertension: Secondary | ICD-10-CM | POA: Insufficient documentation

## 2018-03-27 DIAGNOSIS — R079 Chest pain, unspecified: Secondary | ICD-10-CM | POA: Diagnosis present

## 2018-03-27 LAB — BASIC METABOLIC PANEL
Anion gap: 9 (ref 5–15)
BUN: 8 mg/dL (ref 6–20)
CO2: 19 mmol/L — ABNORMAL LOW (ref 22–32)
Calcium: 8.7 mg/dL — ABNORMAL LOW (ref 8.9–10.3)
Chloride: 107 mmol/L (ref 98–111)
Creatinine, Ser: 0.5 mg/dL (ref 0.44–1.00)
GFR calc Af Amer: >60 mL/min (ref >60)
GFR calc non Af Amer: >60 mL/min (ref >60)
Glucose, Bld: 81 mg/dL (ref 70–99)
Potassium: 3.8 mmol/L (ref 3.5–5.1)
Sodium: 135 mmol/L (ref 135–145)

## 2018-03-27 LAB — CBC
HCT: 31.7 % — ABNORMAL LOW (ref 36.0–46.0)
Hemoglobin: 10.2 g/dL — ABNORMAL LOW (ref 12.0–15.0)
MCH: 31.5 pg (ref 26.0–34.0)
MCHC: 32.2 g/dL (ref 30.0–36.0)
MCV: 97.8 fL (ref 80.0–100.0)
PLATELETS: 208 10*3/uL (ref 150–400)
RBC: 3.24 MIL/uL — ABNORMAL LOW (ref 3.87–5.11)
RDW: 14.4 % (ref 11.5–15.5)
WBC: 7.3 10*3/uL (ref 4.0–10.5)
nRBC: 0 % (ref 0.0–0.2)

## 2018-03-27 LAB — POCT I-STAT TROPONIN I
Troponin i, poc: 0 ng/mL (ref 0.00–0.08)
Troponin i, poc: 0 ng/mL (ref 0.00–0.08)

## 2018-03-27 MED ORDER — ACETAMINOPHEN 325 MG PO TABS
650.0000 mg | ORAL_TABLET | Freq: Once | ORAL | Status: AC
  Administered 2018-03-27: 650 mg via ORAL
  Filled 2018-03-27: qty 2

## 2018-03-27 MED ORDER — SODIUM CHLORIDE 0.9% FLUSH: 3.0000 mL | Freq: Once | INTRAVENOUS | Status: DC

## 2018-03-27 NOTE — ED Provider Notes (Addendum)
[facility]-EMERGENCY DEPT Provider Note   CSN: [id number] Arrival date & time: 03/27/18  1832     History   Chief Complaint Chief Complaint  Patient presents with  . Chest Pain    HPI Mindy Wade is a 38 y.o. female.  HPI  38 year old female who is approximately 8 months pregnant and G9 presents with chest pain.  Has been having this pain on and off, every day, for about 3 weeks.  Seems stronger tonight a few hours ago.  There is no associated shortness of breath.  No vomiting, dizziness or diaphoresis.  Has not take anything for the pain.  The patient states that it was stronger tonight but otherwise felt just like the pain for the past 3 weeks.  No leg swelling.  She is had a blood clot before but states that this feels nowhere close to that.  She is currently on Lovenox.  She has not noticed any abdominal pain, vaginal bleeding or discharge, or decreased fetal movement.  Past Medical History:  Diagnosis Date  . Breast feeding status of mother   . Hx of blood clots   . Hx of varicella   . Hyperlipemia   . Hypertension   . Pregnancy induced hypertension   . Pulmonary embolism [facility])     Patient Active Problem List   Diagnosis Date Noted  . Missed abortion with fetal demise before 20 completed weeks of gestation 05/03/2017  . Precipitous delivery 02/19/2015  . Postpartum care following vaginal delivery 02/19/2015  . Active labor at term 02/18/2015  . Pre-diabetes 05/14/2014  . Elevated LDL cholesterol level 05/14/2014  . Pulmonary embolism (HCC) 10/17/2013  . Labor and delivery indication for care or intervention 10/09/2013  . Spontaneous abortion in second trimester 10/09/2013  . SVD (spontaneous vaginal delivery) 03/02/2013    Past Surgical History:  Procedure Laterality Date  . CHOLECYSTECTOMY       OB History    Gravida  9   Para  7   Term  6   Preterm  1   AB  1   Living  6     SAB  1   TAB      Ectopic      Multiple  0   Live Births  7            Home Medications    Prior to Admission medications   Medication Sig Start Date End Date Taking? Authorizing Provider  enoxaparin (LOVENOX) 40 MG/0.4ML injection Inject 40 mg into the skin daily.   Yes [provider]  IRON PO Take 1 tablet by mouth every evening.    Yes [provider]  labetalol (NORMODYNE) 200 MG tablet Take 200 mg by mouth 2 (two) times daily. Reported on 05/02/2015   Yes [provider]  Prenat-FePoly-Metf-FA-DHA-DSS (VITAFOL FE+) 90-1-200 & 50 MG CPPK Take 1 capsule by mouth daily.  01/13/18  Yes [provider]  famotidine (PEPCID) 20 MG tablet Take 1 tablet (20 mg total) by mouth 2 (two) times daily. Patient not taking: Reported on 12/25/2017 12/01/17   Gerhard Munch, MD  guaiFENesin-dextromethorphan [facility] DM) 100-10 MG/5ML syrup Take 5 mLs by mouth every 4 (four) hours as needed for cough. Patient not taking: Reported on 01/29/2018 12/24/17   Lew Dawes, PA-C    Family History Family History  Problem Relation Age of Onset  . Asthma Mother   . Heart disease Mother   . Hypertension Mother   .  Stroke Mother   . Cancer Father        prostate  . Alcohol abuse Neg Hx   . Arthritis Neg Hx   . Birth defects Neg Hx   . COPD Neg Hx   . Depression Neg Hx   . Diabetes Neg Hx   . Drug abuse Neg Hx   . Early death Neg Hx   . Hearing loss Neg Hx   . Hyperlipidemia Neg Hx   . Kidney disease Neg Hx   . Learning disabilities Neg Hx   . Mental illness Neg Hx   . Mental retardation Neg Hx   . Miscarriages / Stillbirths Neg Hx   . Vision loss Neg Hx   . Varicose Veins Neg Hx     Social History Social History   Tobacco Use  . Smoking status: Never Smoker  . Smokeless tobacco: Never Used  Substance Use Topics  . Alcohol use: No  . Drug use: No     Allergies   Penicillins   Review of Systems Review of Systems  Constitutional: Negative for fever.  Respiratory:  Negative for cough and shortness of breath.   Cardiovascular: Positive for chest pain. Negative for leg swelling.  Gastrointestinal: Negative for abdominal pain and vomiting.  All other systems reviewed and are negative.    Physical Exam Updated Vital Signs BP 120/89   Pulse 76   Temp 98.2 F (36.8 C) (Oral)   Resp (!) 21   Ht 5\' 6"  (1.676 m)   Wt 117.9 kg   LMP 08/09/2017   SpO2 99%   BMI 41.97 kg/m   Physical Exam Vitals signs and nursing note reviewed.  Constitutional:      Appearance: She is well-developed.  HENT:     Head: Normocephalic and atraumatic.     Right Ear: External ear normal.     Left Ear: External ear normal.     Nose: Nose normal.  Eyes:     General:        Right eye: No discharge.        Left eye: No discharge.  Cardiovascular:     Rate and Rhythm: Normal rate and regular rhythm.     Heart sounds: Normal heart sounds.  Pulmonary:     Effort: Pulmonary effort is normal.     Breath sounds: Normal breath sounds.  Chest:     Chest wall: Tenderness present.    Abdominal:     Palpations: Abdomen is soft.     Tenderness: There is no abdominal tenderness.     Comments: Gravid  Musculoskeletal:     Right lower leg: Edema present.     Left lower leg: Edema present.     Comments: Trace edema  Skin:    General: Skin is warm and dry.  Neurological:     Mental Status: She is alert.  Psychiatric:        Mood and Affect: Mood is not anxious.      ED Treatments / Results  Labs (all labs ordered are listed, but only abnormal results are displayed) Labs Reviewed  BASIC METABOLIC PANEL - Abnormal; Notable for the following components:      Result Value   CO2 19 (*)    Calcium 8.7 (*)    All other components within normal limits  CBC - Abnormal; Notable for the following components:   RBC 3.24 (*)    Hemoglobin 10.2 (*)    HCT 31.7 (*)    All  other components within normal limits  I-STAT TROPONIN, ED  POCT I-STAT TROPONIN I  I-STAT  TROPONIN, ED  POCT I-STAT TROPONIN I    EKG EKG Interpretation  Date/Time:  Thursday March 27 2018 18:41:26 EST Ventricular Rate:  84 PR Interval:    QRS Duration: 88 QT Interval:  375 QTC Calculation: 444 R Axis:   58 Text Interpretation:  Sinus rhythm Baseline wander in lead(s) III V2 V3 V4 no acute ST/T changes no significant change since Mar 12 2018 Confirmed by Pricilla Loveless [phone]) on 03/27/2018 7:21:01 PM   EKG Interpretation  Date/Time:  Thursday March 27 2018 21:55:59 EST Ventricular Rate:  74 PR Interval:    QRS Duration: 98 QT Interval:  375 QTC Calculation: 416 R Axis:   71 Text Interpretation:  Sinus rhythm no acute ST/T changes no significant change since earlier in the day Confirmed by Pricilla Loveless [phone]) on 03/27/2018 10:28:00 PM        Radiology Dg Chest 2 View  Result Date: 03/27/2018 CLINICAL DATA:  Acute chest pain today. EXAM: CHEST - 2 VIEW COMPARISON:  03/12/2018 a prior exams FINDINGS: Mild cardiomegaly and mild peribronchial thickening again noted. Mild elevation of the RIGHT hemidiaphragm again noted. There is no evidence of focal airspace disease, pulmonary edema, suspicious pulmonary nodule/mass, pleural effusion, or pneumothorax. No acute bony abnormalities are identified. IMPRESSION: Mild cardiomegaly without evidence of acute cardiopulmonary disease. Electronically Signed   By: Harmon Pier M.D.   On: 03/27/2018 19:10    Procedures Procedures (including critical care time)  Medications Ordered in ED Medications  sodium chloride flush (NS) 0.9 % injection 3 mL (has no administration in time range)  acetaminophen (TYLENOL) tablet 650 mg (650 mg Oral Given 03/27/18 1958)     Initial Impression / Assessment and Plan / ED Course  I have reviewed the triage vital signs and the nursing notes.  Pertinent labs & imaging results that were available during my care of the patient were reviewed by me and considered in my medical decision making  (see chart for details).     Patient's chest pain is pretty atypical.  While she has had PE in the past, she is currently on Lovenox and has been compliant.  This is nothing like her previous PE.  She is not hypoxic and she is currently not tachycardic.  Given the very low suspicion, I think is reasonable to not work this up in the emergency department.  Troponins are negative x2 with benign ECGs.  I think this is likely chest wall in etiology.  She appears stable for discharge home and she has a cardiologist she can follow-up with.  No pregnancy complaints.  Final Clinical Impressions(s) / ED Diagnoses   Final diagnoses:  Chest wall pain    ED Discharge Orders    None       Pricilla Loveless, MD 03/27/18 8850    Pricilla Loveless, MD 03/27/18 [phone]

## 2018-03-27 NOTE — Discharge Instructions (Addendum)
If you develop recurrent, continued, or worsening chest pain, shortness of breath, fever, vomiting, abdominal or back pain, or any other new/concerning symptoms then return to the ER for evaluation.  

## 2018-03-27 NOTE — ED Triage Notes (Signed)
Patient states she was at home sitting and began having left chest pain x 10-15 minutes. Patient states she has had intermittent chest pain x 1 week. Patient states she saw her cardiologist today and had an EKG, Troponin.  patient is 8 months pregnant and is due 05/14/18.

## 2018-04-23 ENCOUNTER — Inpatient Hospital Stay (HOSPITAL_COMMUNITY)
Admission: AD | Admit: 2018-04-23 | Discharge: 2018-04-23 | Disposition: A | Discharge status: Home / Self Care | Payer: 59 | Source: Home / Self Care | Attending: Obstetrics and Gynecology | Admitting: Obstetrics and Gynecology

## 2018-04-23 ENCOUNTER — Other Ambulatory Visit: Payer: Self-pay

## 2018-04-23 ENCOUNTER — Encounter (HOSPITAL_COMMUNITY): Payer: Self-pay

## 2018-04-23 DIAGNOSIS — O479 False labor, unspecified: Secondary | ICD-10-CM

## 2018-04-23 NOTE — Discharge Instructions (Signed)
Braxton Hicks Contractions Contractions of the uterus can occur throughout pregnancy, but they are not always a sign that you are in labor. You may have practice contractions called Braxton Hicks contractions. These false labor contractions are sometimes confused with true labor. What are Braxton Hicks contractions? Braxton Hicks contractions are tightening movements that occur in the muscles of the uterus before labor. Unlike true labor contractions, these contractions do not result in opening (dilation) and thinning of the cervix. Toward the end of pregnancy (32-34 weeks), Braxton Hicks contractions can happen more often and may become stronger. These contractions are sometimes difficult to tell apart from true labor because they can be very uncomfortable. You should not feel embarrassed if you go to the hospital with false labor. Sometimes, the only way to tell if you are in true labor is for your health care provider to look for changes in the cervix. The health care provider will do a physical exam and may monitor your contractions. If you are not in true labor, the exam should show that your cervix is not dilating and your water has not broken. If there are no other health problems associated with your pregnancy, it is completely safe for you to be sent home with false labor. You may continue to have Braxton Hicks contractions until you go into true labor. How to tell the difference between true labor and false labor True labor  Contractions last 30-70 seconds.  Contractions become very regular.  Discomfort is usually felt in the top of the uterus, and it spreads to the lower abdomen and low back.  Contractions do not go away with walking.  Contractions usually become more intense and increase in frequency.  The cervix dilates and gets thinner. False labor  Contractions are usually shorter and not as strong as true labor contractions.  Contractions are usually irregular.  Contractions  are often felt in the front of the lower abdomen and in the groin.  Contractions may go away when you walk around or change positions while lying down.  Contractions get weaker and are shorter-lasting as time goes on.  The cervix usually does not dilate or become thin. Follow these instructions at home:   Take over-the-counter and prescription medicines only as told by your health care provider.  Keep up with your usual exercises and follow other instructions from your health care provider.  Eat and drink lightly if you think you are going into labor.  If Braxton Hicks contractions are making you uncomfortable: ? Change your position from lying down or resting to walking, or change from walking to resting. ? Sit and rest in a tub of warm water. ? Drink enough fluid to keep your urine pale yellow. Dehydration may cause these contractions. ? Do slow and deep breathing several times an hour.  Keep all follow-up prenatal visits as told by your health care provider. This is important. Contact a health care provider if:  You have a fever.  You have continuous pain in your abdomen. Get help right away if:  Your contractions become stronger, more regular, and closer together.  You have fluid leaking or gushing from your vagina.  You pass blood-tinged mucus (bloody show).  You have bleeding from your vagina.  You have low back pain that you never had before.  You feel your baby's head pushing down and causing pelvic pressure.  Your baby is not moving inside you as much as it used to. Summary  Contractions that occur before labor are   called Braxton Hicks contractions, false labor, or practice contractions.  Braxton Hicks contractions are usually shorter, weaker, farther apart, and less regular than true labor contractions. True labor contractions usually become progressively stronger and regular, and they become more frequent.  Manage discomfort from Braxton Hicks contractions  by changing position, resting in a warm bath, drinking plenty of water, or practicing deep breathing. This information is not intended to replace advice given to you by your health care provider. Make sure you discuss any questions you have with your health care provider. Document Released: 06/28/2016 Document Revised: 11/27/2016 Document Reviewed: 06/28/2016 Elsevier Interactive Patient Education  2019 [employer]  

## 2018-04-23 NOTE — MAU Note (Signed)
Pt states that she started having ctx's a little over an hour ago.    She states they were 3 minutes, but now she stated they are much further apart.   Reports +FM   Denies vaginal bleeding, or LOF.

## 2018-04-24 ENCOUNTER — Telehealth (HOSPITAL_COMMUNITY): Payer: Self-pay | Admitting: *Deleted

## 2018-04-24 ENCOUNTER — Encounter (HOSPITAL_COMMUNITY): Payer: Self-pay | Admitting: *Deleted

## 2018-04-24 NOTE — Telephone Encounter (Signed)
Preadmission screen  

## 2018-04-24 NOTE — H&P (Signed)
Mindy Wade is a 7 [id number] female presenting for at 50 0/[redacted]wks gestation for iol due to chronic hypertension, term pregnancy. Pt is AMA and was late to care ( 19 weeks)- dated per [place]. She has a history of PE - was on lovenox through pregnancy till 04/23/18. She is chronic hypertensive and elevation in BP was noted this week - on labetalol 200mg  po bid. She ws monitored with weekly BPP due to polyhydramnios. She also had proteinuria early in pregnancy - saw nephro; improved. 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 Panorama low risk OB History    Gravida  9   Para  7   Term  6   Preterm  1   AB  1   Living  6     SAB  1   TAB      Ectopic      Multiple  0   Live Births  7          Past Medical History:  Diagnosis Date  . AMA (advanced maternal age) multigravida 35+   . Breast feeding status of mother   . GERD (gastroesophageal reflux disease)   . Headache   . Hx of blood clots   . Hx of varicella   . Hyperlipemia   . Hypertension   . Pregnancy induced hypertension   . Pulmonary embolism Mindy Wade)    Past Surgical History:  Procedure Laterality Date  . CHOLECYSTECTOMY     Family History: family history includes Asthma in her mother; Cancer in her father; Heart disease in her mother; Hypertension in her mother; Stroke in her mother. Social History:  reports that she has never smoked. She has never used smokeless tobacco. She reports that she does not drink alcohol or use drugs.     Maternal Diabetes: No Genetic Screening: Normal Maternal Ultrasounds/Referrals: Abnormal:  Findings:   Other:polyhydramnios Fetal Ultrasounds or other Referrals:  None Maternal Substance Abuse:  No Significant Maternal Medications:  Meds include: Protonix Other: labetalol 200mg  po bid Significant Maternal Lab Results:  Lab values include: Group B Strep positive Other Comments:  None  Review of Systems   Constitutional: Positive for malaise/fatigue. Negative for chills and fever.  Eyes: Negative for blurred vision.  Respiratory: Negative for shortness of breath.   Cardiovascular: Positive for leg swelling. Negative for chest pain.  Gastrointestinal: Positive for abdominal pain. Negative for heartburn, nausea and vomiting.  Genitourinary: Negative for dysuria.  Musculoskeletal: Positive for myalgias.  Skin: Negative for itching and rash.  Neurological: Negative for dizziness and headaches.  Endo/Heme/Allergies: Does not bruise/bleed easily.  Psychiatric/Behavioral: Negative for depression, hallucinations, substance abuse and suicidal ideas. The patient is not nervous/anxious.    Maternal Medical History:  Reason for admission: Nausea. Chronic hypertension, term pregnancy  Contractions: Onset was 1 week or more ago.   Frequency: irregular.   Perceived severity is mild.    Fetal activity: Perceived fetal activity is normal.   Last perceived fetal movement was within the past hour.    Prenatal complications: PIH and polyhydramnios.   Hx PE, headache disorder, late to car, AMA  Prenatal Complications - Diabetes: none.      Last menstrual period 08/09/2017, unknown if currently breastfeeding. Maternal Exam:  Uterine Assessment: Contraction strength is mild.  Contraction frequency is irregular and rare.   Abdomen: Patient reports generalized tenderness.  Estimated fetal weight is AGA.   Fetal presentation: vertex  Introitus: Normal vulva.  Vulva is negative for condylomata and lesion.  Normal vagina.  Vagina is negative for condylomata.  Pelvis: adequate for delivery.   Cervix: Cervix evaluated by digital exam.     Physical Exam  Constitutional: She is oriented to person, place, and time. She appears well-developed and well-nourished.  Neck: Normal range of motion.  Cardiovascular: Normal rate.  Respiratory: Effort normal.  GI: Soft. There is generalized abdominal  tenderness.  Genitourinary:    Vulva, vagina and uterus normal.     No vulval condylomata or lesion noted.   Musculoskeletal: Normal range of motion.        General: Edema present.  Neurological: She is alert and oriented to person, place, and time.  Skin: Skin is warm.  Psychiatric: She has a normal mood and affect. Judgment and thought content normal.    Prenatal labs: ABO, Rh: A/Positive/-- (09/03 0000) Antibody: Positive (09/03 0000) Rubella: Immune (09/03 0000) RPR: Nonreactive (09/03 0000)  HBsAg: Negative (09/03 0000)  HIV: Non-reactive (09/03 0000)  GBS:     Assessment/Plan: 73 presenting at 34 0/[redacted]wks gestation for iol due to Chronic hypertension, term pregnancy GBS pos - PCN allergy; clinda insensitivity - use vanc Pain control prn Pitocin then AROM Monitor BP Stopped lovenox 04/23/18 Anticipate svd   Mindy Wade 04/24/2018, 5:15 PM

## 2018-04-25 ENCOUNTER — Other Ambulatory Visit: Payer: Self-pay

## 2018-04-25 ENCOUNTER — Inpatient Hospital Stay (HOSPITAL_COMMUNITY)
Admission: AD | Admit: 2018-04-25 | Discharge: 2018-04-27 | DRG: 807 | Disposition: A | Discharge status: Home / Self Care | Payer: 59 | Attending: Obstetrics and Gynecology | Admitting: Obstetrics and Gynecology

## 2018-04-25 ENCOUNTER — Inpatient Hospital Stay (HOSPITAL_COMMUNITY): Payer: 59 | Admitting: Anesthesiology

## 2018-04-25 ENCOUNTER — Encounter (HOSPITAL_COMMUNITY): Payer: Self-pay | Admitting: *Deleted

## 2018-04-25 ENCOUNTER — Inpatient Hospital Stay (HOSPITAL_COMMUNITY)
Admission: RE | Admit: 2018-04-25 | Discharge: 2018-04-25 | Disposition: A | Discharge status: Home / Self Care | Payer: 59 | Source: Ambulatory Visit | Attending: Obstetrics and Gynecology | Admitting: Obstetrics and Gynecology

## 2018-04-25 DIAGNOSIS — O9962 Diseases of the digestive system complicating childbirth: Secondary | ICD-10-CM | POA: Diagnosis present

## 2018-04-25 DIAGNOSIS — Z3A38 38 weeks gestation of pregnancy: Secondary | ICD-10-CM | POA: Diagnosis not present

## 2018-04-25 DIAGNOSIS — K219 Gastro-esophageal reflux disease without esophagitis: Secondary | ICD-10-CM | POA: Diagnosis present

## 2018-04-25 DIAGNOSIS — Z880 Allergy status to penicillin: Secondary | ICD-10-CM | POA: Diagnosis not present

## 2018-04-25 DIAGNOSIS — Z3490 Encounter for supervision of normal pregnancy, unspecified, unspecified trimester: Secondary | ICD-10-CM

## 2018-04-25 DIAGNOSIS — Z86711 Personal history of pulmonary embolism: Secondary | ICD-10-CM | POA: Diagnosis not present

## 2018-04-25 DIAGNOSIS — O403XX0 Polyhydramnios, third trimester, not applicable or unspecified: Secondary | ICD-10-CM | POA: Diagnosis present

## 2018-04-25 DIAGNOSIS — O99824 Streptococcus B carrier state complicating childbirth: Secondary | ICD-10-CM | POA: Diagnosis present

## 2018-04-25 DIAGNOSIS — O1002 Pre-existing essential hypertension complicating childbirth: Secondary | ICD-10-CM | POA: Diagnosis present

## 2018-04-25 LAB — HEPATIC FUNCTION PANEL
ALT: 27 U/L (ref 0–44)
AST: 30 U/L (ref 15–41)
Albumin: 2.7 g/dL — ABNORMAL LOW (ref 3.5–5.0)
Alkaline Phosphatase: 116 U/L (ref 38–126)
Bilirubin, Direct: <0.1 mg/dL (ref 0.0–0.2)
Total Bilirubin: 0.3 mg/dL (ref 0.3–1.2)
Total Protein: 6.2 g/dL — ABNORMAL LOW (ref 6.5–8.1)

## 2018-04-25 LAB — CBC
HCT: 34.3 % — ABNORMAL LOW (ref 36.0–46.0)
Hemoglobin: 11.3 g/dL — ABNORMAL LOW (ref 12.0–15.0)
MCH: 31.1 pg (ref 26.0–34.0)
MCHC: 32.9 g/dL (ref 30.0–36.0)
MCV: 94.5 fL (ref 80.0–100.0)
Platelets: 175 10*3/uL (ref 150–400)
RBC: 3.63 MIL/uL — ABNORMAL LOW (ref 3.87–5.11)
RDW: 14.1 % (ref 11.5–15.5)
WBC: 7.4 10*3/uL (ref 4.0–10.5)
nRBC: 0 % (ref 0.0–0.2)

## 2018-04-25 LAB — COMPREHENSIVE METABOLIC PANEL
ALT: 29 U/L (ref 0–44)
AST: 32 U/L (ref 15–41)
Albumin: 2.6 g/dL — ABNORMAL LOW (ref 3.5–5.0)
Alkaline Phosphatase: 115 U/L (ref 38–126)
Anion gap: 6 (ref 5–15)
BUN: 7 mg/dL (ref 6–20)
CALCIUM: 8.9 mg/dL (ref 8.9–10.3)
CO2: 22 mmol/L (ref 22–32)
CREATININE: 0.72 mg/dL (ref 0.44–1.00)
Chloride: 108 mmol/L (ref 98–111)
GFR calc Af Amer: >60 mL/min (ref >60)
GFR calc non Af Amer: >60 mL/min (ref >60)
Glucose, Bld: 109 mg/dL — ABNORMAL HIGH (ref 70–99)
Potassium: 3.6 mmol/L (ref 3.5–5.1)
Sodium: 136 mmol/L (ref 135–145)
Total Bilirubin: 0.5 mg/dL (ref 0.3–1.2)
Total Protein: 6.6 g/dL (ref 6.5–8.1)

## 2018-04-25 LAB — PROTEIN / CREATININE RATIO, URINE
Creatinine, Urine: 43.13 mg/dL
Total Protein, Urine: <6 mg/dL

## 2018-04-25 LAB — CREATININE, SERUM
Creatinine, Ser: 0.68 mg/dL (ref 0.44–1.00)
GFR calc non Af Amer: >60 mL/min (ref >60)

## 2018-04-25 LAB — LACTATE DEHYDROGENASE: LDH: 158 U/L (ref 98–192)

## 2018-04-25 LAB — RPR: RPR: NONREACTIVE

## 2018-04-25 LAB — PREPARE RBC (CROSSMATCH)

## 2018-04-25 LAB — URIC ACID: Uric Acid, Serum: 5.8 mg/dL (ref 2.5–7.1)

## 2018-04-25 MED ORDER — VANCOMYCIN HCL IN DEXTROSE 1-5 GM/200ML-% IV SOLN
1000.0000 mg | Freq: Two times a day (BID) | INTRAVENOUS | Status: DC
  Administered 2018-04-25: 1000 mg via INTRAVENOUS
  Filled 2018-04-25: qty 200

## 2018-04-25 MED ORDER — OXYCODONE HCL 5 MG PO TABS: 10.0000 mg | ORAL_TABLET | ORAL | Status: DC | PRN

## 2018-04-25 MED ORDER — OXYTOCIN 40 UNITS IN NORMAL SALINE INFUSION - SIMPLE MED: 2.5000 [IU]/h | INTRAVENOUS | Status: DC

## 2018-04-25 MED ORDER — MISOPROSTOL 200 MCG PO TABS
ORAL_TABLET | ORAL | Status: AC
  Filled 2018-04-25: qty 1

## 2018-04-25 MED ORDER — MISOPROSTOL 200 MCG PO TABS
1000.0000 ug | ORAL_TABLET | Freq: Once | ORAL | Status: AC
  Administered 2018-04-25: 1000 ug via RECTAL

## 2018-04-25 MED ORDER — DIPHENHYDRAMINE HCL 25 MG PO CAPS: 25.0000 mg | ORAL_CAPSULE | Freq: Four times a day (QID) | ORAL | Status: DC | PRN

## 2018-04-25 MED ORDER — ONDANSETRON HCL 4 MG PO TABS: 4.0000 mg | ORAL_TABLET | ORAL | Status: DC | PRN

## 2018-04-25 MED ORDER — ONDANSETRON HCL 4 MG/2ML IJ SOLN: 4.0000 mg | Freq: Four times a day (QID) | INTRAMUSCULAR | Status: DC | PRN

## 2018-04-25 MED ORDER — OXYTOCIN 40 UNITS IN NORMAL SALINE INFUSION - SIMPLE MED
1.0000 m[IU]/min | INTRAVENOUS | Status: DC
  Administered 2018-04-25: 2 m[IU]/min via INTRAVENOUS
  Filled 2018-04-25: qty 1000

## 2018-04-25 MED ORDER — SODIUM CHLORIDE 0.9% IV SOLUTION: Freq: Once | INTRAVENOUS | Status: DC

## 2018-04-25 MED ORDER — ENOXAPARIN SODIUM 40 MG/0.4ML ~~LOC~~ SOLN: 40.0000 mg | SUBCUTANEOUS | Status: DC

## 2018-04-25 MED ORDER — LACTATED RINGERS IV SOLN
INTRAVENOUS | Status: DC
  Administered 2018-04-25 (×2): via INTRAVENOUS

## 2018-04-25 MED ORDER — OXYCODONE-ACETAMINOPHEN 5-325 MG PO TABS: 1.0000 | ORAL_TABLET | ORAL | Status: DC | PRN

## 2018-04-25 MED ORDER — LACTATED RINGERS IV SOLN: 500.0000 mL | Freq: Once | INTRAVENOUS | Status: DC

## 2018-04-25 MED ORDER — PHENYLEPHRINE 40 MCG/ML (10ML) SYRINGE FOR IV PUSH (FOR BLOOD PRESSURE SUPPORT): 80.0000 ug | PREFILLED_SYRINGE | INTRAVENOUS | Status: DC | PRN

## 2018-04-25 MED ORDER — SOD CITRATE-CITRIC ACID 500-334 MG/5ML PO SOLN: 30.0000 mL | ORAL | Status: DC | PRN

## 2018-04-25 MED ORDER — BENZOCAINE-MENTHOL 20-0.5 % EX AERO
1.0000 "application " | INHALATION_SPRAY | CUTANEOUS | Status: DC | PRN
  Filled 2018-04-25: qty 56

## 2018-04-25 MED ORDER — LACTATED RINGERS IV SOLN: 500.0000 mL | INTRAVENOUS | Status: DC | PRN

## 2018-04-25 MED ORDER — EPHEDRINE 5 MG/ML INJ: 10.0000 mg | INTRAVENOUS | Status: DC | PRN

## 2018-04-25 MED ORDER — TETANUS-DIPHTH-ACELL PERTUSSIS 5-2.5-18.5 LF-MCG/0.5 IM SUSP: 0.5000 mL | Freq: Once | INTRAMUSCULAR | Status: DC

## 2018-04-25 MED ORDER — WITCH HAZEL-GLYCERIN EX PADS: 1.0000 "application " | MEDICATED_PAD | CUTANEOUS | Status: DC | PRN

## 2018-04-25 MED ORDER — OXYTOCIN BOLUS FROM INFUSION
500.0000 mL | Freq: Once | INTRAVENOUS | Status: AC
  Administered 2018-04-25: 500 mL via INTRAVENOUS

## 2018-04-25 MED ORDER — OXYCODONE-ACETAMINOPHEN 5-325 MG PO TABS: 2.0000 | ORAL_TABLET | ORAL | Status: DC | PRN

## 2018-04-25 MED ORDER — COCONUT OIL OIL: 1.0000 "application " | TOPICAL_OIL | Status: DC | PRN

## 2018-04-25 MED ORDER — ACETAMINOPHEN 500 MG PO TABS
1000.0000 mg | ORAL_TABLET | Freq: Once | ORAL | Status: AC
  Administered 2018-04-25: 1000 mg via ORAL
  Filled 2018-04-25: qty 2

## 2018-04-25 MED ORDER — LIDOCAINE HCL (PF) 1 % IJ SOLN: 30.0000 mL | INTRAMUSCULAR | Status: DC | PRN

## 2018-04-25 MED ORDER — LABETALOL HCL 200 MG PO TABS
200.0000 mg | ORAL_TABLET | Freq: Two times a day (BID) | ORAL | Status: DC
  Administered 2018-04-25: 200 mg via ORAL
  Filled 2018-04-25: qty 1

## 2018-04-25 MED ORDER — LABETALOL HCL 200 MG PO TABS
200.0000 mg | ORAL_TABLET | Freq: Two times a day (BID) | ORAL | Status: DC
  Administered 2018-04-25 – 2018-04-27 (×4): 200 mg via ORAL
  Filled 2018-04-25 (×4): qty 1

## 2018-04-25 MED ORDER — DIPHENHYDRAMINE HCL 50 MG/ML IJ SOLN: 12.5000 mg | INTRAMUSCULAR | Status: DC | PRN

## 2018-04-25 MED ORDER — BUTORPHANOL TARTRATE 1 MG/ML IJ SOLN: 1.0000 mg | INTRAMUSCULAR | Status: DC | PRN

## 2018-04-25 MED ORDER — ACETAMINOPHEN 325 MG PO TABS
650.0000 mg | ORAL_TABLET | ORAL | Status: DC | PRN
  Administered 2018-04-26: 650 mg via ORAL
  Filled 2018-04-25: qty 2

## 2018-04-25 MED ORDER — TERBUTALINE SULFATE 1 MG/ML IJ SOLN: 0.2500 mg | Freq: Once | INTRAMUSCULAR | Status: DC | PRN

## 2018-04-25 MED ORDER — ONDANSETRON HCL 4 MG/2ML IJ SOLN: 4.0000 mg | INTRAMUSCULAR | Status: DC | PRN

## 2018-04-25 MED ORDER — IBUPROFEN 600 MG PO TABS
600.0000 mg | ORAL_TABLET | Freq: Four times a day (QID) | ORAL | Status: DC
  Administered 2018-04-25 – 2018-04-27 (×8): 600 mg via ORAL
  Filled 2018-04-25 (×8): qty 1

## 2018-04-25 MED ORDER — DIBUCAINE 1 % RE OINT: 1.0000 "application " | TOPICAL_OINTMENT | RECTAL | Status: DC | PRN

## 2018-04-25 MED ORDER — OXYCODONE HCL 5 MG PO TABS
5.0000 mg | ORAL_TABLET | ORAL | Status: DC | PRN
  Administered 2018-04-27: 5 mg via ORAL
  Filled 2018-04-25: qty 1

## 2018-04-25 MED ORDER — ZOLPIDEM TARTRATE 5 MG PO TABS: 5.0000 mg | ORAL_TABLET | Freq: Every evening | ORAL | Status: DC | PRN

## 2018-04-25 MED ORDER — SENNOSIDES-DOCUSATE SODIUM 8.6-50 MG PO TABS
2.0000 | ORAL_TABLET | ORAL | Status: DC
  Administered 2018-04-25 – 2018-04-26 (×2): 2 via ORAL
  Filled 2018-04-25 (×2): qty 2

## 2018-04-25 MED ORDER — PHENYLEPHRINE 40 MCG/ML (10ML) SYRINGE FOR IV PUSH (FOR BLOOD PRESSURE SUPPORT)
80.0000 ug | PREFILLED_SYRINGE | INTRAVENOUS | Status: DC | PRN
  Filled 2018-04-25: qty 10

## 2018-04-25 MED ORDER — ACETAMINOPHEN 325 MG PO TABS: 650.0000 mg | ORAL_TABLET | ORAL | Status: DC | PRN

## 2018-04-25 MED ORDER — LIDOCAINE HCL (PF) 1 % IJ SOLN
INTRAMUSCULAR | Status: DC | PRN
  Administered 2018-04-25: 5 mL via EPIDURAL

## 2018-04-25 MED ORDER — PRENATAL MULTIVITAMIN CH
1.0000 | ORAL_TABLET | Freq: Every day | ORAL | Status: DC
  Administered 2018-04-26 – 2018-04-27 (×2): 1 via ORAL
  Filled 2018-04-25 (×2): qty 1

## 2018-04-25 MED ORDER — MISOPROSTOL 200 MCG PO TABS
ORAL_TABLET | ORAL | Status: AC
  Filled 2018-04-25: qty 4

## 2018-04-25 MED ORDER — FENTANYL-BUPIVACAINE-NACL 0.5-0.125-0.9 MG/250ML-% EP SOLN
12.0000 mL/h | EPIDURAL | Status: DC | PRN
  Filled 2018-04-25: qty 250

## 2018-04-25 MED ORDER — ENOXAPARIN SODIUM 60 MG/0.6ML ~~LOC~~ SOLN
60.0000 mg | SUBCUTANEOUS | Status: DC
  Administered 2018-04-26 – 2018-04-27 (×2): 60 mg via SUBCUTANEOUS
  Filled 2018-04-25 (×2): qty 0.6

## 2018-04-25 MED ORDER — SODIUM CHLORIDE (PF) 0.9 % IJ SOLN
INTRAMUSCULAR | Status: DC | PRN
  Administered 2018-04-25: 12 mL/h via EPIDURAL

## 2018-04-25 MED ORDER — SIMETHICONE 80 MG PO CHEW: 80.0000 mg | CHEWABLE_TABLET | ORAL | Status: DC | PRN

## 2018-04-25 NOTE — Anesthesia Procedure Notes (Signed)
Epidural Patient location during procedure: OB Start time: 04/25/2018 10:11 AM End time: 04/25/2018 10:26 AM  Staffing Anesthesiologist: Trevor Iha, MD Performed: anesthesiologist   Preanesthetic Checklist Completed: patient identified, site marked, surgical consent, pre-op evaluation, timeout performed, IV checked, risks and benefits discussed and monitors and equipment checked  Epidural Patient position: sitting Prep: site prepped and draped and DuraPrep Patient monitoring: continuous pulse ox and blood pressure Approach: midline Location: L3-L4 Injection technique: LOR air  Needle:  Needle type: Tuohy  Needle gauge: 17 G Needle length: 9 cm and 9 Needle insertion depth: 9 cm Catheter type: closed end flexible Catheter size: 19 Gauge Catheter at skin depth: 13 cm Test dose: negative  Assessment Events: blood not aspirated, injection not painful, no injection resistance, negative IV test and no paresthesia  Additional Notes Patient identified. Risks/Benefits/Options discussed with patient including but not limited to bleeding, infection, nerve damage, paralysis, failed block, incomplete pain control, headache, blood pressure changes, nausea, vomiting, reactions to medication both or allergic, itching and postpartum back pain. Confirmed with bedside nurse the patient's most recent platelet count. Confirmed with patient that they are not currently taking any anticoagulation, have any bleeding history or any family history of bleeding disorders. Patient expressed understanding and wished to proceed. All questions were answered. Sterile technique was used throughout the entire procedure. Please see nursing notes for vital signs. Test dose was given through epidural needle and negative prior to continuing to dose epidural or start infusion. Warning signs of high block given to the patient including shortness of breath, tingling/numbness in hands, complete motor block, or any  concerning symptoms with instructions to call for help. Patient was given instructions on fall risk and not to get out of bed. All questions and concerns addressed with instructions to call with any issues.  1 Attempt (S) . Patient tolerated procedure well.

## 2018-04-25 NOTE — Progress Notes (Signed)
Patient ID: Mindy Wade, female   DOB: [birth year], 38 y.o.   MRN: [id number] Pt with no complaints - denies HA or blurry vision. No contractions. +Fms VS - 104/57 CAT 1, 150 TOCO - ctxs irregularly SVE - 3cm dil  A/P: [id number] at 38weeks for iol due to chtn -stable         Vanc for GBS treatment         Start pitocin; AROM after 6 hours if none spontaneously         Epidural prn         Stopped lovenox ( hx PE) 04/23/18         Polyhydramnious         Anticipate svd

## 2018-04-25 NOTE — Anesthesia Postprocedure Evaluation (Signed)
Anesthesia Post Note  Patient: Mindy Wade  Procedure(s) Performed: AN AD HOC LABOR EPIDURAL     Patient location during evaluation: Mother Baby Anesthesia Type: Epidural Level of consciousness: awake, awake and alert and oriented Pain management: pain level controlled Vital Signs Assessment: post-procedure vital signs reviewed and stable Respiratory status: spontaneous breathing, nonlabored ventilation and respiratory function stable Cardiovascular status: stable Postop Assessment: no headache, no backache, no apparent nausea or vomiting, adequate PO intake, able to ambulate and patient able to bend at knees Anesthetic complications: no    Last Vitals:  Vitals:   04/25/18 1535 04/25/18 1635  BP: 113/77 130/78  Pulse: 66 68  Resp: 18 18  Temp: 36.9 C 37.1 C  SpO2:      Last Pain:  Vitals:   04/25/18 1635  TempSrc: Oral  PainSc:    Pain Goal:                   [name]

## 2018-04-25 NOTE — Anesthesia Preprocedure Evaluation (Addendum)
Anesthesia Evaluation  Patient identified by MRN, date of birth, ID band Patient awake    Reviewed: Allergy & Precautions, H&P , NPO status , Patient's Chart, lab work & pertinent test results  Airway Mallampati: II  TM Distance: >3 FB Neck ROM: Full    Dental no notable dental hx. (+) Teeth Intact   Pulmonary neg pulmonary ROS,    Pulmonary exam normal breath sounds clear to auscultation       Cardiovascular Exercise Tolerance: Good hypertension, Pt. on medications and Pt. on home beta blockers + DVT  Normal cardiovascular exam Rhythm:Regular Rate:Normal  Chronic HTN  W PIH   Neuro/Psych  Headaches, negative psych ROS   GI/Hepatic GERD  ,  Endo/Other    Renal/GU      Musculoskeletal   Abdominal (+) + obese,   Peds  Hematology Hgb 11.3 Plt 175   Anesthesia Other Findings   Reproductive/Obstetrics (+) Pregnancy Uric Acid 5.8                            Anesthesia Physical Anesthesia Plan  ASA: III  Anesthesia Plan: Epidural   Post-op Pain Management:    Induction:   PONV Risk Score and Plan:   Airway Management Planned:   Additional Equipment:   Intra-op Plan:   Post-operative Plan:   Informed Consent: I have reviewed the patients History and Physical, chart, labs and discussed the procedure including the risks, benefits and alternatives for the proposed anesthesia with the patient or authorized representative who has indicated his/her understanding and acceptance.       Plan Discussed with:   Anesthesia Plan Comments: (G7p6 c PIH Chronic HTN , hx of DVT off lovenox since 2/26 for LE)        Anesthesia Quick Evaluation

## 2018-04-25 NOTE — Progress Notes (Signed)
Patient ID: Mindy Wade, female   DOB: [birth year], 38 y.o.   MRN: [id number] Pt comfortable with epidural. No complaints VSS CAT 1, 140 Ctxs irregular SVE - 5/70/-2  A/P: Progressing in labor on pitocin - stable         AROM performed after discussed with pt - clear fluid         Anticipate svd

## 2018-04-25 NOTE — Anesthesia Pain Management Evaluation Note (Signed)
  [facility] Pain Management Visit Note  Patient: Mindy Wade, 38 y.o., female  "Hello I am a member of the anesthesia team at [facility]. We have an anesthesia team available at all times to provide care throughout the hospital, including epidural management and anesthesia for C-section. I don't know your plan for the delivery whether it a natural birth, water birth, IV sedation, nitrous supplementation, doula or epidural, but we want to meet your pain goals."   1.Was your pain managed to your expectations on prior hospitalizations?   Yes   2.What is your expectation for pain management during this hospitalization?     Epidural  3.How can we help you reach that goal? epidural  Record the patient's initial score and the patient's pain goal.   Pain: 0  Pain Goal: 8 [facility] wants you to be able to say your pain was always managed very well.  Rica Records 04/25/2018

## 2018-04-26 LAB — CBC
HCT: 33.7 % — ABNORMAL LOW (ref 36.0–46.0)
Hemoglobin: 11.3 g/dL — ABNORMAL LOW (ref 12.0–15.0)
MCH: 31.5 pg (ref 26.0–34.0)
MCHC: 33.5 g/dL (ref 30.0–36.0)
MCV: 93.9 fL (ref 80.0–100.0)
Platelets: 168 10*3/uL (ref 150–400)
RBC: 3.59 MIL/uL — ABNORMAL LOW (ref 3.87–5.11)
RDW: 14.1 % (ref 11.5–15.5)
WBC: 10.3 10*3/uL (ref 4.0–10.5)
nRBC: 0 % (ref 0.0–0.2)

## 2018-04-26 NOTE — Lactation Note (Signed)
This note was copied from a baby's chart. Lactation Consultation Note  Patient Name: Mindy Wade [id number] Date: 04/26/2018 Reason for consult: Follow-up assessment;Early term 37-38.6wks  F/U with P7 mom, baby is 71 hours old with 3% wt loss. Mom very experienced with breastfeeding. [facility] at bedside right now to do 24 hour testing. Mom states baby just fed for 30 min less than one hour prior. Infant in mom's lap and content. Mom states breastfeeding is going well, but latch was a little painful last feeding. Mom states baby was very aggressive at the breast and she thinks she didn't get enough tissue in baby's mouth. Discussed breaking infant's seal and starting again to achieve comfortable latch. Mom comfortable with hand expression and breast massage prior to latching infant. Mom states she has been alternating her breasts and feeding positions with each feeding. Reviewed expected output by infant who has had multiple wet diapers today already. Reviewed putting baby to the breast 8-12 times per 24 hours. Mom anticipating discharge tomorrow afternoon. Reviewed prevention and treatment of engorgement at home with ice packs. Mom states she got a DEBP from her insurance company. Encouraged mom to call out with any issues/concerns for LC.  Maternal Data Has patient been taught Hand Expression?: Yes(reinforced) Does the patient have breastfeeding experience prior to this delivery?: Yes  Consult Status Consult Status: Follow-up Date: 04/27/18 Follow-up type: In-patient    Virgia Land 04/26/2018, 3:39 PM

## 2018-04-26 NOTE — Progress Notes (Signed)
Post Partum Day 1 Subjective: no complaints, voiding, tolerating PO, + flatus and bonding well with baby. Pt is breastfeeding with no issues. She has no complaints.   Objective: Blood pressure 121/74, pulse 66, temperature 98.9 F (37.2 C), temperature source Oral, resp. rate 18, height 5\' 8"  (1.727 m), weight 117 kg, last menstrual period 08/09/2017, SpO2 98 %, unknown if currently breastfeeding.  Physical Exam:  General: alert, cooperative and no distress Lochia: appropriate Uterine Fundus: firm Incision: no significant drainage DVT Evaluation: No evidence of DVT seen on physical exam. Negative Homan's sign.  Recent Labs    04/25/18 0744 04/26/18 0442  HGB 11.3* 11.3*  HCT 34.3* 33.7*    Assessment/Plan: Plan for discharge tomorrow   LOS: 1 day   Jeremiah Tarpley W Ameriah Lint 04/26/2018, 7:00 AM

## 2018-04-26 NOTE — Lactation Note (Signed)
This note was copied from a baby's chart. Lactation Consultation Note  Patient Name: Mindy Wade [id number] Date: 04/26/2018 Reason for consult: Initial assessment;Early term 37-38.6wks P7, 11 hour female infant. Per mom, breastfeeding is going well. Per mom, infant had 2 voids and 4 stools. Mom is experienced at breastfeeding she breastfeed 4 of her children previously for 4 months before returning to work. LC did  not observe a latch at this time, mom breastfeed 1 hour prior to [facility] entering the room  for 30 minutes. LC discussed I & O. Reviewed Baby & Me book's Breastfeeding Basics.  Mom knows to call Nurse or LC if she has any questions, concerns or need assistance with latching infant to breast. Mom made aware of O/P services, breastfeeding support groups, community resources, and our phone # for post-discharge questions.  Maternal Data Formula Feeding for Exclusion: Yes Reason for exclusion: Mother's choice to formula and breast feed on admission Has patient been taught Hand Expression?: Yes Does the patient have breastfeeding experience prior to this delivery?: Yes  Feeding Feeding Type: Breast Fed  LATCH Score Latch: Repeated attempts needed to sustain latch, nipple held in mouth throughout feeding, stimulation needed to elicit sucking reflex.  Audible Swallowing: Spontaneous and intermittent  Type of Nipple: Everted at rest and after stimulation  Comfort (Breast/Nipple): Soft / non-tender  Hold (Positioning): No assistance needed to correctly position infant at breast.  LATCH Score: 9  Interventions Interventions: Breast feeding basics reviewed  Lactation Tools Discussed/Used WIC Program: No   Consult Status Consult Status: Follow-up Date: 04/26/18 Follow-up type: In-patient    Danelle Earthly 04/26/2018, 12:33 AM

## 2018-04-27 DIAGNOSIS — O403XX0 Polyhydramnios, third trimester, not applicable or unspecified: Secondary | ICD-10-CM | POA: Diagnosis present

## 2018-04-27 DIAGNOSIS — Z880 Allergy status to penicillin: Secondary | ICD-10-CM | POA: Diagnosis not present

## 2018-04-27 DIAGNOSIS — O9962 Diseases of the digestive system complicating childbirth: Secondary | ICD-10-CM | POA: Diagnosis present

## 2018-04-27 DIAGNOSIS — K219 Gastro-esophageal reflux disease without esophagitis: Secondary | ICD-10-CM | POA: Diagnosis present

## 2018-04-27 DIAGNOSIS — O99824 Streptococcus B carrier state complicating childbirth: Secondary | ICD-10-CM | POA: Diagnosis present

## 2018-04-27 DIAGNOSIS — Z86711 Personal history of pulmonary embolism: Secondary | ICD-10-CM | POA: Diagnosis not present

## 2018-04-27 DIAGNOSIS — O1002 Pre-existing essential hypertension complicating childbirth: Secondary | ICD-10-CM | POA: Diagnosis not present

## 2018-04-27 DIAGNOSIS — Z3A38 38 weeks gestation of pregnancy: Secondary | ICD-10-CM | POA: Diagnosis not present

## 2018-04-27 MED ORDER — LABETALOL HCL 200 MG PO TABS: 200.0000 mg | ORAL_TABLET | Freq: Two times a day (BID) | ORAL | 0 refills | Status: DC

## 2018-04-27 MED ORDER — ENOXAPARIN SODIUM 60 MG/0.6ML ~~LOC~~ SOLN: 60.0000 mg | SUBCUTANEOUS | 0 refills | Status: DC

## 2018-04-27 MED ORDER — IBUPROFEN 600 MG PO TABS: 600.0000 mg | ORAL_TABLET | Freq: Four times a day (QID) | ORAL | 1 refills | Status: DC | PRN

## 2018-04-27 NOTE — Lactation Note (Addendum)
This note was copied from a baby's chart. Lactation Consultation Note  Patient Name: Mindy Wade [id number] Date: 04/27/2018   Baby Mindy Escovedo now 4 hours old born at [redacted] weeks gestation with 6 percent weight loss.  Mom vaginal delivery.  Mom reports more breast fullness today.  Mom reports nipples are sore.  Urged hand express before latching to get milk started for her and hand express past bf and rub expressed mothers milk on nipples/air dry.  Discussed hand expression and spoon feeding past breastfeeding and offering dessert.  Urged parents to watch [employer] hand expression video and let dad do some spoon feeding past bf to give dessert.  Discussed RPS since breasts are fuller to try and help baby get a big mouthfull of breast and hand expression prior to latching..  Infant very content in moms arms when first entered room.  Infant started cuing while there.  Offered to observe feeding .Mom agreed.  Infant in shirt and diaper/room is warm/infant roots and opens wide.Mom trying to feed her in cradle position and infant head bobbing. Showed mom how to switch from cradle to cross cradle to give her more heard support.Moms nipples are very reddened on tips.  Assist with latching on left breast. Infant takes a few sucks and hold in mouth.  Urged dad to massage down and compress breast gently and not to move around much to help her get more milk while feeding.  She was still sleepy.  Switched to modified koala hold.Mom broke suction took her off/nipple round and everted.  She was a little more vigorous in this hold/ears moving slightly but still sleepy.  Left mom, baby, and dad breastfeeding.  Urged spoon feeding past breastfeeding as much as mom and dad can get out and get her to take until breastfeeding well.Urged to d/c pacifier until breastfeeding well established.  Discussed chin tug and hugging baby in closer to the breast.  Urged mom to call lactation as needed.Discussed nipple shape past  breastfeeding. Mom report sometimes nipples look odd shaped when comes off.  Mom reports sometimes looks like a tube of lipstick. Mom repors she is often  a sleepy feeder.  Sometimes does better than others but falls asleep easily at the breast. Showed parents how to do some stimulation to keep her awake while feeding. Mom reports she has been alternating breastfeeding positions with her from the football to cradle hold. Mom has cone breastfeeding resources for home use.  Maternal Data    Feeding    LATCH Score                   Interventions    Lactation Tools Discussed/Used     Consult Status      Mindy Wade 04/27/2018, 11:57 AM

## 2018-04-27 NOTE — Discharge Summary (Signed)
OB Discharge Summary     Patient Name: Mindy Wade DOB: [birth year] MRN: [id number]  Date of admission: 04/25/2018 Delivering MD: Pryor Ochoa [facility]   Date of discharge: 04/27/2018  Admitting diagnosis: pregnancy Intrauterine pregnancy: [redacted]w[redacted]d     Secondary diagnosis:  Active Problems:   Postpartum care following vaginal delivery   Term pregnancy   Vaginal delivery   Adherent placenta  Additional problems: Hx of PE     Discharge diagnosis: Term Pregnancy Delivered and CHTN                                                                                                Post partum procedures:none  Augmentation: AROM and Pitocin  Complications: None  Hospital course:  Induction of Labor With Vaginal Delivery   38 y.o. yo [phone] at [redacted]w[redacted]d was admitted to the hospital 04/25/2018 for induction of labor.  Indication for induction: Favorable cervix at term, AMA and chtn.  Patient had an uncomplicated labor course as follows: Membrane Rupture Time/Date: 11:30 AM ,04/25/2018   Intrapartum Procedures: Episiotomy: None [1]                                         Lacerations:  None [1]  Patient had delivery of a Viable infant.  Information for the patient's newborn:  Chritina, Osier [[id number]]  Delivery Method: Vaginal, Spontaneous(Filed from Delivery Summary)   04/25/2018  Details of delivery can be found in separate delivery note.  Patient had a routine postpartum course. Patient is discharged home 04/27/18.  Physical exam  Vitals:   04/26/18 0916 04/26/18 1421 04/26/18 2155 04/27/18 0559  BP: 115/84 128/83 127/76 125/80  Pulse: 73 71 80 74  Resp:  17 18 18   Temp:  98.3 F (36.8 C) 98.5 F (36.9 C) 98.2 F (36.8 C)  TempSrc:  Oral Oral   SpO2:  99%  100%  Weight:      Height:       General: alert, cooperative and no distress Lochia: appropriate Uterine Fundus: firm Incision: N/A DVT Evaluation: No evidence of DVT seen on physical exam. Labs: Lab Results   Component Value Date   WBC 10.3 04/26/2018   HGB 11.3 (L) 04/26/2018   HCT 33.7 (L) 04/26/2018   MCV 93.9 04/26/2018   PLT 168 04/26/2018   CMP Latest Ref Rng & Units 04/25/2018  Glucose 70 - 99 mg/dL -  BUN 6 - 20 mg/dL -  Creatinine 2.92 - 4.46 mg/dL 2.86  Sodium 381 - 771 mmol/L -  Potassium 3.5 - 5.1 mmol/L -  Chloride 98 - 111 mmol/L -  CO2 22 - 32 mmol/L -  Calcium 8.9 - 10.3 mg/dL -  Total Protein 6.5 - 8.1 g/dL -  Total Bilirubin 0.3 - 1.2 mg/dL -  Alkaline Phos 38 - 165 U/L -  AST 15 - 41 U/L -  ALT 0 - 44 U/L -    Discharge instruction: per After Visit Summary and "Baby and Me Booklet".  After  visit meds:  Allergies as of 04/27/2018      Reactions   Penicillins Anaphylaxis   Has patient had a PCN reaction causing immediate rash, facial/tongue/throat swelling, SOB or lightheadedness with hypotension: Yes Has patient had a PCN reaction causing severe rash involving mucus membranes or skin necrosis: No Has patient had a PCN reaction that required hospitalization No Has patient had a PCN reaction occurring within the last 10 years: No If all of the above answers are "NO", then may proceed with Cephalosporin use.      Medication List    STOP taking these medications   famotidine 20 MG tablet Commonly known as:  PEPCID   guaiFENesin-dextromethorphan 100-10 MG/5ML syrup Commonly known as:  ROBITUSSIN DM     TAKE these medications   enoxaparin 60 MG/0.6ML injection Commonly known as:  LOVENOX Inject 0.6 mLs (60 mg total) into the skin daily. Start taking on:  April 28, 2018   ibuprofen 600 MG tablet Commonly known as:  ADVIL,MOTRIN Take 1 tablet (600 mg total) by mouth every 6 (six) hours as needed for moderate pain or cramping.   IRON PO Take 1 tablet by mouth every evening.   labetalol 200 MG tablet Commonly known as:  NORMODYNE Take 1 tablet (200 mg total) by mouth 2 (two) times daily for 20 days. What changed:  additional instructions   VITAFOL FE+  90-1-200 & 50 MG Cppk Take 1 capsule by mouth daily.       Diet: low salt diet  Activity: Advance as tolerated. Pelvic rest for 6 weeks.   Outpatient follow up:2-3 days for BP check and 6 weeks for postpartum visit Follow up Appt:No future appointments. Follow up Visit:No follow-ups on file.  Postpartum contraception: Undecided  Newborn Data: Live born female  Birth Weight: 7 lb 6 oz (3345 g) APGAR: 9, 9  Newborn Delivery   Birth date/time:  04/25/2018 13:11:00 Delivery type:  Vaginal, Spontaneous     Baby Feeding: Breast Disposition:home with mother   04/27/2018 [name], DO

## 2018-04-27 NOTE — Progress Notes (Addendum)
Patient ID: Mindy Wade, female   DOB: [birth year], 38 y.o.   MRN: [id number] Pt doing well with no complaints. Bonding well with [name] breastfeeding with no issues. Lochia mild. No SOB/CP or HA. Ambulating and voiding well. Ready for discharge to home  VS- 125/80 ABD - FF EXT - no edema or homans  A/P: PPD#2 s/p svd - stable        Discharge instructions reviewed: BP check in office in 2-3 days; continue lovenox for 6 weeks, routine pp visit in 6 weeks

## 2018-04-27 NOTE — Discharge Instructions (Signed)
Call the office with any concerns [phone]

## 2018-04-28 ENCOUNTER — Other Ambulatory Visit: Payer: Self-pay

## 2018-04-28 ENCOUNTER — Emergency Department (HOSPITAL_COMMUNITY)
Admission: EM | Admit: 2018-04-28 | Discharge: 2018-04-29 | Disposition: A | Discharge status: Home / Self Care | Payer: 59 | Attending: Emergency Medicine | Admitting: Emergency Medicine

## 2018-04-28 ENCOUNTER — Emergency Department (HOSPITAL_COMMUNITY): Payer: 59

## 2018-04-28 ENCOUNTER — Encounter (HOSPITAL_COMMUNITY): Payer: Self-pay

## 2018-04-28 DIAGNOSIS — R0602 Shortness of breath: Secondary | ICD-10-CM | POA: Insufficient documentation

## 2018-04-28 DIAGNOSIS — Z79899 Other long term (current) drug therapy: Secondary | ICD-10-CM | POA: Insufficient documentation

## 2018-04-28 DIAGNOSIS — R079 Chest pain, unspecified: Secondary | ICD-10-CM | POA: Diagnosis not present

## 2018-04-28 DIAGNOSIS — I10 Essential (primary) hypertension: Secondary | ICD-10-CM | POA: Insufficient documentation

## 2018-04-28 DIAGNOSIS — R002 Palpitations: Secondary | ICD-10-CM | POA: Insufficient documentation

## 2018-04-28 LAB — CBC
HCT: 34.2 % — ABNORMAL LOW (ref 36.0–46.0)
HEMOGLOBIN: 11.1 g/dL — AB (ref 12.0–15.0)
MCH: 30.3 pg (ref 26.0–34.0)
MCHC: 32.5 g/dL (ref 30.0–36.0)
MCV: 93.4 fL (ref 80.0–100.0)
Platelets: 224 10*3/uL (ref 150–400)
RBC: 3.66 MIL/uL — ABNORMAL LOW (ref 3.87–5.11)
RDW: 13.6 % (ref 11.5–15.5)
WBC: 6.6 10*3/uL (ref 4.0–10.5)
nRBC: 0 % (ref 0.0–0.2)

## 2018-04-28 LAB — BASIC METABOLIC PANEL
ANION GAP: 9 (ref 5–15)
BUN: 14 mg/dL (ref 6–20)
CO2: 20 mmol/L — ABNORMAL LOW (ref 22–32)
Calcium: 8.9 mg/dL (ref 8.9–10.3)
Chloride: 107 mmol/L (ref 98–111)
Creatinine, Ser: 0.69 mg/dL (ref 0.44–1.00)
GFR calc Af Amer: >60 mL/min (ref >60)
Glucose, Bld: 94 mg/dL (ref 70–99)
Potassium: 3.9 mmol/L (ref 3.5–5.1)
Sodium: 136 mmol/L (ref 135–145)

## 2018-04-28 LAB — TYPE AND SCREEN
ABO/RH(D): A POS
Antibody Screen: POSITIVE
Donor AG Type: NEGATIVE
Donor AG Type: NEGATIVE
Unit division: 0
Unit division: 0
Unit division: 0

## 2018-04-28 LAB — BPAM RBC
Blood Product Expiration Date: 202003222359
Blood Product Expiration Date: 202003222359
Blood Product Expiration Date: 202003222359
Unit Type and Rh: 6200
Unit Type and Rh: 6200
Unit Type and Rh: 6200

## 2018-04-28 LAB — I-STAT BETA HCG BLOOD, ED (MC, WL, AP ONLY): I-stat hCG, quantitative: 1614.9 m[IU]/mL — ABNORMAL HIGH (ref <5)

## 2018-04-28 LAB — I-STAT TROPONIN, ED: Troponin i, poc: 0.03 ng/mL (ref 0.00–0.08)

## 2018-04-28 MED ORDER — SODIUM CHLORIDE 0.9% FLUSH: 3.0000 mL | Freq: Once | INTRAVENOUS | Status: DC

## 2018-04-28 NOTE — ED Triage Notes (Signed)
Pt here with shortness of breath and chest pain for the last 6 hours.  Came from from vaginal delivery of a baby yesterday and pain began today.  Hx of HTN on labetalol for such. A&Ox4.

## 2018-04-29 ENCOUNTER — Emergency Department (HOSPITAL_COMMUNITY): Payer: 59

## 2018-04-29 LAB — HEPATIC FUNCTION PANEL
ALT: 37 U/L (ref 0–44)
AST: 38 U/L (ref 15–41)
Albumin: 2.7 g/dL — ABNORMAL LOW (ref 3.5–5.0)
Alkaline Phosphatase: 104 U/L (ref 38–126)
BILIRUBIN TOTAL: 0.3 mg/dL (ref 0.3–1.2)
Bilirubin, Direct: <0.1 mg/dL (ref 0.0–0.2)
Total Protein: 6.3 g/dL — ABNORMAL LOW (ref 6.5–8.1)

## 2018-04-29 LAB — URINALYSIS, ROUTINE W REFLEX MICROSCOPIC
Bacteria, UA: NONE SEEN
Bilirubin Urine: NEGATIVE
GLUCOSE, UA: NEGATIVE mg/dL
Ketones, ur: NEGATIVE mg/dL
Leukocytes,Ua: NEGATIVE
Nitrite: NEGATIVE
PH: 6 (ref 5.0–8.0)
Protein, ur: NEGATIVE mg/dL
Specific Gravity, Urine: 1.019 (ref 1.005–1.030)

## 2018-04-29 LAB — BRAIN NATRIURETIC PEPTIDE: B Natriuretic Peptide: 60.7 pg/mL (ref 0.0–100.0)

## 2018-04-29 MED ORDER — IOPAMIDOL (ISOVUE-370) INJECTION 76%
INTRAVENOUS | Status: AC
  Filled 2018-04-29: qty 100

## 2018-04-29 MED ORDER — IOHEXOL 350 MG/ML SOLN
75.0000 mL | Freq: Once | INTRAVENOUS | Status: AC | PRN
  Administered 2018-04-29: 75 mL via INTRAVENOUS

## 2018-04-29 NOTE — ED Notes (Signed)
Patient verbalizes understanding of discharge instructions. Opportunity for questioning and answers were provided. Armband removed by staff, pt discharged from ED, ambulatory to lobby with significant other.  

## 2018-04-29 NOTE — ED Provider Notes (Signed)
[facility] EMERGENCY DEPARTMENT Provider Note   CSN: [id number] Arrival date & time: 04/28/18  1934    History   Chief Complaint Chief Complaint  Patient presents with  . Chest Pain  . Shortness of Breath    HPI Mindy Wade is a 38 y.o. female.     The history is provided by the patient and medical records.  Chest Pain  Associated symptoms: shortness of breath   Shortness of Breath  Associated symptoms: chest pain      38 year old female with history of acid reflux, history of DVT and PE in the setting of pregnancy currently on Lovenox, hypertension, presenting to the ED with chest pain, shortness of breath, palpitations.  Patient is status post SVD 5 days ago on 04/25/2018.  Delivery was overall uncomplicated, states her blood pressure is actually well controlled.  She was taken off her Lovenox temporarily during delivery but resumed about 6 hours afterwards, she has been compliant since that time.  She reports yesterday she started feeling poorly with some intermittent chest pressure but also a tightness, shortness of breath, palpitations, and ankle swelling.  States she did not really have any of these symptoms very much during her pregnancy.  States palpitations and shortness of breath is worse with exertion.  She has not had any cough, fever, or other upper respiratory symptoms.  No unilateral leg swelling, some edema at the ankles.  States she checked her blood pressure at home several times a day and noticed it was slightly elevated more than normal, one reading up to the 150s systolic.  States at baseline she is usually 140's/90s.  She has been compliant with her home labetalol.  Denies any known story of coronary artery disease or CHF.  She does follow with a cardiologist at [facility].  She is not a smoker.  Past Medical History:  Diagnosis Date  . AMA (advanced maternal age) multigravida 35+   . Breast feeding status of mother   . GERD (gastroesophageal  reflux disease)   . Headache   . Hx of blood clots   . Hx of varicella   . Hyperlipemia   . Hypertension   . Pregnancy induced hypertension   . Pulmonary embolism [facility])     Patient Active Problem List   Diagnosis Date Noted  . Term pregnancy 04/25/2018  . Vaginal delivery 04/25/2018  . Adherent placenta 04/25/2018  . Missed abortion with fetal demise before 20 completed weeks of gestation 05/03/2017  . Precipitous delivery 02/19/2015  . Postpartum care following vaginal delivery 02/19/2015  . Active labor at term 02/18/2015  . Pre-diabetes 05/14/2014  . Elevated LDL cholesterol level 05/14/2014  . Pulmonary embolism (HCC) 10/17/2013  . Labor and delivery indication for care or intervention 10/09/2013  . Spontaneous abortion in second trimester 10/09/2013  . SVD (spontaneous vaginal delivery) 03/02/2013    Past Surgical History:  Procedure Laterality Date  . CHOLECYSTECTOMY       OB History    Gravida  9   Para  8   Term  7   Preterm  1   AB  1   Living  7     SAB  1   TAB      Ectopic      Multiple  0   Live Births  8            Home Medications    Prior to Admission medications   Medication Sig Start Date End Date Taking?  Authorizing Provider  enoxaparin (LOVENOX) 60 MG/0.6ML injection Inject 0.6 mLs (60 mg total) into the skin daily. 04/28/18 06/09/18  Edwinna Areola, DO  ibuprofen (ADVIL,MOTRIN) 600 MG tablet Take 1 tablet (600 mg total) by mouth every 6 (six) hours as needed for moderate pain or cramping. 04/27/18   Banga, Cecilia Worema, DO  IRON PO Take 1 tablet by mouth every evening.     [provider]  labetalol (NORMODYNE) 200 MG tablet Take 1 tablet (200 mg total) by mouth 2 (two) times daily for 20 days. 04/27/18 05/17/18  Banga, Mare Loan Worema, DO  Prenat-FePoly-Metf-FA-DHA-DSS (VITAFOL FE+) 90-1-200 & 50 MG CPPK Take 1 capsule by mouth daily.  01/13/18   [provider]    Family History Family History  Problem  Relation Age of Onset  . Asthma Mother   . Heart disease Mother   . Hypertension Mother   . Stroke Mother   . Cancer Father        prostate  . Alcohol abuse Neg Hx   . Arthritis Neg Hx   . Birth defects Neg Hx   . COPD Neg Hx   . Depression Neg Hx   . Diabetes Neg Hx   . Drug abuse Neg Hx   . Early death Neg Hx   . Hearing loss Neg Hx   . Hyperlipidemia Neg Hx   . Kidney disease Neg Hx   . Learning disabilities Neg Hx   . Mental illness Neg Hx   . Mental retardation Neg Hx   . Miscarriages / Stillbirths Neg Hx   . Vision loss Neg Hx   . Varicose Veins Neg Hx     Social History Social History   Tobacco Use  . Smoking status: Never Smoker  . Smokeless tobacco: Never Used  Substance Use Topics  . Alcohol use: No  . Drug use: No     Allergies   Penicillins   Review of Systems Review of Systems  Respiratory: Positive for shortness of breath.   Cardiovascular: Positive for chest pain.  All other systems reviewed and are negative.    Physical Exam Updated Vital Signs BP (!) 145/98   Pulse 78   Temp 98.3 F (36.8 C) (Oral)   Resp 19   LMP 08/09/2017   SpO2 100%   Physical Exam Vitals signs and nursing note reviewed.  Constitutional:      Appearance: She is well-developed.  HENT:     Head: Normocephalic and atraumatic.  Eyes:     Conjunctiva/sclera: Conjunctivae normal.     Pupils: Pupils are equal, round, and reactive to light.  Neck:     Musculoskeletal: Normal range of motion.  Cardiovascular:     Rate and Rhythm: Normal rate and regular rhythm.     Heart sounds: Normal heart sounds.  Pulmonary:     Effort: Pulmonary effort is normal.     Breath sounds: Normal breath sounds. No decreased breath sounds, wheezing or rhonchi.     Comments: Lungs clear without any wheezes or rhonchi, no audible rails, no distress, able speak in full sentences without difficulty Abdominal:     General: Bowel sounds are normal.     Palpations: Abdomen is soft.    Musculoskeletal: Normal range of motion.     Comments: 1+ edema at the ankles, no significant calf asymmetry, tenderness, or palpable cords, negative Homans sign  Skin:    General: Skin is warm and dry.  Neurological:     Mental Status:  She is alert and oriented to person, place, and time.      ED Treatments / Results  Labs (all labs ordered are listed, but only abnormal results are displayed) Labs Reviewed  BASIC METABOLIC PANEL - Abnormal; Notable for the following components:      Result Value   CO2 20 (*)    All other components within normal limits  CBC - Abnormal; Notable for the following components:   RBC 3.66 (*)    Hemoglobin 11.1 (*)    HCT 34.2 (*)    All other components within normal limits  I-STAT BETA HCG BLOOD, ED ([facility], [facility], [facility] ONLY) - Abnormal; Notable for the following components:   I-stat hCG, quantitative 1,614.9 (*)    All other components within normal limits  URINALYSIS, ROUTINE W REFLEX MICROSCOPIC  HEPATIC FUNCTION PANEL  BRAIN NATRIURETIC PEPTIDE  I-STAT TROPONIN, ED    EKG EKG Interpretation  Date/Time:  Monday April 28 2018 19:43:58 EST Ventricular Rate:  73 PR Interval:  146 QRS Duration: 86 QT Interval:  360 QTC Calculation: 396 R Axis:   61 Text Interpretation:  Normal sinus rhythm with sinus arrhythmia Possible Anterior infarct , age undetermined Abnormal ECG No significant change since last tracing Confirmed by Rochele Raring [phone]) on 04/29/2018 12:08:53 AM   Radiology Dg Chest 2 View  Result Date: 04/28/2018 CLINICAL DATA:  Continued gestational hypertension following birth 5 days ago. EXAM: CHEST - 2 VIEW COMPARISON:  03/27/2018. FINDINGS: Mild enlargement of the cardiac silhouette with a mild increase in size. Mild increase in prominence of the pulmonary vasculature. Clear lungs. No pleural fluid. Unremarkable bones. IMPRESSION: Mildly progressive mild cardiomegaly and pulmonary vascular congestion. Electronically Signed   By: Beckie Salts M.D.   On: 04/28/2018 20:42    Procedures Procedures (including critical care time)  Medications Ordered in ED Medications  sodium chloride flush (NS) 0.9 % injection 3 mL (has no administration in time range)  iopamidol (ISOVUE-370) 76 % injection (has no administration in time range)     Initial Impression / Assessment and Plan / ED Course  I have reviewed the triage vital signs and the nursing notes.  Pertinent labs & imaging results that were available during my care of the patient were reviewed by me and considered in my medical decision making (see chart for details).  38 y.o. F here with chest pain.  She is 5 days s/p SVD, no complications.  Also has history of HTN and PE, on labetalol and lovenox, reports compliance.  EKG here is non-ischemic.  Trop negative.   Basic labs reassuring.  CXR with cardiomegaly and possible vascular congestion.  Given recent delivery, cardiomyopathy, PE, postpartum preeclampsia on differential.  Will add on LFT's, BNP, and UA.  Will also obtain CTA of the chest.  LFT's reassuring.  UA without noted protein.  BNP WNL.  CTA negative for PE or findings of vascular congestion.  Patient's blood pressure has remained at her baseline here which is usually 140s over 90s.  Given reassuring work-up, I do feel she is stable for discharge home.  States she was supposed to have appointment with her cardiologist last week but delivered a little early, will reschedule this soon.  I have given copies of her labs and imaging studies from today's visit for physician review.  Recommended keeping a close eye on her blood pressure at home, checking no more than twice daily, continue home meds.  She can return here for any new or acute  changes.  Final Clinical Impressions(s) / ED Diagnoses   Final diagnoses:  Chest pain in adult    ED Discharge Orders    None       Garlon Hatchet, PA-C 04/29/18 0423    Ward, Layla Maw, DO 04/29/18 [phone]

## 2018-04-29 NOTE — Discharge Instructions (Addendum)
Work-up today looked good. Continue your home medications.  Check BP no more than twice daily-- try to check around same time(s) each day. Follow-up with your cardiologist-- have them review labs and imaging studies. Return here for any new/acute changes.

## 2018-06-03 ENCOUNTER — Ambulatory Visit (HOSPITAL_COMMUNITY)
Admission: EM | Admit: 2018-06-03 | Discharge: 2018-06-03 | Disposition: A | Discharge status: Home / Self Care | Payer: 59

## 2018-06-03 ENCOUNTER — Other Ambulatory Visit: Payer: Self-pay

## 2018-06-03 ENCOUNTER — Encounter (HOSPITAL_COMMUNITY): Payer: Self-pay

## 2018-06-03 DIAGNOSIS — R42 Dizziness and giddiness: Secondary | ICD-10-CM

## 2018-06-03 LAB — GLUCOSE, CAPILLARY: Glucose-Capillary: 109 mg/dL — ABNORMAL HIGH (ref 70–99)

## 2018-06-03 NOTE — ED Triage Notes (Signed)
Intermittent dizziness for about a week,

## 2018-06-03 NOTE — Discharge Instructions (Signed)
Your EKG and glucose are normal. Please start to journal when you feel the dizziness, so this will help your provider understand where this is coming from. If you get worse, please go to ER, where you will be able to get labs done if needed.

## 2018-06-03 NOTE — ED Provider Notes (Signed)
[facility]    CSN: [id number] Arrival date & time: 06/03/18  1745     History   Chief Complaint Chief Complaint  Patient presents with  . Dizziness    HPI Antoine PocheValiencia Allende is a 38 y.o. female.   Pt has been having intermittent light headness, not related to position, but today it got worse and was worried about her blood sugar. This afternoon when she sat down and getting ready to feed her daughter she felt light headed and she got up to get her husband and the dizziness did not get worse, but better and has completely resolved. She had him bring her here, but her symptoms had resolved. She denies sweating, SOB or CP. She had eaten hot dog with a bun, pistachos and sweet green tea 30 min before the episode. Last labs 04/25/18 For the past 3 h her lips and L hand felt tingling.  Has been seeing a nephrologist for post partum proteinuria and HTN, her BP med was changed to losartin  50 from labetolol 200 mg and she started first dose today.  BP's at home today 118/72, the past readings have been upper 120's/80's. Few times had low BP 90/60 2 weeks ago.       Past Medical History:  Diagnosis Date  . AMA (advanced maternal age) multigravida 35+   . Breast feeding status of mother   . GERD (gastroesophageal reflux disease)   . Headache   . Hx of blood clots   . Hx of varicella   . Hyperlipemia   . Hypertension   . Pregnancy induced hypertension   . Pulmonary embolism [facility](HCC)     Patient Active Problem List   Diagnosis Date Noted  . Term pregnancy 04/25/2018  . Vaginal delivery 04/25/2018  . Adherent placenta 04/25/2018  . Missed abortion with fetal demise before 20 completed weeks of gestation 05/03/2017  . Precipitous delivery 02/19/2015  . Postpartum care following vaginal delivery 02/19/2015  . Active labor at term 02/18/2015  . Pre-diabetes 05/14/2014  . Elevated LDL cholesterol level 05/14/2014  . Pulmonary embolism (HCC) 10/17/2013  . Labor and delivery  indication for care or intervention 10/09/2013  . Spontaneous abortion in second trimester 10/09/2013  . SVD (spontaneous vaginal delivery) 03/02/2013    Past Surgical History:  Procedure Laterality Date  . CHOLECYSTECTOMY      OB History    Gravida  9   Para  8   Term  7   Preterm  1   AB  1   Living  7     SAB  1   TAB      Ectopic      Multiple  0   Live Births  8            Home Medications    Prior to Admission medications   Medication Sig Start Date End Date Taking? Authorizing Provider  losartan (COZAAR) 50 MG tablet Take 50 mg by mouth daily. 06/03/18  Yes [provider]  enoxaparin (LOVENOX) 60 MG/0.6ML injection Inject 0.6 mLs (60 mg total) into the skin daily. 04/28/18 06/09/18  Edwinna AreolaBanga, Cecilia Worema, DO  ibuprofen (ADVIL,MOTRIN) 600 MG tablet Take 1 tablet (600 mg total) by mouth every 6 (six) hours as needed for moderate pain or cramping. 04/27/18   Banga, Cecilia Worema, DO  IRON PO Take 1 tablet by mouth every evening.     [provider]  labetalol (NORMODYNE) 200 MG tablet Take 1 tablet (200  mg total) by mouth 2 (two) times daily for 20 days. 04/27/18 05/17/18  Banga, Mare Loan Worema, DO  Prenat-FePoly-Metf-FA-DHA-DSS (VITAFOL FE+) 90-1-200 & 50 MG CPPK Take 1 capsule by mouth daily.  01/13/18   [provider]    Family History Family History  Problem Relation Age of Onset  . Asthma Mother   . Heart disease Mother   . Hypertension Mother   . Stroke Mother   . Cancer Father        prostate  . Alcohol abuse Neg Hx   . Arthritis Neg Hx   . Birth defects Neg Hx   . COPD Neg Hx   . Depression Neg Hx   . Diabetes Neg Hx   . Drug abuse Neg Hx   . Early death Neg Hx   . Hearing loss Neg Hx   . Hyperlipidemia Neg Hx   . Kidney disease Neg Hx   . Learning disabilities Neg Hx   . Mental illness Neg Hx   . Mental retardation Neg Hx   . Miscarriages / Stillbirths Neg Hx   . Vision loss Neg Hx   . Varicose Veins Neg Hx      Social History Social History   Tobacco Use  . Smoking status: Never Smoker  . Smokeless tobacco: Never Used  Substance Use Topics  . Alcohol use: No  . Drug use: No     Allergies   Penicillins   Review of Systems Review of Systems  Constitutional: Negative for chills, diaphoresis, fatigue and fever.  HENT: Positive for sneezing. Negative for congestion, ear discharge, ear pain, postnasal drip, rhinorrhea, sore throat, tinnitus and trouble swallowing.   Eyes: Negative for visual disturbance.  Respiratory: Negative for cough and shortness of breath.   Cardiovascular: Positive for palpitations. Negative for chest pain.       Felt mild palpitations a couple of times during the episode.   Gastrointestinal: Negative for abdominal pain, diarrhea, nausea and vomiting.  Endocrine: Negative for polydipsia and polyphagia.  Genitourinary: Negative for dysuria, frequency and urgency.  Musculoskeletal: Negative for myalgias.  Skin: Negative for rash.  Neurological: Positive for light-headedness. Negative for dizziness, tremors, syncope, facial asymmetry, weakness, numbness and headaches.       Had HA up to 2 days ago, but resolved.   Hematological: Negative for adenopathy.     Physical Exam Triage Vital Signs ED Triage Vitals  Enc Vitals Group     BP 06/03/18 1803 (!) 137/95     Pulse Rate 06/03/18 1803 79     Resp 06/03/18 1803 18     Temp 06/03/18 1803 98 F (36.7 C)     Temp src --      SpO2 06/03/18 1803 100 %     Weight --      Height --      Head Circumference --      Peak Flow --      Pain Score 06/03/18 1804 0     Pain Loc --      Pain Edu? --      Excl. in GC? --    No data found.  Updated Vital Signs BP (!) 137/95   Pulse 79   Temp 98 F (36.7 C)   Resp 18   LMP 06/02/2018   SpO2 100%   Visual Acuity Right Eye Distance:   Left Eye Distance:   Bilateral Distance:    Right Eye Near:   Left Eye Near:    Bilateral  Near:      Physical Exam  Vitals signs and nursing note reviewed.  Constitutional:      General: He is not in acute distress.    Appearance: He is well-developed and normal weight. He is not ill-appearing, toxic-appearing or diaphoretic.  HENT:     Head: Normocephalic.  Eyes:     Extraocular Movements: Extraocular movements intact.     Pupils: Pupils are equal, round, and reactive to light.  Neck:     Musculoskeletal: Neck supple. No neck rigidity.     Meningeal: Brudzinski's sign absent.  Cardiovascular:     Rate and Rhythm: Normal rate and regular rhythm.     Heart sounds: No murmur.  Pulmonary:     Effort: Pulmonary effort is normal.     Breath sounds: Normal breath sounds. No wheezing, rhonchi or rales.  Abdominal:     General: Bowel sounds are normal.     Palpations: Abdomen is soft. There is no mass.     Tenderness: There is no abdominal tenderness. There is no guarding.  Musculoskeletal: Normal range of motion.  Lymphadenopathy:     Cervical: No cervical adenopathy.  Skin:    General: Skin is warm and dry.  Neurological:     Mental Status: He is alert.     Cranial Nerves: No cranial nerve deficit or facial asymmetry.     Sensory: No sensory deficit.     Motor: No weakness.     Coordination: Romberg sign negative. Coordination normal.     Gait: Gait normal.     Deep Tendon Reflexes: Reflexes normal.     Comments: Normal Romberg, propioception, finger to nose, tandem gait.   Psychiatric:        Mood and Affect: Mood normal.        Speech: Speech normal.        Behavior: Behavior normal.     UC Treatments / Results  Labs (all labs ordered are listed, but only abnormal results are displayed) Labs Reviewed - No data to display  EKG NSR  Radiology No results found.  Procedures  Medications Ordered in UC Medications - No data to display  Initial Impression / Assessment and Plan / UC Course  I have reviewed the triage vital signs and the nursing notes. Pertinent labs results that  were available during my care of the patient were reviewed by me and considered in my medical decision making (see chart for details). I explained to pt her post prandial glucose is normal and EKG is normal, and I don't know what caused her to feel light headed at that time she sat down. I did ask her to make a journal if symptoms come back, like if after eating, or not eating, changing positions or turning head, amount of fluids drank that day, etc. She agrees.    Final Clinical Impressions(s) / UC Diagnoses   Final diagnoses:  None   Discharge Instructions   None    ED Prescriptions    None     Controlled Substance Prescriptions [place] Controlled Substance Registry consulted?    Garey Ham, [place] 06/03/18 1858

## 2018-07-05 ENCOUNTER — Emergency Department (HOSPITAL_COMMUNITY): Payer: 59

## 2018-07-05 ENCOUNTER — Emergency Department (HOSPITAL_COMMUNITY)
Admission: EM | Admit: 2018-07-05 | Discharge: 2018-07-06 | Disposition: A | Discharge status: Home / Self Care | Payer: 59 | Attending: Emergency Medicine | Admitting: Emergency Medicine

## 2018-07-05 ENCOUNTER — Encounter (HOSPITAL_COMMUNITY): Payer: Self-pay | Admitting: Emergency Medicine

## 2018-07-05 ENCOUNTER — Other Ambulatory Visit: Payer: Self-pay

## 2018-07-05 DIAGNOSIS — R079 Chest pain, unspecified: Secondary | ICD-10-CM | POA: Diagnosis present

## 2018-07-05 DIAGNOSIS — I10 Essential (primary) hypertension: Secondary | ICD-10-CM | POA: Diagnosis not present

## 2018-07-05 DIAGNOSIS — Z79899 Other long term (current) drug therapy: Secondary | ICD-10-CM | POA: Diagnosis not present

## 2018-07-05 DIAGNOSIS — R072 Precordial pain: Secondary | ICD-10-CM | POA: Diagnosis not present

## 2018-07-05 LAB — CBC
HCT: 39.4 % (ref 36.0–46.0)
Hemoglobin: 12.9 g/dL (ref 12.0–15.0)
MCH: 30.4 pg (ref 26.0–34.0)
MCHC: 32.7 g/dL (ref 30.0–36.0)
MCV: 92.7 fL (ref 80.0–100.0)
Platelets: 269 10*3/uL (ref 150–400)
RBC: 4.25 MIL/uL (ref 3.87–5.11)
RDW: 13.5 % (ref 11.5–15.5)
WBC: 6.7 10*3/uL (ref 4.0–10.5)
nRBC: 0 % (ref 0.0–0.2)

## 2018-07-05 LAB — TROPONIN I: Troponin I: <0.03 ng/mL (ref <0.03)

## 2018-07-05 LAB — BASIC METABOLIC PANEL
Anion gap: 9 (ref 5–15)
BUN: 14 mg/dL (ref 6–20)
CO2: 22 mmol/L (ref 22–32)
Calcium: 8.9 mg/dL (ref 8.9–10.3)
Chloride: 104 mmol/L (ref 98–111)
Creatinine, Ser: 0.62 mg/dL (ref 0.44–1.00)
GFR calc Af Amer: >60 mL/min (ref >60)
GFR calc non Af Amer: >60 mL/min (ref >60)
Glucose, Bld: 80 mg/dL (ref 70–99)
Potassium: 3.8 mmol/L (ref 3.5–5.1)
Sodium: 135 mmol/L (ref 135–145)

## 2018-07-05 LAB — I-STAT BETA HCG BLOOD, ED (MC, WL, AP ONLY): I-stat hCG, quantitative: <5 m[IU]/mL (ref <5)

## 2018-07-05 MED ORDER — LIDOCAINE VISCOUS HCL 2 % MT SOLN
15.0000 mL | Freq: Once | OROMUCOSAL | Status: AC
  Administered 2018-07-05: 15 mL via ORAL
  Filled 2018-07-05: qty 15

## 2018-07-05 MED ORDER — SODIUM CHLORIDE 0.9% FLUSH: 3.0000 mL | Freq: Once | INTRAVENOUS | Status: DC

## 2018-07-05 MED ORDER — ALUM & MAG HYDROXIDE-SIMETH 200-200-20 MG/5ML PO SUSP
30.0000 mL | Freq: Once | ORAL | Status: AC
  Administered 2018-07-05: 23:00:00 30 mL via ORAL
  Filled 2018-07-05: qty 30

## 2018-07-05 MED ORDER — OMEPRAZOLE 20 MG PO CPDR: 20.0000 mg | DELAYED_RELEASE_CAPSULE | Freq: Every day | ORAL | 0 refills | Status: DC

## 2018-07-05 NOTE — ED Provider Notes (Signed)
[facility] EMERGENCY DEPARTMENT Provider Note   CSN: [id number] Arrival date & time: 07/05/18  2135    History   Chief Complaint Chief Complaint  Patient presents with  . Chest Pain    HPI Mindy Wade is a 38 y.o. female.     Patient with history of pulmonary embolism during pregnancy in 2015, GERD -- presents to the emergency department today with complaint of chest pain.  Patient has frequent episodes of chest pain.  Today's episode started when she awoke at 9 AM.  She describes the pain in the mid chest and describes it as an aching and burning sensation.  She does not have any shortness of breath.  Pain does not radiate.  She has not had any associated diaphoresis or vomiting.  Patient took some Tylenol this afternoon which seemed to help temporarily.  She is unsure if eating makes it better or worse.  She denies any fevers or cough.  She feels that this is different than her previous pulmonary embolism.  She was on prophylactic anticoagulation, Lovenox, during recent pregnancy however has been off of this for several months now.  Patient denies risk factors for pulmonary embolism including: unilateral leg swelling, use of exogenous hormones, recent immobilizations, recent surgery, recent travel (>4hr segment), malignancy, hemoptysis.  Pain feels different to the patient depending on her position.  She does have a history of hypertension and high cholesterol.  No diabetes, smoking.  Patient's mother had history of stroke and heart attack, especially at a younger age.  Patient reports having stress tests in the past which have been normal.      Past Medical History:  Diagnosis Date  . AMA (advanced maternal age) multigravida 35+   . Breast feeding status of mother   . GERD (gastroesophageal reflux disease)   . Headache   . Hx of blood clots   . Hx of varicella   . Hyperlipemia   . Hypertension   . Pregnancy induced hypertension   . Pulmonary embolism  [facility])     Patient Active Problem List   Diagnosis Date Noted  . Term pregnancy 04/25/2018  . Vaginal delivery 04/25/2018  . Adherent placenta 04/25/2018  . Missed abortion with fetal demise before 20 completed weeks of gestation 05/03/2017  . Precipitous delivery 02/19/2015  . Postpartum care following vaginal delivery 02/19/2015  . Active labor at term 02/18/2015  . Pre-diabetes 05/14/2014  . Elevated LDL cholesterol level 05/14/2014  . Pulmonary embolism (HCC) 10/17/2013  . Labor and delivery indication for care or intervention 10/09/2013  . Spontaneous abortion in second trimester 10/09/2013  . SVD (spontaneous vaginal delivery) 03/02/2013    Past Surgical History:  Procedure Laterality Date  . CHOLECYSTECTOMY       OB History    Gravida  9   Para  8   Term  7   Preterm  1   AB  1   Living  7     SAB  1   TAB      Ectopic      Multiple  0   Live Births  8            Home Medications    Prior to Admission medications   Medication Sig Start Date End Date Taking? Authorizing Provider  enoxaparin (LOVENOX) 60 MG/0.6ML injection Inject 0.6 mLs (60 mg total) into the skin daily. 04/28/18 07/05/18  Edwinna Areola, DO  ibuprofen (ADVIL,MOTRIN) 600 MG tablet Take 1 tablet (600  mg total) by mouth every 6 (six) hours as needed for moderate pain or cramping. 04/27/18   Banga, Cecilia Worema, DO  IRON PO Take 1 tablet by mouth every evening.     [provider]  losartan (COZAAR) 50 MG tablet Take 50 mg by mouth daily. 06/03/18   [provider]  Prenat-FePoly-Metf-FA-DHA-DSS (VITAFOL FE+) 90-1-200 & 50 MG CPPK Take 1 capsule by mouth daily.  01/13/18   [provider]    Family History Family History  Problem Relation Age of Onset  . Asthma Mother   . Heart disease Mother   . Hypertension Mother   . Stroke Mother   . Cancer Father        prostate  . Alcohol abuse Neg Hx   . Arthritis Neg Hx   . Birth defects Neg Hx   .  COPD Neg Hx   . Depression Neg Hx   . Diabetes Neg Hx   . Drug abuse Neg Hx   . Early death Neg Hx   . Hearing loss Neg Hx   . Hyperlipidemia Neg Hx   . Kidney disease Neg Hx   . Learning disabilities Neg Hx   . Mental illness Neg Hx   . Mental retardation Neg Hx   . Miscarriages / Stillbirths Neg Hx   . Vision loss Neg Hx   . Varicose Veins Neg Hx     Social History Social History   Tobacco Use  . Smoking status: Never Smoker  . Smokeless tobacco: Never Used  Substance Use Topics  . Alcohol use: No  . Drug use: No     Allergies   Penicillins   Review of Systems Review of Systems  Constitutional: Negative for diaphoresis and fever.  Eyes: Negative for redness.  Respiratory: Negative for cough and shortness of breath.   Cardiovascular: Positive for chest pain. Negative for palpitations and leg swelling.  Gastrointestinal: Negative for abdominal pain, nausea and vomiting.  Genitourinary: Negative for dysuria.  Musculoskeletal: Negative for back pain and neck pain.  Skin: Negative for rash.  Neurological: Negative for syncope and light-headedness.  Psychiatric/Behavioral: The patient is not nervous/anxious.      Physical Exam Updated Vital Signs BP (!) 149/97 (BP Location: Right Arm)   Pulse 80   Temp 98.1 F (36.7 C) (Oral)   Resp 18   Ht 5\' 8"  (1.727 m)   Wt 108.9 kg   LMP 08/09/2017   SpO2 100%   BMI 36.49 kg/m   Physical Exam Vitals signs and nursing note reviewed.  Constitutional:      Appearance: She is well-developed. She is not diaphoretic.  HENT:     Head: Normocephalic and atraumatic.     Mouth/Throat:     Mouth: Mucous membranes are not dry.  Eyes:     Conjunctiva/sclera: Conjunctivae normal.  Neck:     Musculoskeletal: Normal range of motion and neck supple. No muscular tenderness.     Vascular: Normal carotid pulses. No carotid bruit or JVD.     Trachea: Trachea normal. No tracheal deviation.  Cardiovascular:     Rate and Rhythm:  Normal rate and regular rhythm.     Pulses: No decreased pulses.     Heart sounds: Normal heart sounds, S1 normal and S2 normal. No murmur.  Pulmonary:     Effort: Pulmonary effort is normal. No respiratory distress.     Breath sounds: No wheezing.  Chest:     Chest wall: No tenderness.  Abdominal:  General: Bowel sounds are normal.     Palpations: Abdomen is soft.     Tenderness: There is no abdominal tenderness. There is no guarding or rebound.  Musculoskeletal: Normal range of motion.  Skin:    General: Skin is warm and dry.     Coloration: Skin is not pale.  Neurological:     Mental Status: She is alert.      ED Treatments / Results  Labs (all labs ordered are listed, but only abnormal results are displayed) Labs Reviewed  CBC  BASIC METABOLIC PANEL  TROPONIN I  I-STAT BETA HCG BLOOD, ED ([facility], [facility], [facility] ONLY)    EKG EKG Interpretation  Date/Time:  Saturday Jul 05 2018 21:41:54 EDT Ventricular Rate:  82 PR Interval:  150 QRS Duration: 92 QT Interval:  374 QTC Calculation: 436 R Axis:   55 Text Interpretation:  Normal sinus rhythm with sinus arrhythmia Cannot rule out Anterior infarct , age undetermined Abnormal ECG No significant change was found Confirmed by Kennis Carina [phone]) on 07/05/2018 10:15:59 PM   Radiology Dg Chest 2 View  Result Date: 07/05/2018 CLINICAL DATA:  Chest pain EXAM: CHEST - 2 VIEW COMPARISON:  04/29/2018 FINDINGS: The heart size and mediastinal contours are within normal limits. Both lungs are clear. The visualized skeletal structures are unremarkable. IMPRESSION: No active cardiopulmonary disease. Electronically Signed   By: Alcide Clever M.D.   On: 07/05/2018 22:26    Procedures Procedures (including critical care time)  Medications Ordered in ED Medications  sodium chloride flush (NS) 0.9 % injection 3 mL (has no administration in time range)     Initial Impression / Assessment and Plan / ED Course  I have reviewed the triage  vital signs and the nursing notes.  Pertinent labs & imaging results that were available during my care of the patient were reviewed by me and considered in my medical decision making (see chart for details).        Patient seen and examined.  Chest x-ray is negative.  Reviewed EKG which is unchanged.  Low concern for PE at this time given lack of hypoxia, shortness of breath, tachycardia, other clinical symptoms of DVT.  Patient also states that she feels different than previous PE.  Patient has had many chest CTs over the past several years.  Waiting result of troponin.  Will give GI cocktail in interim.  If patient's work-up is negative, feel that she is low risk for ACS and can follow-up with her cardiologist regarding need for any other evaluation or testing.  Vital signs reviewed and are as follows: BP (!) 149/97 (BP Location: Right Arm)   Pulse 80   Temp 98.1 F (36.7 C) (Oral)   Resp 18   Ht  (1.727 m)   Wt 108.9 kg   LMP 08/09/2017   SpO2 100%   BMI 36.49 kg/m   11:41 PM patient's work-up is completely normal.  Patient updated on results.  She did get partial relief with GI cocktail.  We discussed her results and need for close outpatient follow-up with her PCP/cardiologist.  She has not been on GERD medication as of recently.  Will restart omeprazole to see if this helps empirically.  Patient was counseled to return with severe chest pain, especially if the pain is crushing or pressure-like and spreads to the arms, back, neck, or jaw, or if they have sweating, nausea, or shortness of breath with the pain. They were encouraged to call 911 with these symptoms.  They were also told to return if their chest pain gets worse and does not go away with rest, they have an attack of chest pain lasting longer than usual despite rest and treatment with the medications their caregiver has prescribed, if they wake from sleep with chest pain or shortness of breath, if they feel dizzy or  faint, if they have chest pain not typical of their usual pain, or if they have any other emergent concerns regarding their health.  The patient verbalized understanding and agreed.    Final Clinical Impressions(s) / ED Diagnoses   Final diagnoses:  Precordial pain   Patient with history of PE during previous pregnancy --with chest pain.  Patient has had multiple visits in last year for similar symptoms.  Her work-up today is very reassuring.  Troponin is negative greater than 12 hours after onset of symptoms.  Pain is positional in nature.  She does not have any clinical signs of DVT, tachycardia, hypoxia and states that this pain does not feel like her previous PE.  She is not short of breath.  Cardiac work-up with unchanged EKG, negative troponin.  Features are atypical for ACS.  At this time feel comfortable with discharged home with follow-up with her doctors.  Return instructions as above.  ED Discharge Orders         Ordered    omeprazole (PRILOSEC) 20 MG capsule  Daily     07/05/18 2340           Renne Crigler, PA-C 07/05/18 2344    Sabas Sous, MD 07/06/18 [id number]

## 2018-07-05 NOTE — ED Triage Notes (Signed)
Pt reports centralized chest pain that started around 9am.  While this has happened in the past, the pain today is different in that is it constant. Denies any other symptoms other than some pain under her left arm

## 2018-07-05 NOTE — Discharge Instructions (Signed)
Please read and follow all provided instructions.  Your diagnoses today include:  1. Precordial pain     Tests performed today include:  An EKG of your heart  A chest x-ray  Cardiac enzymes - a blood test for heart muscle damage  Blood counts and electrolytes  Vital signs. See below for your results today.   Medications prescribed:   Omeprazole (Prilosec) - stomach acid reducer  This medication can be found over-the-counter  Take any prescribed medications only as directed.  Follow-up instructions: Please follow-up with your primary care provider as soon as you can for further evaluation of your symptoms.   Return instructions:  SEEK IMMEDIATE MEDICAL ATTENTION IF:  You have severe chest pain, especially if the pain is crushing or pressure-like and spreads to the arms, back, neck, or jaw, or if you have sweating, nausea (feeling sick to your stomach), or shortness of breath. THIS IS AN EMERGENCY. Don't wait to see if the pain will go away. Get medical help at once. Call 911 or 0 (operator). DO NOT drive yourself to the hospital.   Your chest pain gets worse and does not go away with rest.   You have an attack of chest pain lasting longer than usual, despite rest and treatment with the medications your caregiver has prescribed.   You wake from sleep with chest pain or shortness of breath.  You feel dizzy or faint.  You have chest pain not typical of your usual pain for which you originally saw your caregiver.   You have any other emergent concerns regarding your health.  Additional Information: Chest pain comes from many different causes. Your caregiver has diagnosed you as having chest pain that is not specific for one problem, but does not require admission.  You are at low risk for an acute heart condition or other serious illness.   Your vital signs today were: BP 132/81    Pulse (!) 55    Temp 98.1 F (36.7 C) (Oral)    Resp (!) 23    Ht 5\' 8"  (1.727 m)    Wt  108.9 kg    LMP 08/09/2017    SpO2 100%    BMI 36.49 kg/m  If your blood pressure (BP) was elevated above 135/85 this visit, please have this repeated by your doctor within one month. --------------

## 2018-09-21 ENCOUNTER — Other Ambulatory Visit: Payer: Self-pay

## 2018-09-21 ENCOUNTER — Encounter (HOSPITAL_COMMUNITY): Payer: Self-pay | Admitting: Emergency Medicine

## 2018-09-21 ENCOUNTER — Emergency Department (HOSPITAL_COMMUNITY)
Admission: EM | Admit: 2018-09-21 | Discharge: 2018-09-22 | Disposition: A | Discharge status: Home / Self Care | Payer: 59 | Attending: Emergency Medicine | Admitting: Emergency Medicine

## 2018-09-21 ENCOUNTER — Emergency Department (HOSPITAL_COMMUNITY): Payer: 59

## 2018-09-21 DIAGNOSIS — R0602 Shortness of breath: Secondary | ICD-10-CM | POA: Insufficient documentation

## 2018-09-21 DIAGNOSIS — Z3491 Encounter for supervision of normal pregnancy, unspecified, first trimester: Secondary | ICD-10-CM

## 2018-09-21 DIAGNOSIS — Z331 Pregnant state, incidental: Secondary | ICD-10-CM | POA: Diagnosis not present

## 2018-09-21 DIAGNOSIS — I10 Essential (primary) hypertension: Secondary | ICD-10-CM | POA: Diagnosis not present

## 2018-09-21 DIAGNOSIS — Z86711 Personal history of pulmonary embolism: Secondary | ICD-10-CM

## 2018-09-21 DIAGNOSIS — R0789 Other chest pain: Secondary | ICD-10-CM | POA: Diagnosis not present

## 2018-09-21 DIAGNOSIS — Z79899 Other long term (current) drug therapy: Secondary | ICD-10-CM | POA: Insufficient documentation

## 2018-09-21 MED ORDER — SODIUM CHLORIDE 0.9% FLUSH: 3.0000 mL | Freq: Once | INTRAVENOUS | Status: DC

## 2018-09-21 NOTE — ED Triage Notes (Signed)
Patient with chest pain that started yesterday, sharp in nature. Now it is achy in nature with heaviness feeling with some shortness of breath.  History of PE in 2017 after a miscarriage at 22 weeks. She did have a baby in February 2020, no concerns from then until yesterday.  No nausea or vomiting.

## 2018-09-22 ENCOUNTER — Ambulatory Visit (HOSPITAL_COMMUNITY): Admission: RE | Admit: 2018-09-22 | Payer: 59 | Source: Ambulatory Visit

## 2018-09-22 LAB — CBC
HCT: 37.9 % (ref 36.0–46.0)
Hemoglobin: 12.9 g/dL (ref 12.0–15.0)
MCH: 30.9 pg (ref 26.0–34.0)
MCHC: 34 g/dL (ref 30.0–36.0)
MCV: 90.9 fL (ref 80.0–100.0)
Platelets: 259 10*3/uL (ref 150–400)
RBC: 4.17 MIL/uL (ref 3.87–5.11)
RDW: 12.4 % (ref 11.5–15.5)
WBC: 4.8 10*3/uL (ref 4.0–10.5)
nRBC: 0 % (ref 0.0–0.2)

## 2018-09-22 LAB — BASIC METABOLIC PANEL
Anion gap: 10 (ref 5–15)
BUN: 14 mg/dL (ref 6–20)
CO2: 20 mmol/L — ABNORMAL LOW (ref 22–32)
Calcium: 8.9 mg/dL (ref 8.9–10.3)
Chloride: 104 mmol/L (ref 98–111)
Creatinine, Ser: 0.75 mg/dL (ref 0.44–1.00)
GFR calc Af Amer: >60 mL/min (ref >60)
GFR calc non Af Amer: >60 mL/min (ref >60)
Glucose, Bld: 99 mg/dL (ref 70–99)
Potassium: 3.5 mmol/L (ref 3.5–5.1)
Sodium: 134 mmol/L — ABNORMAL LOW (ref 135–145)

## 2018-09-22 LAB — I-STAT BETA HCG BLOOD, ED (MC, WL, AP ONLY): I-stat hCG, quantitative: >2000 m[IU]/mL — ABNORMAL HIGH (ref <5)

## 2018-09-22 LAB — TROPONIN I (HIGH SENSITIVITY)
Troponin I (High Sensitivity): 7 ng/L (ref <18)
Troponin I (High Sensitivity): 5 ng/L (ref <18)

## 2018-09-22 LAB — HCG, QUANTITATIVE, PREGNANCY: hCG, Beta Chain, Quant, S: 134331 m[IU]/mL — ABNORMAL HIGH (ref <5)

## 2018-09-22 MED ORDER — ENOXAPARIN SODIUM 150 MG/ML ~~LOC~~ SOLN: 1.0000 mg/kg | SUBCUTANEOUS | 1 refills | Status: DC

## 2018-09-22 MED ORDER — ENOXAPARIN SODIUM 120 MG/0.8ML ~~LOC~~ SOLN
1.0000 mg/kg | Freq: Once | SUBCUTANEOUS | Status: AC
  Administered 2018-09-22: 115 mg via SUBCUTANEOUS
  Filled 2018-09-22: qty 0.75

## 2018-09-22 NOTE — ED Provider Notes (Signed)
[facility] Provider Note   CSN: [id number] Arrival date & time: 09/21/18  2259    History   Chief Complaint Chief Complaint  Patient presents with  . Chest Pain  . Shortness of Breath    HPI Antoine PocheValiencia Baba is a 38 y.o. female.     38 y/o [id number] female with hx of HTN, HLD, PE in the setting of pregnancy/recent delivery presents to the ED today for c/o chest pain.  Patient describes a "heaviness" in her central chest which began while she was at work today.  It continued to persist and remained constant and became associated with some mild shortness of breath prompting her visit to the emergency department.  She denies taking any medications for her symptoms.  Pain did not radiate to her back, neck, jaw and was not associated with any diaphoresis, lightheadedness, syncope, hemoptysis.  She has not had any recent leg swelling or associated fevers.  Further denies abdominal pain, vaginal bleeding, vaginal discharge.  Patient is presently asymptomatic.  She expresses concern for possible blood clot given history of this in 2015.  Had a negative CT PE study in March 2020 following SVD in February.  Has been on 40mg  SQ Lovenox during all of there pregnancies since PE dx in 2015.  LMP 08/11/2018.   OBGYN - Dr. Mindi SlickerBanga [facility])  The history is provided by the patient. No language interpreter was used.  Chest Pain Associated symptoms: shortness of breath   Shortness of Breath Associated symptoms: chest pain     Past Medical History:  Diagnosis Date  . AMA (advanced maternal age) multigravida 35+   . Breast feeding status of mother   . GERD (gastroesophageal reflux disease)   . Headache   . Hx of blood clots   . Hx of varicella   . Hyperlipemia   . Hypertension   . Pregnancy induced hypertension   . Pulmonary embolism [facility](HCC)     Patient Active Problem List   Diagnosis Date Noted  . Term pregnancy 04/25/2018  . Vaginal delivery  04/25/2018  . Adherent placenta 04/25/2018  . Missed abortion with fetal demise before 20 completed weeks of gestation 05/03/2017  . Precipitous delivery 02/19/2015  . Postpartum care following vaginal delivery 02/19/2015  . Active labor at term 02/18/2015  . Pre-diabetes 05/14/2014  . Elevated LDL cholesterol level 05/14/2014  . Pulmonary embolism (HCC) 10/17/2013  . Labor and delivery indication for care or intervention 10/09/2013  . Spontaneous abortion in second trimester 10/09/2013  . SVD (spontaneous vaginal delivery) 03/02/2013    Past Surgical History:  Procedure Laterality Date  . CHOLECYSTECTOMY       OB History    Gravida  9   Para  8   Term  7   Preterm  1   AB  1   Living  7     SAB  1   TAB      Ectopic      Multiple  0   Live Births  8            Home Medications    Prior to Admission medications   Medication Sig Start Date End Date Taking? Authorizing Provider  losartan (COZAAR) 50 MG tablet Take 50 mg by mouth daily. 06/03/18  Yes [provider]  enoxaparin (LOVENOX) 150 MG/ML injection Inject 0.73 mLs (110 mg total) into the skin daily. 09/22/18 10/22/18  Antony MaduraHumes, Makel Mcmann, PA-C  omeprazole (PRILOSEC) 20 MG capsule Take  1 capsule (20 mg total) by mouth daily. Patient not taking: Reported on 09/22/2018 07/05/18   Carlisle Cater, PA-C    Family History Family History  Problem Relation Age of Onset  . Asthma Mother   . Heart disease Mother   . Hypertension Mother   . Stroke Mother   . Cancer Father        prostate  . Alcohol abuse Neg Hx   . Arthritis Neg Hx   . Birth defects Neg Hx   . COPD Neg Hx   . Depression Neg Hx   . Diabetes Neg Hx   . Drug abuse Neg Hx   . Early death Neg Hx   . Hearing loss Neg Hx   . Hyperlipidemia Neg Hx   . Kidney disease Neg Hx   . Learning disabilities Neg Hx   . Mental illness Neg Hx   . Mental retardation Neg Hx   . Miscarriages / Stillbirths Neg Hx   . Vision loss Neg Hx   . Varicose  Veins Neg Hx     Social History Social History   Tobacco Use  . Smoking status: Never Smoker  . Smokeless tobacco: Never Used  Substance Use Topics  . Alcohol use: No  . Drug use: No     Allergies   Penicillins   Review of Systems Review of Systems  Respiratory: Positive for shortness of breath.   Cardiovascular: Positive for chest pain.  Ten systems reviewed and are negative for acute change, except as noted in the HPI.    Physical Exam Updated Vital Signs BP 112/72   Pulse 81   Temp 98.4 F (36.9 C) (Oral)   Resp 19   Wt 108.9 kg   LMP 08/11/2018 (Approximate)   SpO2 98%   Breastfeeding No   BMI 36.49 kg/m   Physical Exam Vitals signs and nursing note reviewed.  Constitutional:      General: She is not in acute distress.    Appearance: She is well-developed. She is not diaphoretic.     Comments: Nontoxic appearing and in NAD  HENT:     Head: Normocephalic and atraumatic.  Eyes:     General: No scleral icterus.    Conjunctiva/sclera: Conjunctivae normal.  Neck:     Musculoskeletal: Normal range of motion.  Cardiovascular:     Rate and Rhythm: Normal rate and regular rhythm.     Pulses: Normal pulses.     Comments: Not tachycardic as noted in triage Pulmonary:     Effort: Pulmonary effort is normal. No respiratory distress.     Breath sounds: No stridor. No wheezing, rhonchi or rales.     Comments: Lung sounds clear bilaterally.  Respirations even and unlabored. Musculoskeletal: Normal range of motion.     Comments: No unilateral edema.  Skin:    General: Skin is warm and dry.     Coloration: Skin is not pale.     Findings: No erythema or rash.  Neurological:     General: No focal deficit present.     Mental Status: She is alert and oriented to person, place, and time.     Coordination: Coordination normal.  Psychiatric:        Behavior: Behavior normal.      ED Treatments / Results  Labs (all labs ordered are listed, but only abnormal  results are displayed) Labs Reviewed  BASIC METABOLIC PANEL - Abnormal; Notable for the following components:      Result Value  Sodium 134 (*)    CO2 20 (*)    All other components within normal limits  HCG, QUANTITATIVE, PREGNANCY - Abnormal; Notable for the following components:   hCG, Beta Chain, Quant, S 134,331 (*)    All other components within normal limits  I-STAT BETA HCG BLOOD, ED ([facility], [facility], [facility] ONLY) - Abnormal; Notable for the following components:   I-stat hCG, quantitative >2,000.0 (*)    All other components within normal limits  CBC  TROPONIN I (HIGH SENSITIVITY)  TROPONIN I (HIGH SENSITIVITY)    EKG EKG Interpretation  Date/Time:  Sunday September 21 2018 23:04:13 EDT Ventricular Rate:  103 PR Interval:  152 QRS Duration: 80 QT Interval:  328 QTC Calculation: 429 R Axis:   55 Text Interpretation:  Sinus tachycardia Cannot rule out Anterior infarct , age undetermined Abnormal ECG Otherwise no significant change Confirmed by Cardama, Pedro ([id number]) on 09/22/2018 2:36:37 AM   Radiology Dg Chest 2 View  Result Date: 09/21/2018 CLINICAL DATA:  Chest pain EXAM: CHEST - 2 VIEW COMPARISON:  07/05/2018 FINDINGS: The heart size and mediastinal contours are within normal limits. Both lungs are clear. The visualized skeletal structures are unremarkable. IMPRESSION: No active cardiopulmonary disease. Electronically Signed   By: Kim  Fujinaga M.D.   On: 09/21/2018 23:48    Procedures Procedures (including critical care time)  Medications Ordered in ED Medications  sodium chloride flush (NS) 0.9 % injection 3 mL (has no administration in time range)  enoxaparin (LOVENOX) injection 115 mg (115 mg Subcutaneous Given 09/22/18 0425)    3:49 AM Spoke with Dr. Richardson on-call for [facility].  Dr. Richardson is comfortable starting the patient back on Lovenox.  Recommends dosing of 1 mg/kg given history of acute PE in the setting of pregnancy.  Patient can follow-up in  their office for continued prenatal care.   Initial Impression / Assessment and Plan / ED Course  I have reviewed the triage vital signs and the nursing notes.  Pertinent labs & imaging results that were available during my care of the patient were reviewed by me and considered in my medical decision making (see chart for details).        38  year old female presents to the emergency department for evaluation of chest heaviness with associated dyspnea.  Dyspnea was mild and both symptoms have resolved spontaneously since arrival to the emergency department.  Cardiac work-up reassuring today.  Patient expresses concern for PE given history of pulmonary embolus following pregnancy in 2015.  She has no sustained tachycardia or tachypnea.  No hypoxia while in the emergency department.  Denies any pleuritic discomfort.  No hemoptysis, leg swelling, syncope or near syncope.  Clinically, I feel the patient's risk for PE is low at this time.  She has had at least 6 CT angiograms since her initial PE diagnosis, all of which have been negative.  Most recent negative CTA was in March 2020.  The patient, however, was found to be pregnant today.  LMP 08/11/2018.  I discussed the case with Dr. Senaida [facility]ardson [facility].  She agrees with initiation of Lovenox at 1 mg/kg.  Plan to also coordinate outpatient DVT study.  If this is positive, dose of Lovenox will likely need to be augmented to a higher dosage for treatment, rather than prophylaxis, of PE/VTE.  Patient has been encouraged to follow-up with her OB/GYN for ongoing prenatal care.  Return precautions discussed and provided. Patient discharged in stable condition with no unaddressed concerns.  Vitals:   09/22/18 0205 09/22/18 0215 09/22/18 0355 09/22/18 0400  BP: 122/79 111/70  112/72  Pulse: 85 85  81  Resp: 16 (!) 25  19  Temp:      TempSrc:      SpO2: 100% 100%  98%  Weight:   108.9 kg     Final Clinical Impressions(s) / ED  Diagnoses   Final diagnoses:  Chest heaviness  Hx of pulmonary embolus during pregnancy  First trimester pregnancy    ED Discharge Orders         Ordered    enoxaparin (LOVENOX) 150 MG/ML injection  Every 24 hours     09/22/18 0415    LE VENOUS     09/22/18 0423           Antony MaduraHumes, Rubie Ficco, PA-C 09/22/18 0448    Nira Connardama, Pedro Eduardo, MD 09/22/18 [phone]

## 2018-09-22 NOTE — Discharge Instructions (Signed)
Your work-up in the emergency department today was reassuring.  You were found to be approximately [redacted] weeks pregnant.  Given your history of pulmonary embolus in the setting of pregnancy, you have been started back on Lovenox.  This was discussed with Dr. Marvel Plan on-call for your OB/GYN.  Call the office in the morning to schedule follow-up for routine prenatal care.  You may return to the ED for any new or concerning symptoms.

## 2018-09-22 NOTE — ED Notes (Signed)
Patient verbalizes understanding of discharge instructions. Opportunity for questioning and answers were provided. Armband removed by staff, pt discharged from ED ambulatory.   

## 2018-10-06 ENCOUNTER — Inpatient Hospital Stay (HOSPITAL_COMMUNITY)
Admission: AD | Admit: 2018-10-06 | Discharge: 2018-10-06 | Disposition: A | Discharge status: Home / Self Care | Payer: 59 | Attending: Obstetrics and Gynecology | Admitting: Obstetrics and Gynecology

## 2018-10-06 ENCOUNTER — Inpatient Hospital Stay (HOSPITAL_COMMUNITY): Payer: 59

## 2018-10-06 ENCOUNTER — Other Ambulatory Visit: Payer: Self-pay

## 2018-10-06 ENCOUNTER — Encounter (HOSPITAL_COMMUNITY): Payer: Self-pay

## 2018-10-06 DIAGNOSIS — R5383 Other fatigue: Secondary | ICD-10-CM | POA: Diagnosis not present

## 2018-10-06 DIAGNOSIS — Z20828 Contact with and (suspected) exposure to other viral communicable diseases: Secondary | ICD-10-CM | POA: Diagnosis not present

## 2018-10-06 DIAGNOSIS — O0380 Unspecified complication following complete or unspecified spontaneous abortion: Secondary | ICD-10-CM

## 2018-10-06 DIAGNOSIS — Z86711 Personal history of pulmonary embolism: Secondary | ICD-10-CM | POA: Diagnosis not present

## 2018-10-06 DIAGNOSIS — Z880 Allergy status to penicillin: Secondary | ICD-10-CM | POA: Diagnosis not present

## 2018-10-06 LAB — COMPREHENSIVE METABOLIC PANEL
ALT: 10 U/L (ref 0–44)
AST: 19 U/L (ref 15–41)
Albumin: 3.3 g/dL — ABNORMAL LOW (ref 3.5–5.0)
Alkaline Phosphatase: 61 U/L (ref 38–126)
Anion gap: 9 (ref 5–15)
BUN: 12 mg/dL (ref 6–20)
CO2: 21 mmol/L — ABNORMAL LOW (ref 22–32)
Calcium: 8.7 mg/dL — ABNORMAL LOW (ref 8.9–10.3)
Chloride: 104 mmol/L (ref 98–111)
Creatinine, Ser: 0.66 mg/dL (ref 0.44–1.00)
GFR calc Af Amer: >60 mL/min (ref >60)
GFR calc non Af Amer: >60 mL/min (ref >60)
Glucose, Bld: 113 mg/dL — ABNORMAL HIGH (ref 70–99)
Potassium: 4.1 mmol/L (ref 3.5–5.1)
Sodium: 134 mmol/L — ABNORMAL LOW (ref 135–145)
Total Bilirubin: 0.8 mg/dL (ref 0.3–1.2)
Total Protein: 6.9 g/dL (ref 6.5–8.1)

## 2018-10-06 LAB — CBC
HCT: 36 % (ref 36.0–46.0)
Hemoglobin: 11.6 g/dL — ABNORMAL LOW (ref 12.0–15.0)
MCH: 29.9 pg (ref 26.0–34.0)
MCHC: 32.2 g/dL (ref 30.0–36.0)
MCV: 92.8 fL (ref 80.0–100.0)
Platelets: 301 10*3/uL (ref 150–400)
RBC: 3.88 MIL/uL (ref 3.87–5.11)
RDW: 13.3 % (ref 11.5–15.5)
WBC: 5.6 10*3/uL (ref 4.0–10.5)
nRBC: 0 % (ref 0.0–0.2)

## 2018-10-06 LAB — POCT PREGNANCY, URINE: Preg Test, Ur: POSITIVE — AB

## 2018-10-06 LAB — SARS CORONAVIRUS 2 BY RT PCR (HOSPITAL ORDER, PERFORMED IN ~~LOC~~ HOSPITAL LAB): SARS Coronavirus 2: NEGATIVE

## 2018-10-06 LAB — HCG, QUANTITATIVE, PREGNANCY: hCG, Beta Chain, Quant, S: 4187 m[IU]/mL — ABNORMAL HIGH (ref <5)

## 2018-10-06 NOTE — MAU Provider Note (Signed)
History     CSN: [id number]  Arrival date and time: 10/06/18 1636   First Provider Initiated Contact with Patient 10/06/18 1851      Chief Complaint  Patient presents with  . Fatigue  . Muscle Pain   HPI   [name] is a 38 y.o. female [phone] @ Unknown here with fatigue. [facility] She bled on day 2, and bled up until 3 days ago. She is not bleeding currently. Last night she went to the bathroom and she saw something brown come out that looked solid. Last night she felt like she was coming down with something. Today she felt like something is off. Feels like she is coming down with something however cannot put her finger on it. She wants to make sure that her abortion is complete.  No fever, BP normal.  She does attests to unprotected intercourse 4 days.   OB History    Gravida  9   Para  8   Term  7   Preterm  1   AB  1   Living  7     SAB  0   TAB  1   Ectopic      Multiple  0   Live Births  8           Past Medical History:  Diagnosis Date  . AMA (advanced maternal age) multigravida 35+   . Breast feeding status of mother   . GERD (gastroesophageal reflux disease)   . Headache   . Hx of blood clots   . Hx of varicella   . Hyperlipemia   . Hypertension    takes meds  . Pregnancy induced hypertension   . Pulmonary embolism [facility](HCC)     Past Surgical History:  Procedure Laterality Date  . CHOLECYSTECTOMY      Family History  Problem Relation Age of Onset  . Asthma Mother   . Heart disease Mother   . Hypertension Mother   . Stroke Mother   . Cancer Father        prostate  . Alcohol abuse Neg Hx   . Arthritis Neg Hx   . Birth defects Neg Hx   . COPD Neg Hx   . Depression Neg Hx   . Diabetes Neg Hx   . Drug abuse Neg Hx   . Early death Neg Hx   . Hearing loss Neg Hx   . Hyperlipidemia Neg Hx   . Kidney disease Neg Hx   . Learning disabilities Neg Hx   . Mental illness Neg Hx    . Mental retardation Neg Hx   . Miscarriages / Stillbirths Neg Hx   . Vision loss Neg Hx   . Varicose Veins Neg Hx     Social History   Tobacco Use  . Smoking status: Never Smoker  . Smokeless tobacco: Never Used  Substance Use Topics  . Alcohol use: No  . Drug use: No    Allergies:  Allergies  Allergen Reactions  . Penicillins Anaphylaxis    Has patient had a PCN reaction causing immediate rash, facial/tongue/throat swelling, SOB or lightheadedness with hypotension: Yes Has patient had a PCN reaction causing severe rash involving mucus membranes or skin necrosis: No Has patient had a PCN reaction that required hospitalization No Has patient had a PCN reaction occurring within the last 10 years: No If all of the above answers are "NO", then may proceed with  Cephalosporin use.     Medications Prior to Admission  Medication Sig Dispense Refill Last Dose  . enoxaparin (LOVENOX) 150 MG/ML injection Inject 0.73 mLs (110 mg total) into the skin daily. 22 mL 1   . losartan (COZAAR) 50 MG tablet Take 50 mg by mouth daily.     Marland Kitchen. omeprazole (PRILOSEC) 20 MG capsule Take 1 capsule (20 mg total) by mouth daily. (Patient not taking: Reported on 09/22/2018) 30 capsule 0    Results for orders placed or performed during the hospital encounter of 10/06/18 (from the past 48 hour(s))  Pregnancy, urine POC     Status: Abnormal   Collection Time: 10/06/18  5:00 PM  Result Value Ref Range   Preg Test, Ur POSITIVE (A) NEGATIVE    Comment:        THE SENSITIVITY OF THIS METHODOLOGY IS >24 mIU/mL   CBC     Status: Abnormal   Collection Time: 10/06/18  5:43 PM  Result Value Ref Range   WBC 5.6 4.0 - 10.5 K/uL   RBC 3.88 3.87 - 5.11 MIL/uL   Hemoglobin 11.6 (L) 12.0 - 15.0 g/dL   HCT 40.936.0 81.136.0 - 91.446.0 %   MCV 92.8 80.0 - 100.0 fL   MCH 29.9 26.0 - 34.0 pg   MCHC 32.2 30.0 - 36.0 g/dL   RDW 78.213.3 95.611.5 - 21.315.5 %   Platelets 301 150 - 400 K/uL   nRBC 0.0 0.0 - 0.2 %    Comment: Performed at  [facility], [address] [place], [place] [address]  Comprehensive metabolic panel     Status: Abnormal   Collection Time: 10/06/18  5:43 PM  Result Value Ref Range   Sodium 134 (L) 135 - 145 mmol/L   Potassium 4.1 3.5 - 5.1 mmol/L    Comment: HEMOLYSIS AT THIS LEVEL MAY AFFECT RESULT   Chloride 104 98 - 111 mmol/L   CO2 21 (L) 22 - 32 mmol/L   Glucose, Bld 113 (H) 70 - 99 mg/dL   BUN 12 6 - 20 mg/dL   Creatinine, Ser 8.460.66 0.44 - 1.00 mg/dL   Calcium 8.7 (L) 8.9 - 10.3 mg/dL   Total Protein 6.9 6.5 - 8.1 g/dL   Albumin 3.3 (L) 3.5 - 5.0 g/dL   AST 19 15 - 41 U/L   ALT 10 0 - 44 U/L   Alkaline Phosphatase 61 38 - 126 U/L   Total Bilirubin 0.8 0.3 - 1.2 mg/dL   GFR calc non Af Amer >60 >60 mL/min   GFR calc Af Amer >60 >60 mL/min   Anion gap 9 5 - 15    Comment: Performed at [facility], [address] [place], [place] [address]  hCG, quantitative, pregnancy     Status: Abnormal   Collection Time: 10/06/18  5:43 PM  Result Value Ref Range   hCG, Beta Chain, Quant, S 4,187 (H) <5 mIU/mL    Comment:          GEST. AGE      CONC.  (mIU/mL)   <=1 WEEK        5 - 50     2 WEEKS       50 - 500     3 WEEKS       100 - 10,000     4 WEEKS     1,000 - 30,000     5 WEEKS     3,500 - 115,000   6-8  WEEKS     12,000 - 270,000    12 WEEKS     15,000 - 220,000        FEMALE AND NON-PREGNANT FEMALE:     LESS THAN 5 mIU/mL Performed at [facility], [place] [address], [place], [place] [address]     US Ob Comp Less 14 Wks  Result Date: 10/06/2018 CLINICAL DATA:  Increased vaginal bleeding. Status post medical abortion several weeks ago. Now with muscle aches. EXAM: OBSTETRIC <14 WK [place] AND TRANSVAGINAL OB US TECHNIQUE: Both transabdominal and transvaginal ultrasound examinations were performed for complete evaluation of the gestation as well as the maternal uterus, adnexal regions, and pelvic cul-de-sac. Transvaginal technique was performed to assess early pregnancy.  COMPARISON:  None. FINDINGS: Intrauterine gestational sac: None Yolk sac:  Not Visualized. Embryo:  Not Visualized. Cardiac Activity: Not Visualized. Maternal uterus/adnexae: Right ovary: Not visualized due to overlying bowel gas. Left ovary: Not visualized due to overlying bowel gas. Other :The endometrium appears thickened and heterogeneous measuring 1.7 cm in thickness with increased vascularity Free fluid:  No free fluid identified. IMPRESSION: 1. No intrauterine gestation identified. 2. Thickened and heterogeneous endometrium is identified with increased blood flow measuring 1.7 cm. In the appropriate clinical setting findings may represent retained products of conception. Correlation with quantitative beta HCG is advised. Electronically Signed   By: Kerby Moors M.D.   On: 10/06/2018 20:03   US Ob Transvaginal  Result Date: 10/06/2018 CLINICAL DATA:  Increased vaginal bleeding. Status post medical abortion several weeks ago. Now with muscle aches. EXAM: OBSTETRIC <14 WK [place] AND TRANSVAGINAL OB US TECHNIQUE: Both transabdominal and transvaginal ultrasound examinations were performed for complete evaluation of the gestation as well as the maternal uterus, adnexal regions, and pelvic cul-de-sac. Transvaginal technique was performed to assess early pregnancy. COMPARISON:  None. FINDINGS: Intrauterine gestational sac: None Yolk sac:  Not Visualized. Embryo:  Not Visualized. Cardiac Activity: Not Visualized. Maternal uterus/adnexae: Right ovary: Not visualized due to overlying bowel gas. Left ovary: Not visualized due to overlying bowel gas. Other :The endometrium appears thickened and heterogeneous measuring 1.7 cm in thickness with increased vascularity Free fluid:  No free fluid identified. IMPRESSION: 1. No intrauterine gestation identified. 2. Thickened and heterogeneous endometrium is identified with increased blood flow measuring 1.7 cm. In the appropriate clinical setting findings may represent retained  products of conception. Correlation with quantitative beta HCG is advised. Electronically Signed   By: Kerby Moors M.D.   On: 10/06/2018 20:03   Review of Systems  Constitutional: Positive for fatigue. Negative for chills and fever.  HENT: Negative for sore throat.   Respiratory: Negative for chest tightness and shortness of breath.   Cardiovascular: Negative for chest pain.  Neurological: Positive for weakness and headaches.   Physical Exam   Blood pressure 139/76, pulse 95, temperature 98.5 F (36.9 C), temperature source Oral, resp. rate 18, weight 114.4 kg, SpO2 99 %, not currently breastfeeding.  Physical Exam  Constitutional: She is oriented to person, place, and time. She appears well-developed and well-nourished. No distress.  HENT:  Head: Normocephalic.  Musculoskeletal: Normal range of motion.  Neurological: She is alert and oriented to person, place, and time.  Skin: Skin is warm. She is not diaphoretic.  Psychiatric: Her behavior is normal.   MAU Course  Procedures None  MDM  Hcg level A positive blood type  CBC [place] ordered  Patient has an appointment on Wednesday in the office for follow up. Confirmed this with  Dr. Mindi SlickerBanga.   Assessment and Plan   A:  1. Other fatigue   2. Post-abortion complication     P;  Discharge home in stable condition Covid pending- quarantine until results back. Work note provided Return to MAU if symptoms worsen Keep your f/u visit in the office on Wednesday.  Duane Lopeasch, Jennifer I, NP 10/07/2018 4:25 PM

## 2018-10-06 NOTE — Discharge Instructions (Signed)

## 2018-10-06 NOTE — MAU Note (Addendum)
"  Couple wks ago had a medical abortion."  Had been doing ok until a couple days ago.  Having muscle aches(neck and shoulders), feels really tired, denies fever.  Just concerned if she has an infection or something.  Last night after she got home, she passed a brown stringy clump of something. Denies abd pain. Stopped bleeding 3 days. Ago.

## 2018-10-06 NOTE — Progress Notes (Addendum)
Pt states she took "abortion pill" from [facility] on 7/27 or 7/28.  States there are consecutive pills that need to be taken & she took all of them.

## 2018-11-27 ENCOUNTER — Other Ambulatory Visit: Payer: Self-pay

## 2018-11-27 ENCOUNTER — Emergency Department (HOSPITAL_COMMUNITY)
Admission: EM | Admit: 2018-11-27 | Discharge: 2018-11-27 | Disposition: A | Discharge status: Home / Self Care | Payer: 59 | Attending: Emergency Medicine | Admitting: Emergency Medicine

## 2018-11-27 ENCOUNTER — Encounter (HOSPITAL_COMMUNITY): Payer: Self-pay

## 2018-11-27 ENCOUNTER — Emergency Department (HOSPITAL_COMMUNITY): Payer: 59

## 2018-11-27 DIAGNOSIS — I10 Essential (primary) hypertension: Secondary | ICD-10-CM | POA: Diagnosis not present

## 2018-11-27 DIAGNOSIS — Z86711 Personal history of pulmonary embolism: Secondary | ICD-10-CM | POA: Diagnosis not present

## 2018-11-27 DIAGNOSIS — Z79899 Other long term (current) drug therapy: Secondary | ICD-10-CM | POA: Diagnosis not present

## 2018-11-27 DIAGNOSIS — R0789 Other chest pain: Secondary | ICD-10-CM | POA: Diagnosis not present

## 2018-11-27 DIAGNOSIS — R030 Elevated blood-pressure reading, without diagnosis of hypertension: Secondary | ICD-10-CM

## 2018-11-27 DIAGNOSIS — R079 Chest pain, unspecified: Secondary | ICD-10-CM

## 2018-11-27 LAB — I-STAT BETA HCG BLOOD, ED (MC, WL, AP ONLY): I-stat hCG, quantitative: <5 m[IU]/mL (ref <5)

## 2018-11-27 LAB — BASIC METABOLIC PANEL
Anion gap: 9 (ref 5–15)
BUN: 12 mg/dL (ref 6–20)
CO2: 23 mmol/L (ref 22–32)
Calcium: 8.8 mg/dL — ABNORMAL LOW (ref 8.9–10.3)
Chloride: 107 mmol/L (ref 98–111)
Creatinine, Ser: 0.75 mg/dL (ref 0.44–1.00)
GFR calc Af Amer: >60 mL/min (ref >60)
GFR calc non Af Amer: >60 mL/min (ref >60)
Glucose, Bld: 125 mg/dL — ABNORMAL HIGH (ref 70–99)
Potassium: 3.7 mmol/L (ref 3.5–5.1)
Sodium: 139 mmol/L (ref 135–145)

## 2018-11-27 LAB — CBC
HCT: 38.7 % (ref 36.0–46.0)
Hemoglobin: 12.4 g/dL (ref 12.0–15.0)
MCH: 30.5 pg (ref 26.0–34.0)
MCHC: 32 g/dL (ref 30.0–36.0)
MCV: 95.3 fL (ref 80.0–100.0)
Platelets: 259 10*3/uL (ref 150–400)
RBC: 4.06 MIL/uL (ref 3.87–5.11)
RDW: 13.6 % (ref 11.5–15.5)
WBC: 4.4 10*3/uL (ref 4.0–10.5)
nRBC: 0 % (ref 0.0–0.2)

## 2018-11-27 LAB — TROPONIN I (HIGH SENSITIVITY)
Troponin I (High Sensitivity): 4 ng/L (ref <18)
Troponin I (High Sensitivity): 6 ng/L (ref <18)

## 2018-11-27 MED ORDER — SODIUM CHLORIDE 0.9% FLUSH: 3.0000 mL | Freq: Once | INTRAVENOUS | Status: DC

## 2018-11-27 NOTE — ED Triage Notes (Signed)
Pt reports chest pain for 1 week started on the right and now it is on the left. Pt denies any other complaints. Pt a.o, nad noted.

## 2018-11-27 NOTE — Discharge Instructions (Signed)
It was my pleasure taking care of you today!   Call your primary care doctor in the morning to schedule a follow-up appointment for blood pressure check.  Return to ER for difficulty breathing, sudden sweating, worsening chest pain, new or worsening symptoms, any additional concerns.

## 2018-11-27 NOTE — ED Provider Notes (Signed)
[place] EMERGENCY DEPARTMENT Provider Note   CSN: [id number] Arrival date & time: 11/27/18  1520     History   Chief Complaint Chief Complaint  Patient presents with  . Chest Pain    HPI Ramey Ketcherside is a 38 y.o. female.     The history is provided by the patient and medical records. No language interpreter was used.  Chest Pain Associated symptoms: no palpitations    Lyrica Mcclarty is a 38 y.o. female  with a PMH as listed below including previous PE in the setting of pregnancy, HTN who presents to the Emergency Department complaining of intermittent central chest pain which is been ongoing for the last week.  Typically is on the right side, but sometimes she will have pain on the left.  It is not pleuritic nor exertional.  Sometimes will occur at rest.  She denies any known alleviating or aggravating factors.  Does feel similar to her chest pain that she has had in the past with reassuring work-ups.  She checked her blood pressure and it was in the 150s to 546E range systolic.  She reports that typically her blood pressures are around 120s.  Her doctor did switch her from amlodipine and metoprolol to losartan a few months ago.  Her blood pressures have been doing well until this week.  She denies any associated shortness of breath, nausea, diaphoresis.  No abdominal pain or back pain.  Pain does not radiate to the neck or extremities.  No lower extremity pain or swelling.  No recent surgery/travel/immobilizations.  She is not currently taking birth control pills or any other hormones.  Past Medical History:  Diagnosis Date  . AMA (advanced maternal age) multigravida 66+   . Breast feeding status of mother   . GERD (gastroesophageal reflux disease)   . Headache   . Hx of blood clots   . Hx of varicella   . Hyperlipemia   . Hypertension    takes meds  . Pregnancy induced hypertension   . Pulmonary embolism [facility])     Patient Active Problem List   Diagnosis Date Noted  . Term pregnancy 04/25/2018  . Vaginal delivery 04/25/2018  . Adherent placenta 04/25/2018  . Missed abortion with fetal demise before 38 completed weeks of gestation 05/03/2017  . Precipitous delivery 02/19/2015  . Postpartum care following vaginal delivery 02/19/2015  . Active labor at term 02/18/2015  . Pre-diabetes 05/14/2014  . Elevated LDL cholesterol level 05/14/2014  . Pulmonary embolism ([place]) 10/17/2013  . Labor and delivery indication for care or intervention 10/09/2013  . Spontaneous abortion in second trimester 10/09/2013  . SVD (spontaneous vaginal delivery) 03/02/2013    Past Surgical History:  Procedure Laterality Date  . CHOLECYSTECTOMY       OB History    Gravida  9   Para  8   Term  7   Preterm  1   AB  1   Living  7     SAB  0   TAB  1   Ectopic      Multiple  0   Live Births  8            Home Medications    Prior to Admission medications   Medication Sig Start Date End Date Taking? Authorizing Provider  enoxaparin (LOVENOX) 150 MG/ML injection Inject 0.73 mLs (110 mg total) into the skin daily. 09/22/18 10/22/18  Antonietta Breach, PA-C  losartan (COZAAR) 50 MG tablet Take 50  mg by mouth daily. 06/03/18   [provider]  omeprazole (PRILOSEC) 20 MG capsule Take 1 capsule (20 mg total) by mouth daily. Patient not taking: Reported on 09/22/2018 07/05/18   Renne Crigler, PA-C    Family History Family History  Problem Relation Age of Onset  . Asthma Mother   . Heart disease Mother   . Hypertension Mother   . Stroke Mother   . Cancer Father        prostate  . Alcohol abuse Neg Hx   . Arthritis Neg Hx   . Birth defects Neg Hx   . COPD Neg Hx   . Depression Neg Hx   . Diabetes Neg Hx   . Drug abuse Neg Hx   . Early death Neg Hx   . Hearing loss Neg Hx   . Hyperlipidemia Neg Hx   . Kidney disease Neg Hx   . Learning disabilities Neg Hx   . Mental illness Neg Hx   . Mental retardation Neg Hx   .  Miscarriages / Stillbirths Neg Hx   . Vision loss Neg Hx   . Varicose Veins Neg Hx     Social History Social History   Tobacco Use  . Smoking status: Never Smoker  . Smokeless tobacco: Never Used  Substance Use Topics  . Alcohol use: No  . Drug use: No     Allergies   Penicillins   Review of Systems Review of Systems  Cardiovascular: Positive for chest pain. Negative for palpitations and leg swelling.  All other systems reviewed and are negative.    Physical Exam Updated Vital Signs BP (!) 150/103 (BP Location: Left Arm)   Pulse 65   Temp 98.2 F (36.8 C) (Oral)   Resp 16   SpO2 100%   Physical Exam Vitals signs and nursing note reviewed.  Constitutional:      General: She is not in acute distress.    Appearance: She is well-developed.  HENT:     Head: Normocephalic and atraumatic.  Neck:     Musculoskeletal: Neck supple.  Cardiovascular:     Rate and Rhythm: Normal rate and regular rhythm.     Heart sounds: Normal heart sounds. No murmur.  Pulmonary:     Effort: Pulmonary effort is normal. No respiratory distress.     Breath sounds: Normal breath sounds.  Abdominal:     General: There is no distension.     Palpations: Abdomen is soft.     Tenderness: There is no abdominal tenderness.  Musculoskeletal:     Comments: No lower extremity edema or calf tenderness.  Skin:    General: Skin is warm and dry.  Neurological:     Mental Status: She is alert and oriented to person, place, and time.      ED Treatments / Results  Labs (all labs ordered are listed, but only abnormal results are displayed) Labs Reviewed  BASIC METABOLIC PANEL - Abnormal; Notable for the following components:      Result Value   Glucose, Bld 125 (*)    Calcium 8.8 (*)    All other components within normal limits  CBC  I-STAT BETA HCG BLOOD, ED ([facility], [facility], [facility] ONLY)  TROPONIN I (HIGH SENSITIVITY)  TROPONIN I (HIGH SENSITIVITY)    EKG EKG Interpretation  Date/Time:   Thursday November 27 2018 15:25:13 EDT Ventricular Rate:  73 PR Interval:  148 QRS Duration: 88 QT Interval:  368 QTC Calculation: 405 R Axis:  85 Text Interpretation:  Normal sinus rhythm Normal ECG Since last tracing rate slower Confirmed by Mancel BaleWentz, Elliott [id number]) on 11/27/2018 8:56:05 PM   Radiology Dg Chest 2 View  Result Date: 11/27/2018 CLINICAL DATA:  Left-sided chest pain EXAM: CHEST - 2 VIEW COMPARISON:  None. FINDINGS: The heart size and mediastinal contours are within normal limits. Both lungs are clear. The visualized skeletal structures are unremarkable. IMPRESSION: No active cardiopulmonary disease. Electronically Signed   By: Duanne GuessNicholas  Plundo M.D.   On: 11/27/2018 16:04    Procedures Procedures (including critical care time)  Medications Ordered in ED Medications  sodium chloride flush (NS) 0.9 % injection 3 mL (3 mLs Intravenous Not Given 11/27/18 2106)     Initial Impression / Assessment and Plan / ED Course  I have reviewed the triage vital signs and the nursing notes.  Pertinent labs & imaging results that were available during my care of the patient were reviewed by me and considered in my medical decision making (see chart for details).       Antoine PocheValiencia Conklin is a 38 y.o. female who presents to ED for chest pain x 1 week. On exam, patient is afebrile, well appearing, hemodynamically stable with normal cardiopulmonary examination. Labs reviewed and reassuring with negative troponin x2.  CXR with no acute abnormalities.  EKG without acute ischemic changes. Low risk heart score 2.  She was chest pain-free on initial evaluation as well as on repeat evaluation just prior to discharge.  Patient does have a history of provoked PE in the setting of pregnancy several years ago.  She has had no further PEs or other clotting issues since then.  Since her PE, she has had at least 6 CT angiograms all of which have been negative.  She states that her pain today does not  feel similar to her previous blood clot.   She is not on OCP's currently. No LE pain / swelling.She is considered low risk for PE based on Wells criteria. Discussed this with her & we both agreed that no further testing PE needs to be done at this time, however did encourage her to return should her symptoms worsen.   Patient's symptoms unlikely to be of cardiac etiology. Labs and imaging reviewed again prior to discharge.  I discussed return precautions with her at length.  Evaluation does not show pathology that would require ongoing emergent intervention or inpatient treatment. Encouraged to follow up with PCP. Patient understands return precautions and follow up plan. All questions answered.  Final Clinical Impressions(s) / ED Diagnoses   Final diagnoses:  Chest pain with low risk for cardiac etiology  Elevated blood pressure reading    ED Discharge Orders    None       Ward, Chase PicketJaime Pilcher, PA-C 11/27/18 2142    Mancel BaleWentz, Elliott, MD 11/27/18 2240

## 2018-11-28 ENCOUNTER — Other Ambulatory Visit: Payer: Self-pay | Admitting: Nephrology

## 2018-11-28 DIAGNOSIS — I10 Essential (primary) hypertension: Secondary | ICD-10-CM

## 2018-11-28 DIAGNOSIS — N289 Disorder of kidney and ureter, unspecified: Secondary | ICD-10-CM

## 2018-11-28 DIAGNOSIS — R809 Proteinuria, unspecified: Secondary | ICD-10-CM

## 2018-12-08 ENCOUNTER — Ambulatory Visit
Admission: RE | Admit: 2018-12-08 | Discharge: 2018-12-08 | Disposition: A | Discharge status: Home / Self Care | Payer: 59 | Source: Ambulatory Visit | Attending: Nephrology | Admitting: Nephrology

## 2018-12-08 DIAGNOSIS — N289 Disorder of kidney and ureter, unspecified: Secondary | ICD-10-CM

## 2018-12-08 DIAGNOSIS — I10 Essential (primary) hypertension: Secondary | ICD-10-CM

## 2018-12-08 DIAGNOSIS — R809 Proteinuria, unspecified: Secondary | ICD-10-CM

## 2019-01-14 ENCOUNTER — Emergency Department (HOSPITAL_COMMUNITY)
Admission: EM | Admit: 2019-01-14 | Discharge: 2019-01-14 | Disposition: A | Discharge status: Home / Self Care | Payer: 59 | Attending: Emergency Medicine | Admitting: Emergency Medicine

## 2019-01-14 ENCOUNTER — Other Ambulatory Visit: Payer: Self-pay

## 2019-01-14 ENCOUNTER — Encounter (HOSPITAL_COMMUNITY): Payer: Self-pay

## 2019-01-14 DIAGNOSIS — Z7982 Long term (current) use of aspirin: Secondary | ICD-10-CM | POA: Diagnosis not present

## 2019-01-14 DIAGNOSIS — R42 Dizziness and giddiness: Secondary | ICD-10-CM | POA: Insufficient documentation

## 2019-01-14 DIAGNOSIS — I10 Essential (primary) hypertension: Secondary | ICD-10-CM | POA: Diagnosis not present

## 2019-01-14 DIAGNOSIS — H6123 Impacted cerumen, bilateral: Secondary | ICD-10-CM | POA: Insufficient documentation

## 2019-01-14 DIAGNOSIS — R002 Palpitations: Secondary | ICD-10-CM | POA: Diagnosis not present

## 2019-01-14 LAB — COMPREHENSIVE METABOLIC PANEL
ALT: 14 U/L (ref 0–44)
AST: 15 U/L (ref 15–41)
Albumin: 3.8 g/dL (ref 3.5–5.0)
Alkaline Phosphatase: 78 U/L (ref 38–126)
Anion gap: 9 (ref 5–15)
BUN: 16 mg/dL (ref 6–20)
CO2: 24 mmol/L (ref 22–32)
Calcium: 8.8 mg/dL — ABNORMAL LOW (ref 8.9–10.3)
Chloride: 104 mmol/L (ref 98–111)
Creatinine, Ser: 0.6 mg/dL (ref 0.44–1.00)
GFR calc Af Amer: >60 mL/min (ref >60)
GFR calc non Af Amer: >60 mL/min (ref >60)
Glucose, Bld: 98 mg/dL (ref 70–99)
Potassium: 4 mmol/L (ref 3.5–5.1)
Sodium: 137 mmol/L (ref 135–145)
Total Bilirubin: 0.3 mg/dL (ref 0.3–1.2)
Total Protein: 8.1 g/dL (ref 6.5–8.1)

## 2019-01-14 LAB — CBC
HCT: 41.8 % (ref 36.0–46.0)
Hemoglobin: 13.3 g/dL (ref 12.0–15.0)
MCH: 29.5 pg (ref 26.0–34.0)
MCHC: 31.8 g/dL (ref 30.0–36.0)
MCV: 92.7 fL (ref 80.0–100.0)
Platelets: 296 10*3/uL (ref 150–400)
RBC: 4.51 MIL/uL (ref 3.87–5.11)
RDW: 13.4 % (ref 11.5–15.5)
WBC: 7 10*3/uL (ref 4.0–10.5)
nRBC: 0 % (ref 0.0–0.2)

## 2019-01-14 LAB — URINALYSIS, ROUTINE W REFLEX MICROSCOPIC
Bacteria, UA: NONE SEEN
Bilirubin Urine: NEGATIVE
Glucose, UA: NEGATIVE mg/dL
Ketones, ur: NEGATIVE mg/dL
Leukocytes,Ua: NEGATIVE
Nitrite: NEGATIVE
Protein, ur: NEGATIVE mg/dL
Specific Gravity, Urine: 1.009 (ref 1.005–1.030)
pH: 6 (ref 5.0–8.0)

## 2019-01-14 LAB — TSH: TSH: 1.262 u[IU]/mL (ref 0.350–4.500)

## 2019-01-14 LAB — T4, FREE: Free T4: 0.83 ng/dL (ref 0.61–1.12)

## 2019-01-14 MED ORDER — MECLIZINE HCL 25 MG PO TABS: 25.0000 mg | ORAL_TABLET | Freq: Three times a day (TID) | ORAL | 0 refills | Status: DC | PRN

## 2019-01-14 MED ORDER — MECLIZINE HCL 25 MG PO TABS
12.5000 mg | ORAL_TABLET | Freq: Once | ORAL | Status: AC
  Administered 2019-01-14: 05:00:00 12.5 mg via ORAL
  Filled 2019-01-14: qty 1

## 2019-01-14 NOTE — Discharge Instructions (Addendum)
Thank you for allowing me to care for you today in the Emergency Department.   You can try taking 1 tablet of meclizine every 8 hours as needed for dizziness.  Use caution if he have to work or drive as this medication can make you slightly drowsy until you know how it impacts you.  As we discussed, both of your ears have a large amount of hard earwax occluding the canal.  You can try over-the-counter earwax removal, but I suspect that this will take several days as the wax appears very hard.  Most of these treatments try to gently soften the wax over time.  If you are unable to remove the wax at home, you can follow-up with primary care.  Return to the emergency department if you develop dizziness that is persistent or accompanied by severe headache, respiratory distress, high fevers, if you pass out, chest pain that is worse with exertion, leg pain and swelling, or other new, concerning symptoms.

## 2019-01-14 NOTE — ED Triage Notes (Addendum)
Pt reports chest discomfort and a warm feeling that lasted a couple of minutes at work this evening. No chest complaint now. Pt also reports episodes of lightheadedness today.

## 2019-01-14 NOTE — ED Provider Notes (Signed)
[place] DEPT Provider Note   CSN: [id number] Arrival date & time: 01/14/19  0054     History   Chief Complaint Chief Complaint  Patient presents with   Dizziness    HPI Mindy Wade is a 38 y.o. female with a history of provoked PE, HTN, HLD, and GERD who presents to the emergency department with a chief complaint of dizziness.  The patient reports that she was at work tonight sitting at her desk when she began having palpitations where she felt as if her heart was skipping a beat or 2 and was accompanied by a "warm feeling" spreading through her body and lightheadedness.  She reports that the episode lasted for a couple of minutes before completely resolving.  She also reports some right-sided tinnitus, described as a whooshing sound, in her right ear while she was in route to the hospital, which is also resolved.  She was concerned that her blood sugar may be low, her blood pressure may be high, or she may be dehydrated.  She reports that she works 10 to 12-hour shifts at the main [employer].  States that she frequently hydrates well during her shifts.  She denies chest pain, shortness of breath, back pain, headache, visual changes, numbness, weakness, leg swelling, cough, fever, chills, nausea, vomiting, diarrhea, or diaphoresis.  She also reports that for the last week that she has been having intermittent episodes of dizziness and lightheadedness.  She states that the episodes will last for a few seconds at a time before resolving.  The episodes seem to be worse when she is standing still and improve when she is up and moving around.  She does also note that they seem to be worse when she moves her head.   She is currently 9 months postpartum.  No complications with the pregnancy, but she is being followed by nephrology for proteinuria that she developed during her pregnancy.  No history of HELLP, eclampsia, or preeclampsia.    She recently had  a Holter monitor performed by cardiology, which was unremarkable.  During her previous ER visit, she was diagnosed with a goiter and followed up with endocrinology and was told that the nodules on her thyroid were most likely a variant of normal.  She also notes that she underwent a cerumen disimpaction several years ago when she was having dizziness.     The history is provided by the patient. No language interpreter was used.    Past Medical History:  Diagnosis Date   AMA (advanced maternal age) multigravida 59+    Breast feeding status of mother    GERD (gastroesophageal reflux disease)    Headache    Hx of blood clots    Hx of varicella    Hyperlipemia    Hypertension    takes meds   Pregnancy induced hypertension    Pulmonary embolism [facility])     Patient Active Problem List   Diagnosis Date Noted   Term pregnancy 04/25/2018   Vaginal delivery 04/25/2018   Adherent placenta 04/25/2018   Missed abortion with fetal demise before 20 completed weeks of gestation 05/03/2017   Precipitous delivery 02/19/2015   Postpartum care following vaginal delivery 02/19/2015   Active labor at term 02/18/2015   Pre-diabetes 05/14/2014   Elevated LDL cholesterol level 05/14/2014   Pulmonary embolism ([place]) 10/17/2013   Labor and delivery indication for care or intervention 10/09/2013   Spontaneous abortion in second trimester 10/09/2013   SVD (spontaneous vaginal delivery)  03/02/2013    Past Surgical History:  Procedure Laterality Date   CHOLECYSTECTOMY       OB History    Gravida  9   Para  8   Term  7   Preterm  1   AB  1   Living  7     SAB  0   TAB  1   Ectopic      Multiple  0   Live Births  8            Home Medications    Prior to Admission medications   Medication Sig Start Date End Date Taking? Authorizing Provider  aspirin EC 81 MG tablet Take 81 mg by mouth daily.   Yes [provider]  losartan (COZAAR) 50 MG  tablet Take 50 mg by mouth 2 (two) times daily.  06/03/18  Yes [provider]  Multiple Vitamin (MULTIVITAMIN WITH MINERALS) TABS tablet Take 1 tablet by mouth daily.   Yes [provider]  meclizine (ANTIVERT) 25 MG tablet Take 1 tablet (25 mg total) by mouth 3 (three) times daily as needed for dizziness. 01/14/19   Constantine Ruddick A, PA-C  enoxaparin (LOVENOX) 150 MG/ML injection Inject 0.73 mLs (110 mg total) into the skin daily. Patient not taking: Reported on 01/14/2019 09/22/18 01/14/19  Antony Madura, PA-C  omeprazole (PRILOSEC) 20 MG capsule Take 1 capsule (20 mg total) by mouth daily. Patient not taking: Reported on 09/22/2018 07/05/18 01/14/19  Renne Crigler, PA-C    Family History Family History  Problem Relation Age of Onset   Asthma Mother    Heart disease Mother    Hypertension Mother    Stroke Mother    Cancer Father        prostate   Alcohol abuse Neg Hx    Arthritis Neg Hx    Birth defects Neg Hx    COPD Neg Hx    Depression Neg Hx    Diabetes Neg Hx    Drug abuse Neg Hx    Early death Neg Hx    Hearing loss Neg Hx    Hyperlipidemia Neg Hx    Kidney disease Neg Hx    Learning disabilities Neg Hx    Mental illness Neg Hx    Mental retardation Neg Hx    Miscarriages / Stillbirths Neg Hx    Vision loss Neg Hx    Varicose Veins Neg Hx     Social History Social History   Tobacco Use   Smoking status: Never Smoker   Smokeless tobacco: Never Used  Substance Use Topics   Alcohol use: No   Drug use: No     Allergies   Penicillins   Review of Systems Review of Systems  Constitutional: Negative for activity change, chills, diaphoresis, fatigue and fever.  HENT: Positive for tinnitus. Negative for ear discharge, ear pain, facial swelling, hearing loss, sinus pressure, sinus pain, sore throat and voice change.   Eyes: Negative for visual disturbance.  Respiratory: Negative for cough, chest tightness, shortness of  breath and wheezing.   Cardiovascular: Positive for palpitations. Negative for chest pain and leg swelling.  Gastrointestinal: Negative for abdominal pain, anal bleeding, blood in stool, diarrhea, nausea and vomiting.  Genitourinary: Negative for dysuria.  Musculoskeletal: Negative for back pain.  Skin: Negative for rash.  Allergic/Immunologic: Negative for immunocompromised state.  Neurological: Positive for dizziness. Negative for seizures, syncope, weakness, light-headedness, numbness and headaches.  Psychiatric/Behavioral: Negative for confusion.   Physical Exam  Updated Vital Signs BP (!) 143/106    Pulse 85    Temp 98.5 F (36.9 C) (Oral)    Resp 16    Ht 5\' 10"  (1.778 m)    Wt 111.1 kg    SpO2 99%    BMI 35.15 kg/m   Physical Exam Vitals signs and nursing note reviewed.  Constitutional:      General: She is not in acute distress.    Comments: Well appearing. NAD.   HENT:     Head: Normocephalic.     Right Ear: External ear normal. There is impacted cerumen.     Left Ear: External ear normal. There is impacted cerumen.     Nose: Nose normal.     Mouth/Throat:     Mouth: Mucous membranes are moist.     Pharynx: No oropharyngeal exudate or posterior oropharyngeal erythema.  Eyes:     General: No scleral icterus.    Conjunctiva/sclera: Conjunctivae normal.  Neck:     Musculoskeletal: Neck supple.     Thyroid: No thyroid tenderness.  Cardiovascular:     Rate and Rhythm: Normal rate and regular rhythm.     Pulses: Normal pulses.     Heart sounds: Normal heart sounds. No murmur. No friction rub. No gallop.   Pulmonary:     Effort: Pulmonary effort is normal. No respiratory distress.     Breath sounds: No stridor. No wheezing, rhonchi or rales.  Chest:     Chest wall: No tenderness.  Abdominal:     General: There is no distension.     Palpations: Abdomen is soft. There is no mass.     Tenderness: There is no abdominal tenderness. There is no right CVA tenderness, left CVA  tenderness, guarding or rebound.     Hernia: No hernia is present.  Musculoskeletal:     Right lower leg: No edema.     Left lower leg: No edema.  Skin:    General: Skin is warm.     Capillary Refill: Capillary refill takes less than 2 seconds.     Findings: No rash.  Neurological:     General: No focal deficit present.     Mental Status: She is alert and oriented to person, place, and time.     Cranial Nerves: No cranial nerve deficit.     Sensory: No sensory deficit.     Motor: No weakness.     Coordination: Coordination normal.     Gait: Gait normal.  Psychiatric:        Behavior: Behavior normal.    ED Treatments / Results  Labs (all labs ordered are listed, but only abnormal results are displayed) Labs Reviewed  URINALYSIS, ROUTINE W REFLEX MICROSCOPIC - Abnormal; Notable for the following components:      Result Value   Color, Urine STRAW (*)    Hgb urine dipstick SMALL (*)    All other components within normal limits  COMPREHENSIVE METABOLIC PANEL - Abnormal; Notable for the following components:   Calcium 8.8 (*)    All other components within normal limits  CBC  TSH  T3, FREE  T4, FREE    EKG None  Radiology No results found.  Procedures Procedures (including critical care time)  Medications Ordered in ED Medications  meclizine (ANTIVERT) tablet 12.5 mg (12.5 mg Oral Given 01/14/19 0446)     Initial Impression / Assessment and Plan / ED Course  I have reviewed the triage vital signs and the nursing notes.  Pertinent labs & imaging results that were available during my care of the patient were reviewed by me and considered in my medical decision making (see chart for details).        38 year old female with a history of provoked PE, HTN, HLD, and GERD presenting with episodic dizziness for the last week accompanied by an episode of palpitations and feeling "warm" all over earlier tonight.  She had some right-sided tinnitus in route, but this has  also resolved.  Patient was recently seen by cardiology and had a Holter monitor that was unremarkable.  She has been seen by endocrinology for a thyroid nodule, which was thought to be a variant of normal.   All of her symptoms have resolved at this time and EKG was not indicated.  Basic labs were obtained as she did not have any electrolyte abnormalities.  She is not having any chest pain and troponin was not ordered.  She is not having any pleuritic chest pain and she did previously have a PE, but it was provoked.  Low suspicion for PE.  Doubt ACS.  Of note, the patient reports that she did have a cerumen impaction previously that was causing dizziness.  On her exam, she has a bilateral cerumen impaction.  TSH was normal.  She does not appear dehydrated.  She is neurologically intact without focal deficits so doubt CVA.  At this time, I feel that there is no further urgent or emergent work-up indicated.  We will discharge the patient home with instructions to use over-the-counter cerumen removal kits.  If she is unable to remove cerumen in this method, she can follow-up with primary care.  Patient's blood pressure has been mildly elevated with systolic pressure in the 140s-150s, but I doubt this is the etiology of the tinnitus that she had in route that has resolved.  She has been compliant with her home blood pressure medications, and I have advised her to follow-up with primary care to have this rechecked.   She is hemodynamically stable and in no acute distress.  Safe for discharge home with outpatient follow-up at this time.  Final Clinical Impressions(s) / ED Diagnoses   Final diagnoses:  Dizziness  Palpitations  Bilateral impacted cerumen    ED Discharge Orders         Ordered    meclizine (ANTIVERT) 25 MG tablet  3 times daily PRN     01/14/19 0504           Barkley Boards, PA-C 01/14/19 1610    Dione Booze, MD 01/14/19 [phone]

## 2019-01-15 LAB — T3, FREE: T3, Free: 2.7 pg/mL (ref 2.0–4.4)

## 2019-03-10 ENCOUNTER — Encounter (HOSPITAL_COMMUNITY): Payer: Self-pay | Admitting: Family Medicine

## 2019-03-10 ENCOUNTER — Emergency Department (HOSPITAL_COMMUNITY)
Admission: EM | Admit: 2019-03-10 | Discharge: 2019-03-11 | Disposition: A | Discharge status: Home / Self Care | Payer: 59 | Attending: Emergency Medicine | Admitting: Emergency Medicine

## 2019-03-10 ENCOUNTER — Emergency Department (HOSPITAL_COMMUNITY): Payer: 59

## 2019-03-10 ENCOUNTER — Other Ambulatory Visit: Payer: Self-pay

## 2019-03-10 DIAGNOSIS — Z79899 Other long term (current) drug therapy: Secondary | ICD-10-CM | POA: Diagnosis not present

## 2019-03-10 DIAGNOSIS — R000 Tachycardia, unspecified: Secondary | ICD-10-CM | POA: Insufficient documentation

## 2019-03-10 DIAGNOSIS — R0602 Shortness of breath: Secondary | ICD-10-CM | POA: Insufficient documentation

## 2019-03-10 DIAGNOSIS — Z7982 Long term (current) use of aspirin: Secondary | ICD-10-CM | POA: Insufficient documentation

## 2019-03-10 DIAGNOSIS — R002 Palpitations: Secondary | ICD-10-CM | POA: Diagnosis present

## 2019-03-10 DIAGNOSIS — I10 Essential (primary) hypertension: Secondary | ICD-10-CM | POA: Diagnosis not present

## 2019-03-10 LAB — BASIC METABOLIC PANEL
Anion gap: 10 (ref 5–15)
BUN: 15 mg/dL (ref 6–20)
CO2: 24 mmol/L (ref 22–32)
Calcium: 8.8 mg/dL — ABNORMAL LOW (ref 8.9–10.3)
Chloride: 104 mmol/L (ref 98–111)
Creatinine, Ser: 0.73 mg/dL (ref 0.44–1.00)
GFR calc Af Amer: >60 mL/min (ref >60)
GFR calc non Af Amer: >60 mL/min (ref >60)
Glucose, Bld: 118 mg/dL — ABNORMAL HIGH (ref 70–99)
Potassium: 3.3 mmol/L — ABNORMAL LOW (ref 3.5–5.1)
Sodium: 138 mmol/L (ref 135–145)

## 2019-03-10 LAB — TSH: TSH: 1.432 u[IU]/mL (ref 0.350–4.500)

## 2019-03-10 LAB — TROPONIN I (HIGH SENSITIVITY): Troponin I (High Sensitivity): 2 ng/L (ref <18)

## 2019-03-10 LAB — CBC
HCT: 40.3 % (ref 36.0–46.0)
Hemoglobin: 13 g/dL (ref 12.0–15.0)
MCH: 29.7 pg (ref 26.0–34.0)
MCHC: 32.3 g/dL (ref 30.0–36.0)
MCV: 92 fL (ref 80.0–100.0)
Platelets: 279 10*3/uL (ref 150–400)
RBC: 4.38 MIL/uL (ref 3.87–5.11)
RDW: 14.4 % (ref 11.5–15.5)
WBC: 6.9 10*3/uL (ref 4.0–10.5)
nRBC: 0 % (ref 0.0–0.2)

## 2019-03-10 LAB — I-STAT BETA HCG BLOOD, ED (MC, WL, AP ONLY): I-stat hCG, quantitative: <5 m[IU]/mL (ref <5)

## 2019-03-10 MED ORDER — SODIUM CHLORIDE 0.9 % IV BOLUS
1000.0000 mL | Freq: Once | INTRAVENOUS | Status: AC
  Administered 2019-03-10: 1000 mL via INTRAVENOUS

## 2019-03-10 MED ORDER — LORAZEPAM 2 MG/ML IJ SOLN
0.5000 mg | Freq: Once | INTRAMUSCULAR | Status: DC
  Filled 2019-03-10: qty 1

## 2019-03-10 MED ORDER — SODIUM CHLORIDE 0.9% FLUSH
3.0000 mL | Freq: Once | INTRAVENOUS | Status: AC
  Administered 2019-03-10: 3 mL via INTRAVENOUS

## 2019-03-10 NOTE — ED Provider Notes (Signed)
[facility]-EMERGENCY DEPT Provider Note   CSN: [id number] Arrival date & time: 03/10/19  2049     History Chief Complaint  Patient presents with  . Palpitations  . Shortness of Breath    Mindy Wade is a 39 y.o. female.  39 yo F with a chief complaints of palpitations.  Started acutely while she was at work.  Never had this problem before.  Denies any recent medication changes denies fever denies chest pain denies cough shortness of breath.  She denies any nausea vomiting or diarrhea.  Denies abdominal pain.  Denies significant vaginal bleeding.  She denies any recent stimulant use or medication changes.  The history is provided by the patient.  Palpitations Palpitations quality:  Fast Onset quality:  Sudden Duration:  6 hours Timing:  Constant Progression:  Partially resolved Chronicity:  New Context: not caffeine, not dehydration, not exercise, not illicit drugs and not stimulant use   Relieved by:  Nothing Worsened by:  Nothing Ineffective treatments:  None tried Associated symptoms: shortness of breath   Associated symptoms: no chest pain, no dizziness, no nausea and no vomiting   Shortness of Breath Associated symptoms: no chest pain, no fever, no headaches, no vomiting and no wheezing        Past Medical History:  Diagnosis Date  . AMA (advanced maternal age) multigravida 35+   . Breast feeding status of mother   . GERD (gastroesophageal reflux disease)   . Headache   . Hx of blood clots   . Hx of varicella   . Hyperlipemia   . Hypertension    takes meds  . Pregnancy induced hypertension   . Pulmonary embolism [facility])     Patient Active Problem List   Diagnosis Date Noted  . Term pregnancy 04/25/2018  . Vaginal delivery 04/25/2018  . Adherent placenta 04/25/2018  . Missed abortion with fetal demise before 20 completed weeks of gestation 05/03/2017  . Precipitous delivery 02/19/2015  . Postpartum care following vaginal delivery  02/19/2015  . Active labor at term 02/18/2015  . Pre-diabetes 05/14/2014  . Elevated LDL cholesterol level 05/14/2014  . Pulmonary embolism (HCC) 10/17/2013  . Labor and delivery indication for care or intervention 10/09/2013  . Spontaneous abortion in second trimester 10/09/2013  . SVD (spontaneous vaginal delivery) 03/02/2013    Past Surgical History:  Procedure Laterality Date  . CHOLECYSTECTOMY       OB History    Gravida  9   Para  8   Term  7   Preterm  1   AB  1   Living  7     SAB  0   TAB  1   Ectopic      Multiple  0   Live Births  8           Family History  Problem Relation Age of Onset  . Asthma Mother   . Heart disease Mother   . Hypertension Mother   . Stroke Mother   . Cancer Father        prostate  . Alcohol abuse Neg Hx   . Arthritis Neg Hx   . Birth defects Neg Hx   . COPD Neg Hx   . Depression Neg Hx   . Diabetes Neg Hx   . Drug abuse Neg Hx   . Early death Neg Hx   . Hearing loss Neg Hx   . Hyperlipidemia Neg Hx   . Kidney disease Neg Hx   .  Learning disabilities Neg Hx   . Mental illness Neg Hx   . Mental retardation Neg Hx   . Miscarriages / Stillbirths Neg Hx   . Vision loss Neg Hx   . Varicose Veins Neg Hx     Social History   Tobacco Use  . Smoking status: Never Smoker  . Smokeless tobacco: Never Used  Substance Use Topics  . Alcohol use: No  . Drug use: No    Home Medications Prior to Admission medications   Medication Sig Start Date End Date Taking? Authorizing Provider  aspirin EC 81 MG tablet Take 81 mg by mouth daily.   Yes [provider]  losartan (COZAAR) 50 MG tablet Take 50 mg by mouth 2 (two) times daily.  06/03/18  Yes [provider]  Multiple Vitamin (MULTIVITAMIN WITH MINERALS) TABS tablet Take 1 tablet by mouth daily.   Yes [provider]  paragard intrauterine copper IUD IUD by Intrauterine route continuous. Since 01/2019.   Yes [provider]    meclizine (ANTIVERT) 25 MG tablet Take 1 tablet (25 mg total) by mouth 3 (three) times daily as needed for dizziness. Patient not taking: Reported on 03/10/2019 01/14/19   McDonald, Mia A, PA-C  enoxaparin (LOVENOX) 150 MG/ML injection Inject 0.73 mLs (110 mg total) into the skin daily. Patient not taking: Reported on 01/14/2019 09/22/18 01/14/19  Antonietta Breach, PA-C  omeprazole (PRILOSEC) 20 MG capsule Take 1 capsule (20 mg total) by mouth daily. Patient not taking: Reported on 09/22/2018 07/05/18 01/14/19  Carlisle Cater, PA-C    Allergies    Penicillins  Review of Systems   Review of Systems  Constitutional: Negative for chills and fever.  HENT: Negative for congestion and rhinorrhea.   Eyes: Negative for redness and visual disturbance.  Respiratory: Positive for shortness of breath. Negative for wheezing.   Cardiovascular: Positive for palpitations. Negative for chest pain.  Gastrointestinal: Negative for nausea and vomiting.  Genitourinary: Negative for dysuria and urgency.  Musculoskeletal: Negative for arthralgias and myalgias.  Skin: Negative for pallor and wound.  Neurological: Negative for dizziness and headaches.    Physical Exam Updated Vital Signs BP (!) 137/96   Pulse 85   Temp 98.6 F (37 C) (Oral)   Resp 20   Ht 5\' 10"  (1.778 m)   Wt 117.5 kg   LMP 03/08/2019   SpO2 99%   BMI 37.16 kg/m   Physical Exam Vitals and nursing note reviewed.  Constitutional:      General: She is not in acute distress.    Appearance: She is well-developed. She is not diaphoretic.  HENT:     Head: Normocephalic and atraumatic.  Eyes:     Pupils: Pupils are equal, round, and reactive to light.  Cardiovascular:     Rate and Rhythm: Regular rhythm. Tachycardia present.     Heart sounds: No murmur. No friction rub. No gallop.   Pulmonary:     Effort: Pulmonary effort is normal.     Breath sounds: No wheezing or rales.  Abdominal:     General: There is no distension.      Palpations: Abdomen is soft.     Tenderness: There is no abdominal tenderness.  Musculoskeletal:        General: No tenderness.     Cervical back: Normal range of motion and neck supple.  Skin:    General: Skin is warm and dry.  Neurological:     Mental Status: She is alert and oriented to  person, place, and time.  Psychiatric:        Behavior: Behavior normal.     ED Results / Procedures / Treatments   Labs (all labs ordered are listed, but only abnormal results are displayed) Labs Reviewed  BASIC METABOLIC PANEL - Abnormal; Notable for the following components:      Result Value   Potassium 3.3 (*)    Glucose, Bld 118 (*)    Calcium 8.8 (*)    All other components within normal limits  CBC  TSH  T4, FREE  I-STAT BETA HCG BLOOD, ED ([facility], [facility], [facility] ONLY)  TROPONIN I (HIGH SENSITIVITY)  TROPONIN I (HIGH SENSITIVITY)    EKG EKG Interpretation  Date/Time:  Tuesday March 10 2019 21:10:42 EST Ventricular Rate:  146 PR Interval:    QRS Duration: 90 QT Interval:  267 QTC Calculation: 416 R Axis:   75 Text Interpretation: Sinus tachycardia Low voltage with right axis deviation Consider anterior infarct Borderline ST depression, diffuse leads st changes likely rate related Otherwise no significant change Confirmed by Melene Plan [phone]) on 03/10/2019 9:56:46 PM   Radiology DG Chest 2 View  Result Date: 03/10/2019 CLINICAL DATA:  Palpitations, shortness of breath EXAM: CHEST - 2 VIEW COMPARISON:  11/27/2018 FINDINGS: Heart and mediastinal contours are within normal limits. No focal opacities or effusions. No acute bony abnormality. IMPRESSION: No active cardiopulmonary disease. Electronically Signed   By: Charlett Nose M.D.   On: 03/10/2019 21:45    Procedures Procedures (including critical care time)  Medications Ordered in ED Medications  LORazepam (ATIVAN) injection 0.5 mg (0.5 mg Intravenous Refused 03/10/19 2311)  sodium chloride flush (NS) 0.9 % injection 3 mL (3 mLs  Intravenous Given 03/10/19 2314)  sodium chloride 0.9 % bolus 1,000 mL (1,000 mLs Intravenous New Bag/Given 03/10/19 2313)    ED Course  I have reviewed the triage vital signs and the nursing notes.  Pertinent labs & imaging results that were available during my care of the patient were reviewed by me and considered in my medical decision making (see chart for details).    MDM Rules/Calculators/A&P                      39 yo F with a cc of palpitations.  Patient in sinus tachycardia on initial EKG.  Sinus tachycardia on the monitor.  No obvious explanation for this.  Seems less likely to be a PE without symptoms.  She is still however tachycardic into the 110 range.  Does not obviously seem to be anxious.  Hemoglobin is normal.  Will give a bolus of IV fluids half milligram Ativan reassess.  Tachycardia improved.  D/c home.   11:32 PM:  I have discussed the diagnosis/risks/treatment options with the patient and believe the pt to be eligible for discharge home to follow-up with PCP. We also discussed returning to the ED immediately if new or worsening sx occur. We discussed the sx which are most concerning (e.g., sudden worsening pain, fever, inability to tolerate by mouth) that necessitate immediate return. Medications administered to the patient during their visit and any new prescriptions provided to the patient are listed below.  Medications given during this visit Medications  LORazepam (ATIVAN) injection 0.5 mg (0.5 mg Intravenous Refused 03/10/19 2311)  sodium chloride flush (NS) 0.9 % injection 3 mL (3 mLs Intravenous Given 03/10/19 2314)  sodium chloride 0.9 % bolus 1,000 mL (1,000 mLs Intravenous New Bag/Given 03/10/19 2313)     The  patient appears reasonably screen and/or stabilized for discharge and I doubt any other medical condition or other [facility] requiring further screening, evaluation, or treatment in the ED at this time prior to discharge.   Final Clinical Impression(s) / ED  Diagnoses Final diagnoses:  Sinus tachycardia  Heart palpitations    Rx / DC Orders ED Discharge Orders    None       Melene Plan, DO 03/10/19 2332

## 2019-03-10 NOTE — ED Notes (Signed)
Pt ambulated to RR without assistance.  Gait was steady. 

## 2019-03-10 NOTE — ED Triage Notes (Signed)
Patient states while at work she started experiencing palpitations. They have continued for about 45 minutes. Also, she noticed she became "short winded" while walking from her car to the ED. Patient is breathing regular, unlabored.

## 2019-03-10 NOTE — Discharge Instructions (Signed)
Call your doctor tomorrow and let them know about your visit here.  Please return for worsening or persistent symptoms.  Try to avoid stimulants such as caffeine or diet supplements.  Eat and drink well for the next 48 hours.

## 2019-03-11 LAB — TROPONIN I (HIGH SENSITIVITY): Troponin I (High Sensitivity): 3 ng/L (ref <18)

## 2019-03-11 LAB — T4, FREE: Free T4: 0.89 ng/dL (ref 0.61–1.12)

## 2019-03-25 ENCOUNTER — Ambulatory Visit: Payer: 59 | Attending: Internal Medicine

## 2019-03-25 DIAGNOSIS — Z20822 Contact with and (suspected) exposure to covid-19: Secondary | ICD-10-CM

## 2019-03-26 LAB — NOVEL CORONAVIRUS, NAA: SARS-CoV-2, NAA: NOT DETECTED

## 2019-04-23 ENCOUNTER — Ambulatory Visit (HOSPITAL_COMMUNITY)
Admission: EM | Admit: 2019-04-23 | Discharge: 2019-04-23 | Disposition: A | Discharge status: Home / Self Care | Payer: 59 | Attending: Family Medicine | Admitting: Family Medicine

## 2019-04-23 ENCOUNTER — Ambulatory Visit (INDEPENDENT_AMBULATORY_CARE_PROVIDER_SITE_OTHER): Payer: 59

## 2019-04-23 ENCOUNTER — Other Ambulatory Visit: Payer: Self-pay

## 2019-04-23 ENCOUNTER — Encounter (HOSPITAL_COMMUNITY): Payer: Self-pay | Admitting: Family Medicine

## 2019-04-23 DIAGNOSIS — M7582 Other shoulder lesions, left shoulder: Secondary | ICD-10-CM | POA: Diagnosis not present

## 2019-04-23 MED ORDER — PREDNISONE 10 MG (21) PO TBPK: ORAL_TABLET | ORAL | 0 refills | Status: DC

## 2019-04-23 NOTE — ED Triage Notes (Signed)
Pt state she has left shoulder pain x 1 month her more. Pt states she does a lot of lifting.

## 2019-04-23 NOTE — Discharge Instructions (Signed)
Nothing concerning on x-ray.  I believe this is rotator cuff tendinitis. Take the prednisone as prescribed take it with food. Rest the shoulder is much as possible. You can put some ice on the area If your symptoms continue you will need to follow-up with orthopedic specialist.

## 2019-04-23 NOTE — ED Provider Notes (Signed)
[facility]    CSN: [id number] Arrival date & time: 04/23/19  1108      History   Chief Complaint Chief Complaint  Patient presents with  . Shoulder Pain    HPI Mindy Wade is a 39 y.o. female.   Patient is a 39 year old female who presents today with left shoulder pain.  This has been a constant problem for over the past month or more.  Reporting increasingly worse over the past week or so.  Worse with certain use of the shoulder and rotation.  Does a lot of heavy lifting at work and repetitive movements with her upper arms.  Denies any falls or injuries.  Denies any numbness, tingling or weakness of the arm.  ROS per HPI    Shoulder Pain   Past Medical History:  Diagnosis Date  . AMA (advanced maternal age) multigravida 35+   . Breast feeding status of mother   . GERD (gastroesophageal reflux disease)   . Headache   . Hx of blood clots   . Hx of varicella   . Hyperlipemia   . Hypertension    takes meds  . Pregnancy induced hypertension   . Pulmonary embolism [facility])     Patient Active Problem List   Diagnosis Date Noted  . Term pregnancy 04/25/2018  . Vaginal delivery 04/25/2018  . Adherent placenta 04/25/2018  . Missed abortion with fetal demise before 20 completed weeks of gestation 05/03/2017  . Precipitous delivery 02/19/2015  . Postpartum care following vaginal delivery 02/19/2015  . Active labor at term 02/18/2015  . Pre-diabetes 05/14/2014  . Elevated LDL cholesterol level 05/14/2014  . Pulmonary embolism (HCC) 10/17/2013  . Labor and delivery indication for care or intervention 10/09/2013  . Spontaneous abortion in second trimester 10/09/2013  . SVD (spontaneous vaginal delivery) 03/02/2013    Past Surgical History:  Procedure Laterality Date  . CHOLECYSTECTOMY      OB History    Gravida  9   Para  8   Term  7   Preterm  1   AB  1   Living  7     SAB  0   TAB  1   Ectopic      Multiple  0   Live Births  8              Home Medications    Prior to Admission medications   Medication Sig Start Date End Date Taking? Authorizing Provider  aspirin EC 81 MG tablet Take 81 mg by mouth daily.    [provider]  losartan (COZAAR) 50 MG tablet Take 50 mg by mouth 2 (two) times daily.  06/03/18   [provider]  meclizine (ANTIVERT) 25 MG tablet Take 1 tablet (25 mg total) by mouth 3 (three) times daily as needed for dizziness. Patient not taking: Reported on 03/10/2019 01/14/19   McDonald, Pedro Earls A, PA-C  Multiple Vitamin (MULTIVITAMIN WITH MINERALS) TABS tablet Take 1 tablet by mouth daily.    [provider]  paragard intrauterine copper IUD IUD by Intrauterine route continuous. Since 01/2019.    [provider]  predniSONE (STERAPRED UNI-PAK 21 TAB) 10 MG (21) TBPK tablet 6 tabs for 1 day, then 5 tabs for 1 das, then 4 tabs for 1 day, then 3 tabs for 1 day, 2 tabs for 1 day, then 1 tab for 1 day 04/23/19   Dahlia Byes A, NP  enoxaparin (LOVENOX) 150 MG/ML injection Inject 0.73 mLs (110  mg total) into the skin daily. Patient not taking: Reported on 01/14/2019 09/22/18 01/14/19  Antony Madura, PA-C  omeprazole (PRILOSEC) 20 MG capsule Take 1 capsule (20 mg total) by mouth daily. Patient not taking: Reported on 09/22/2018 07/05/18 01/14/19  Renne Crigler, PA-C    Family History Family History  Problem Relation Age of Onset  . Asthma Mother   . Heart disease Mother   . Hypertension Mother   . Stroke Mother   . Cancer Father        prostate  . Alcohol abuse Neg Hx   . Arthritis Neg Hx   . Birth defects Neg Hx   . COPD Neg Hx   . Depression Neg Hx   . Diabetes Neg Hx   . Drug abuse Neg Hx   . Early death Neg Hx   . Hearing loss Neg Hx   . Hyperlipidemia Neg Hx   . Kidney disease Neg Hx   . Learning disabilities Neg Hx   . Mental illness Neg Hx   . Mental retardation Neg Hx   . Miscarriages / Stillbirths Neg Hx   . Vision loss Neg Hx   . Varicose Veins Neg Hx      Social History Social History   Tobacco Use  . Smoking status: Never Smoker  . Smokeless tobacco: Never Used  Substance Use Topics  . Alcohol use: No  . Drug use: No     Allergies   Penicillins   Review of Systems Review of Systems   Physical Exam Triage Vital Signs ED Triage Vitals  Enc Vitals Group     BP 04/23/19 1149 132/85     Pulse Rate 04/23/19 1149 92     Resp 04/23/19 1149 18     Temp 04/23/19 1149 98.7 F (37.1 C)     Temp Source 04/23/19 1149 Oral     SpO2 04/23/19 1149 99 %     Weight 04/23/19 1152 256 lb (116.1 kg)     Height --      Head Circumference --      Peak Flow --      Pain Score 04/23/19 1151 4     Pain Loc --      Pain Edu? --      Excl. in GC? --    No data found.  Updated Vital Signs BP 132/85 (BP Location: Right Arm)   Pulse 92   Temp 98.7 F (37.1 C) (Oral)   Resp 18   Wt 256 lb (116.1 kg)   LMP 04/03/2019   SpO2 99%   BMI 36.73 kg/m   Visual Acuity Right Eye Distance:   Left Eye Distance:   Bilateral Distance:    Right Eye Near:   Left Eye Near:    Bilateral Near:     Physical Exam Vitals and nursing note reviewed.  Constitutional:      General: She is not in acute distress.    Appearance: Normal appearance. She is not ill-appearing, toxic-appearing or diaphoretic.  HENT:     Head: Normocephalic.     Nose: Nose normal.  Eyes:     Conjunctiva/sclera: Conjunctivae normal.  Pulmonary:     Effort: Pulmonary effort is normal.  Musculoskeletal:     Left shoulder: Tenderness present. No swelling, deformity, effusion, laceration or bony tenderness. Decreased range of motion. Normal strength. Normal pulse.       Arms:     Cervical back: Normal range of motion.     Comments:  TTP to left shoulder joint Limited rotation of the shoulder.   Skin:    General: Skin is warm and dry.     Findings: No rash.  Neurological:     Mental Status: She is alert.  Psychiatric:        Mood and Affect: Mood normal.       UC Treatments / Results  Labs (all labs ordered are listed, but only abnormal results are displayed) Labs Reviewed - No data to display  EKG   Radiology DG Shoulder Left  Result Date: 04/23/2019 CLINICAL DATA:  Left shoulder pain x last couple months, worsening over last few days. NKI. States she does a lot of repetitive movement and lifting at her job. SRP shoulder pain x 1 month EXAM: LEFT SHOULDER - 2+ VIEW COMPARISON:  None. FINDINGS: Glenohumeral joint is intact. No evidence of scapular fracture or humeral fracture. The acromioclavicular joint is intact. IMPRESSION: No fracture or dislocation. Electronically Signed   By: Suzy Bouchard M.D.   On: 04/23/2019 12:46    Procedures Procedures (including critical care time)  Medications Ordered in UC Medications - No data to display  Initial Impression / Assessment and Plan / UC Course  I have reviewed the triage vital signs and the nursing notes.  Pertinent labs & imaging results that were available during my care of the patient were reviewed by me and considered in my medical decision making (see chart for details).  Clinical Course as of Apr 22 1304  Thu Apr 23, 2019  1253 DG Shoulder Left [TB]    Clinical Course User Index [TB] Loura Halt A, NP    Rotator cuff tendinitis.-Most likely diagnosis based on symptoms, exam and history. X-ray negative for any acute abnormalities. Will treat with prednisone taper Recommended rest, ice Work note given for light duty Orthopedic follow-up for any continued or worsening problems. Final Clinical Impressions(s) / UC Diagnoses   Final diagnoses:  Rotator cuff tendinitis, left     Discharge Instructions     Nothing concerning on x-ray.  I believe this is rotator cuff tendinitis. Take the prednisone as prescribed take it with food. Rest the shoulder is much as possible. You can put some ice on the area If your symptoms continue you will need to follow-up with  orthopedic specialist.    ED Prescriptions    Medication Sig Dispense Auth. Provider   predniSONE (STERAPRED UNI-PAK 21 TAB) 10 MG (21) TBPK tablet 6 tabs for 1 day, then 5 tabs for 1 das, then 4 tabs for 1 day, then 3 tabs for 1 day, 2 tabs for 1 day, then 1 tab for 1 day 21 tablet Pearline Yerby A, NP     PDMP not reviewed this encounter.   Orvan July, NP 04/23/19 1306

## 2019-05-21 ENCOUNTER — Ambulatory Visit (HOSPITAL_COMMUNITY)
Admission: EM | Admit: 2019-05-21 | Discharge: 2019-05-21 | Disposition: A | Discharge status: Home / Self Care | Payer: 59 | Attending: Emergency Medicine | Admitting: Emergency Medicine

## 2019-05-21 ENCOUNTER — Encounter (HOSPITAL_COMMUNITY): Payer: Self-pay

## 2019-05-21 ENCOUNTER — Other Ambulatory Visit: Payer: Self-pay

## 2019-05-21 DIAGNOSIS — H6122 Impacted cerumen, left ear: Secondary | ICD-10-CM | POA: Insufficient documentation

## 2019-05-21 DIAGNOSIS — R42 Dizziness and giddiness: Secondary | ICD-10-CM | POA: Diagnosis not present

## 2019-05-21 LAB — BASIC METABOLIC PANEL
Anion gap: 7 (ref 5–15)
BUN: 9 mg/dL (ref 6–20)
CO2: 25 mmol/L (ref 22–32)
Calcium: 9.1 mg/dL (ref 8.9–10.3)
Chloride: 105 mmol/L (ref 98–111)
Creatinine, Ser: 0.64 mg/dL (ref 0.44–1.00)
GFR calc Af Amer: >60 mL/min (ref >60)
GFR calc non Af Amer: >60 mL/min (ref >60)
Glucose, Bld: 106 mg/dL — ABNORMAL HIGH (ref 70–99)
Potassium: 4 mmol/L (ref 3.5–5.1)
Sodium: 137 mmol/L (ref 135–145)

## 2019-05-21 LAB — CBC
HCT: 40.8 % (ref 36.0–46.0)
Hemoglobin: 13.2 g/dL (ref 12.0–15.0)
MCH: 29.9 pg (ref 26.0–34.0)
MCHC: 32.4 g/dL (ref 30.0–36.0)
MCV: 92.3 fL (ref 80.0–100.0)
Platelets: 272 10*3/uL (ref 150–400)
RBC: 4.42 MIL/uL (ref 3.87–5.11)
RDW: 13.7 % (ref 11.5–15.5)
WBC: 5.9 10*3/uL (ref 4.0–10.5)
nRBC: 0 % (ref 0.0–0.2)

## 2019-05-21 MED ORDER — ONDANSETRON 4 MG PO TBDP: 4.0000 mg | ORAL_TABLET | Freq: Three times a day (TID) | ORAL | 0 refills | Status: DC | PRN

## 2019-05-21 MED ORDER — MECLIZINE HCL 25 MG PO TABS: 25.0000 mg | ORAL_TABLET | Freq: Three times a day (TID) | ORAL | 0 refills | Status: DC | PRN

## 2019-05-21 NOTE — ED Notes (Signed)
Left ear cerumen removal completed; pt tolerated well; c/o some mild dizziness/pressure during removal. After completed, pt states dizziness improved by 50%.

## 2019-05-21 NOTE — Discharge Instructions (Addendum)
EKG normal Continue to monitor blood pressure Drink plenty of fluids No sudden movements Meclizine as needed for dizziness/nausea-may cause drowsiness, do not drive or work after taking Zofran as needed for nausea, nondrowsy medicine for nausea May try over-the-counter Debrox for earwax  If developing increased headaches, vision changes, dizziness, lightheadedness, chest pain, shortness of breath, difficulty speaking or weakness please follow-up in the emergency room

## 2019-05-21 NOTE — ED Triage Notes (Signed)
Pt c/o dizziness for approx 4 days; upper back and neck pain onset last night and nausea onset this morning. Denies cough, congestion, blurred vision or changes to vision, denies parasthesias or difficulty ambulating, denies CP or SOB.  Smile symmetrical, grips equal/strong; leg lift equal/strong against resistance.  H/o of PE that resolved with anticoagulation therapy in 2015.

## 2019-05-22 NOTE — ED Provider Notes (Signed)
[facility]    CSN: [id number] Arrival date & time: 05/21/19  1329      History   Chief Complaint Chief Complaint  Patient presents with  . Dizziness    HPI David Towson is a 39 y.o. female history of hypertension, GERD, prior PE, presenting today for evaluation of dizziness.  Patient notes that over the past 4 days she has had episodic dizziness.  She has a sensation of being off balance.  Has noticed this frequently with changing positions.  Symptoms will last for a few minutes then subside.  The past 24 hours she has also developed associated nausea as well as discomfort in her upper back and neck.  She denies any vision changes.  Has had some occasional mild headaches.  Denies difficulty speaking or weakness.  Denies chest pain or shortness of breath.  Does report history of dizziness with prior cerumen impaction.  Is no longer on anticoagulants from prior PE.  Denies feeling similar to prior PE.  Denies leg pain or leg swelling.  HPI  Past Medical History:  Diagnosis Date  . AMA (advanced maternal age) multigravida 35+   . Breast feeding status of mother   . GERD (gastroesophageal reflux disease)   . Headache   . Hx of blood clots   . Hx of varicella   . Hyperlipemia   . Hypertension    takes meds  . Pregnancy induced hypertension   . Pulmonary embolism [facility])     Patient Active Problem List   Diagnosis Date Noted  . Term pregnancy 04/25/2018  . Vaginal delivery 04/25/2018  . Adherent placenta 04/25/2018  . Missed abortion with fetal demise before 20 completed weeks of gestation 05/03/2017  . Precipitous delivery 02/19/2015  . Postpartum care following vaginal delivery 02/19/2015  . Active labor at term 02/18/2015  . Pre-diabetes 05/14/2014  . Elevated LDL cholesterol level 05/14/2014  . Pulmonary embolism (HCC) 10/17/2013  . Labor and delivery indication for care or intervention 10/09/2013  . Spontaneous abortion in second trimester 10/09/2013  .  SVD (spontaneous vaginal delivery) 03/02/2013    Past Surgical History:  Procedure Laterality Date  . CHOLECYSTECTOMY      OB History    Gravida  9   Para  8   Term  7   Preterm  1   AB  1   Living  7     SAB  0   TAB  1   Ectopic      Multiple  0   Live Births  8            Home Medications    Prior to Admission medications   Medication Sig Start Date End Date Taking? Authorizing Provider  aspirin EC 81 MG tablet Take 81 mg by mouth daily.   Yes [provider]  losartan (COZAAR) 50 MG tablet Take 50 mg by mouth 2 (two) times daily.  06/03/18  Yes [provider]  Multiple Vitamin (MULTIVITAMIN WITH MINERALS) TABS tablet Take 1 tablet by mouth daily.   Yes [provider]  paragard intrauterine copper IUD IUD by Intrauterine route continuous. Since 01/2019.   Yes [provider]  meclizine (ANTIVERT) 25 MG tablet Take 1 tablet (25 mg total) by mouth 3 (three) times daily as needed for dizziness or nausea. 05/21/19   Edlin Ford C, PA-C  ondansetron (ZOFRAN ODT) 4 MG disintegrating tablet Take 1 tablet (4 mg total) by mouth every 8 (eight) hours as needed  for nausea or vomiting. 05/21/19   Brissa Asante C, PA-C  enoxaparin (LOVENOX) 150 MG/ML injection Inject 0.73 mLs (110 mg total) into the skin daily. Patient not taking: Reported on 01/14/2019 09/22/18 01/14/19  Antony Madura, PA-C  omeprazole (PRILOSEC) 20 MG capsule Take 1 capsule (20 mg total) by mouth daily. Patient not taking: Reported on 09/22/2018 07/05/18 01/14/19  Renne Crigler, PA-C    Family History Family History  Problem Relation Age of Onset  . Asthma Mother   . Heart disease Mother   . Hypertension Mother   . Stroke Mother   . Cancer Father        prostate  . Alcohol abuse Neg Hx   . Arthritis Neg Hx   . Birth defects Neg Hx   . COPD Neg Hx   . Depression Neg Hx   . Diabetes Neg Hx   . Drug abuse Neg Hx   . Early death Neg Hx   . Hearing loss Neg  Hx   . Hyperlipidemia Neg Hx   . Kidney disease Neg Hx   . Learning disabilities Neg Hx   . Mental illness Neg Hx   . Mental retardation Neg Hx   . Miscarriages / Stillbirths Neg Hx   . Vision loss Neg Hx   . Varicose Veins Neg Hx     Social History Social History   Tobacco Use  . Smoking status: Never Smoker  . Smokeless tobacco: Never Used  Substance Use Topics  . Alcohol use: No  . Drug use: No     Allergies   Penicillins   Review of Systems Review of Systems  Constitutional: Negative for fatigue and fever.  HENT: Negative for congestion, sinus pressure and sore throat.   Eyes: Negative for photophobia, pain and visual disturbance.  Respiratory: Negative for cough and shortness of breath.   Cardiovascular: Negative for chest pain.  Gastrointestinal: Positive for nausea. Negative for abdominal pain and vomiting.  Genitourinary: Negative for decreased urine volume and hematuria.  Musculoskeletal: Positive for myalgias. Negative for neck pain and neck stiffness.  Neurological: Positive for dizziness and headaches. Negative for syncope, facial asymmetry, speech difficulty, weakness, light-headedness and numbness.     Physical Exam Triage Vital Signs ED Triage Vitals  Enc Vitals Group     BP 05/21/19 1357 (!) 151/114     Pulse Rate 05/21/19 1357 89     Resp 05/21/19 1357 16     Temp 05/21/19 1357 99 F (37.2 C)     Temp Source 05/21/19 1357 Oral     SpO2 05/21/19 1357 99 %     Weight --      Height --      Head Circumference --      Peak Flow --      Pain Score 05/21/19 1406 0     Pain Loc --      Pain Edu? --      Excl. in GC? --    Orthostatic VS for the past 24 hrs:  BP- Lying Pulse- Lying BP- Sitting Pulse- Sitting BP- Standing at 0 minutes Pulse- Standing at 0 minutes  05/21/19 1518 147/87 87 (!) 149/100 84 (!) 166/130 92    Updated Vital Signs BP (!) 151/114 (BP Location: Left Arm)   Pulse 89   Temp 99 F (37.2 C) (Oral)   Resp 16   LMP  05/01/2019   SpO2 99%   Visual Acuity Right Eye Distance:   Left Eye Distance:  Bilateral Distance:    Right Eye Near:   Left Eye Near:    Bilateral Near:     Physical Exam Vitals and nursing note reviewed.  Constitutional:      General: She is not in acute distress.    Appearance: She is well-developed.  HENT:     Head: Normocephalic and atraumatic.     Ears:     Comments: Cerumen impaction noted in left canal, small amount of cerumen in right canal  Cerumen impaction partially improved on left    Mouth/Throat:     Comments: Oral mucosa pink and moist, no tonsillar enlargement or exudate. Posterior pharynx patent and nonerythematous, no uvula deviation or swelling. Normal phonation. Palate elevates symmetrically Eyes:     Extraocular Movements: Extraocular movements intact.     Conjunctiva/sclera: Conjunctivae normal.     Pupils: Pupils are equal, round, and reactive to light.  Cardiovascular:     Rate and Rhythm: Normal rate and regular rhythm.     Heart sounds: No murmur.     Comments: No carotid bruits auscultated Pulmonary:     Effort: Pulmonary effort is normal. No respiratory distress.     Breath sounds: Normal breath sounds.     Comments: Breathing comfortably at rest, CTABL, no wheezing, rales or other adventitious sounds auscultated  Abdominal:     Palpations: Abdomen is soft.     Tenderness: There is no abdominal tenderness.  Musculoskeletal:     Cervical back: Neck supple.  Skin:    General: Skin is warm and dry.  Neurological:     General: No focal deficit present.     Mental Status: She is alert and oriented to person, place, and time.     Comments: Patient A&O x3, cranial nerves II-XII grossly intact, strength at shoulders, hips and knees 5/5, equal bilaterally, patellar reflex 1+ bilaterally. Negative Romberg. Gait without abnormality. Negative Dix-Hallpike      UC Treatments / Results  Labs (all labs ordered are listed, but only abnormal  results are displayed) Labs Reviewed  BASIC METABOLIC PANEL - Abnormal; Notable for the following components:      Result Value   Glucose, Bld 106 (*)    All other components within normal limits  CBC    EKG   Radiology No results found.  Procedures Procedures (including critical care time)  Medications Ordered in UC Medications - No data to display  Initial Impression / Assessment and Plan / UC Course  I have reviewed the triage vital signs and the nursing notes.  Pertinent labs & imaging results that were available during my care of the patient were reviewed by me and considered in my medical decision making (see chart for details).     EKG normal sinus rhythm, no acute signs of ischemia or infarction, negative orthostatics, will check BMP and CBC to evaluate for further causes of dizziness.  Symptoms not classic vertigo, but no neuro deficits noted on exam, do not suspect intracranial cause at this time.  Discussed general precautions, will provide meclizine for trial, Zofran for nausea, follow-up with PCP if symptoms persisting.  Cerumen impaction partially improved after irrigation from nursing staff.  Advised may try using Debrox to further remove impaction/cerumen.  Discussed strict return precautions. Patient verbalized understanding and is agreeable with plan.  Final Clinical Impressions(s) / UC Diagnoses   Final diagnoses:  Dizziness  Impacted cerumen of left ear     Discharge Instructions     EKG normal Continue to monitor  blood pressure Drink plenty of fluids No sudden movements Meclizine as needed for dizziness/nausea-may cause drowsiness, do not drive or work after taking Zofran as needed for nausea, nondrowsy medicine for nausea May try over-the-counter Debrox for earwax  If developing increased headaches, vision changes, dizziness, lightheadedness, chest pain, shortness of breath, difficulty speaking or weakness please follow-up in the emergency room    ED Prescriptions    Medication Sig Dispense Auth. Provider   meclizine (ANTIVERT) 25 MG tablet Take 1 tablet (25 mg total) by mouth 3 (three) times daily as needed for dizziness or nausea. 30 tablet Hurman Ketelsen C, PA-C   ondansetron (ZOFRAN ODT) 4 MG disintegrating tablet Take 1 tablet (4 mg total) by mouth every 8 (eight) hours as needed for nausea or vomiting. 20 tablet Deion Forgue, [place] C, PA-C     PDMP not reviewed this encounter.   Duff Pozzi, [place] C, PA-C 05/22/19 1022

## 2019-05-23 ENCOUNTER — Other Ambulatory Visit: Payer: Self-pay

## 2019-05-23 ENCOUNTER — Encounter (HOSPITAL_COMMUNITY): Payer: Self-pay | Admitting: *Deleted

## 2019-05-23 ENCOUNTER — Emergency Department (HOSPITAL_COMMUNITY)
Admission: EM | Admit: 2019-05-23 | Discharge: 2019-05-23 | Disposition: A | Discharge status: Home / Self Care | Payer: 59 | Attending: Emergency Medicine | Admitting: Emergency Medicine

## 2019-05-23 DIAGNOSIS — Z79899 Other long term (current) drug therapy: Secondary | ICD-10-CM | POA: Insufficient documentation

## 2019-05-23 DIAGNOSIS — R42 Dizziness and giddiness: Secondary | ICD-10-CM | POA: Insufficient documentation

## 2019-05-23 DIAGNOSIS — R519 Headache, unspecified: Secondary | ICD-10-CM | POA: Diagnosis not present

## 2019-05-23 DIAGNOSIS — Z7982 Long term (current) use of aspirin: Secondary | ICD-10-CM | POA: Insufficient documentation

## 2019-05-23 DIAGNOSIS — R110 Nausea: Secondary | ICD-10-CM | POA: Diagnosis not present

## 2019-05-23 DIAGNOSIS — I10 Essential (primary) hypertension: Secondary | ICD-10-CM | POA: Insufficient documentation

## 2019-05-23 LAB — CBC WITH DIFFERENTIAL/PLATELET
Abs Immature Granulocytes: 0.01 10*3/uL (ref 0.00–0.07)
Basophils Absolute: 0 10*3/uL (ref 0.0–0.1)
Basophils Relative: 0 %
Eosinophils Absolute: 0.1 10*3/uL (ref 0.0–0.5)
Eosinophils Relative: 1 %
HCT: 40.7 % (ref 36.0–46.0)
Hemoglobin: 13.1 g/dL (ref 12.0–15.0)
Immature Granulocytes: 0 %
Lymphocytes Relative: 40 %
Lymphs Abs: 2.7 10*3/uL (ref 0.7–4.0)
MCH: 29.7 pg (ref 26.0–34.0)
MCHC: 32.2 g/dL (ref 30.0–36.0)
MCV: 92.3 fL (ref 80.0–100.0)
Monocytes Absolute: 0.7 10*3/uL (ref 0.1–1.0)
Monocytes Relative: 10 %
Neutro Abs: 3.3 10*3/uL (ref 1.7–7.7)
Neutrophils Relative %: 49 %
Platelets: 274 10*3/uL (ref 150–400)
RBC: 4.41 MIL/uL (ref 3.87–5.11)
RDW: 13.8 % (ref 11.5–15.5)
WBC: 6.7 10*3/uL (ref 4.0–10.5)
nRBC: 0 % (ref 0.0–0.2)

## 2019-05-23 LAB — COMPREHENSIVE METABOLIC PANEL
ALT: 14 U/L (ref 0–44)
AST: 17 U/L (ref 15–41)
Albumin: 3.8 g/dL (ref 3.5–5.0)
Alkaline Phosphatase: 69 U/L (ref 38–126)
Anion gap: 9 (ref 5–15)
BUN: 16 mg/dL (ref 6–20)
CO2: 21 mmol/L — ABNORMAL LOW (ref 22–32)
Calcium: 8.7 mg/dL — ABNORMAL LOW (ref 8.9–10.3)
Chloride: 107 mmol/L (ref 98–111)
Creatinine, Ser: 0.61 mg/dL (ref 0.44–1.00)
GFR calc Af Amer: >60 mL/min (ref >60)
GFR calc non Af Amer: >60 mL/min (ref >60)
Glucose, Bld: 91 mg/dL (ref 70–99)
Potassium: 3.7 mmol/L (ref 3.5–5.1)
Sodium: 137 mmol/L (ref 135–145)
Total Bilirubin: 0.3 mg/dL (ref 0.3–1.2)
Total Protein: 7.8 g/dL (ref 6.5–8.1)

## 2019-05-23 MED ORDER — ONDANSETRON 4 MG PO TBDP: 4.0000 mg | ORAL_TABLET | Freq: Three times a day (TID) | ORAL | 0 refills | Status: DC | PRN

## 2019-05-23 NOTE — ED Provider Notes (Signed)
[facility]-EMERGENCY DEPT Provider Note   CSN: [id number] Arrival date & time: 05/23/19  1520     History Chief Complaint  Patient presents with  . Headache  . Lightedness    Mindy Wade is a 39 y.o. female.  HPI Patient is a 39 year old female with a history of headaches, PE several years ago-was within a week of her delivering her child.  Patient states that she is currently asymptomatic.  Patient states that for the past 1 week she has had lightheadedness generally occurs when she stands up.  She states that it was when she sits down and immediately she will feel better.  She states that she also feels like her symptoms get better if she is doing something takes her mind off it.  She denies any weakness, vertiginous symptoms, nausea, vomiting.  She also endorses some intermittent headaches however she states that this is unchanged from her usual headaches which she has had intermittently for years.  Patient states that she has been eating and drinking normally and states that she is well-hydrated.  She states that she had 1 episode of nausea 2 days ago however this only lasted about 30 minutes.  And then resolved without intervention.  She states that she was seen at urgent care recently and was told that likely due to cerumen impaction.  However she states she has no hearing issues, tenderness, ear discharge or ear pain at her visit today.    Patient denies any chest pain, shortness of breath, states that her symptoms feel all similar to prior PE.   Past Medical History:  Diagnosis Date  . AMA (advanced maternal age) multigravida 35+   . Breast feeding status of mother   . GERD (gastroesophageal reflux disease)   . Headache   . Hx of blood clots   . Hx of varicella   . Hyperlipemia   . Hypertension    takes meds  . Pregnancy induced hypertension   . Pulmonary embolism [facility])     Patient Active Problem List   Diagnosis Date Noted  . Term  pregnancy 04/25/2018  . Vaginal delivery 04/25/2018  . Adherent placenta 04/25/2018  . Missed abortion with fetal demise before 20 completed weeks of gestation 05/03/2017  . Precipitous delivery 02/19/2015  . Postpartum care following vaginal delivery 02/19/2015  . Active labor at term 02/18/2015  . Pre-diabetes 05/14/2014  . Elevated LDL cholesterol level 05/14/2014  . Pulmonary embolism (HCC) 10/17/2013  . Labor and delivery indication for care or intervention 10/09/2013  . Spontaneous abortion in second trimester 10/09/2013  . SVD (spontaneous vaginal delivery) 03/02/2013    Past Surgical History:  Procedure Laterality Date  . CHOLECYSTECTOMY       OB History    Gravida  9   Para  8   Term  7   Preterm  1   AB  1   Living  7     SAB  0   TAB  1   Ectopic      Multiple  0   Live Births  8           Family History  Problem Relation Age of Onset  . Asthma Mother   . Heart disease Mother   . Hypertension Mother   . Stroke Mother   . Cancer Father        prostate  . Alcohol abuse Neg Hx   . Arthritis Neg Hx   . Birth defects Neg Hx   .  COPD Neg Hx   . Depression Neg Hx   . Diabetes Neg Hx   . Drug abuse Neg Hx   . Early death Neg Hx   . Hearing loss Neg Hx   . Hyperlipidemia Neg Hx   . Kidney disease Neg Hx   . Learning disabilities Neg Hx   . Mental illness Neg Hx   . Mental retardation Neg Hx   . Miscarriages / Stillbirths Neg Hx   . Vision loss Neg Hx   . Varicose Veins Neg Hx     Social History   Tobacco Use  . Smoking status: Never Smoker  . Smokeless tobacco: Never Used  Substance Use Topics  . Alcohol use: No  . Drug use: No    Home Medications Prior to Admission medications   Medication Sig Start Date End Date Taking? Authorizing Provider  aspirin EC 81 MG tablet Take 81 mg by mouth daily.    [provider]  losartan (COZAAR) 50 MG tablet Take 50 mg by mouth 2 (two) times daily.  06/03/18   [provider]  meclizine (ANTIVERT) 25 MG tablet Take 1 tablet (25 mg total) by mouth 3 (three) times daily as needed for dizziness or nausea. 05/21/19   Wieters, Hallie C, PA-C  Multiple Vitamin (MULTIVITAMIN WITH MINERALS) TABS tablet Take 1 tablet by mouth daily.    [provider]  ondansetron (ZOFRAN ODT) 4 MG disintegrating tablet Take 1 tablet (4 mg total) by mouth every 8 (eight) hours as needed for nausea or vomiting. 05/23/19   Tedd Sias, PA  paragard intrauterine copper IUD IUD by Intrauterine route continuous. Since 01/2019.    [provider]  enoxaparin (LOVENOX) 150 MG/ML injection Inject 0.73 mLs (110 mg total) into the skin daily. Patient not taking: Reported on 01/14/2019 09/22/18 01/14/19  Antonietta Breach, PA-C  omeprazole (PRILOSEC) 20 MG capsule Take 1 capsule (20 mg total) by mouth daily. Patient not taking: Reported on 09/22/2018 07/05/18 01/14/19  Carlisle Cater, PA-C    Allergies    Penicillins  Review of Systems   Review of Systems  Constitutional: Negative for chills and fever.  HENT: Negative for congestion.   Eyes: Negative for pain.  Respiratory: Negative for cough and shortness of breath.   Cardiovascular: Negative for chest pain and leg swelling.  Gastrointestinal: Negative for abdominal pain and vomiting.  Genitourinary: Negative for dysuria.  Musculoskeletal: Negative for myalgias.  Skin: Negative for rash.  Neurological: Positive for light-headedness and headaches. Negative for dizziness.    Physical Exam Updated Vital Signs BP (!) 135/94   Pulse 68   Temp 99.1 F (37.3 C) (Oral) Comment: Simultaneous filing. User may not have seen previous data. Comment (Src): Simultaneous filing. User may not have seen previous data.  Resp 19   LMP 05/01/2019   SpO2 97%   Physical Exam Vitals and nursing note reviewed.  Constitutional:      General: She is not in acute distress.    Comments: Patient is well-appearing in no acute distress.  Pleasant,  39 year old woman able answer questions properly follow commands.  HENT:     Head: Normocephalic and atraumatic.     Nose: Nose normal.     Mouth/Throat:     Mouth: Mucous membranes are moist.  Eyes:     General: No scleral icterus. Neck:     Vascular: No carotid bruit.  Cardiovascular:     Rate and Rhythm: Normal rate and regular rhythm.  Pulses: Normal pulses.     Heart sounds: Normal heart sounds. No murmur. No gallop.      Comments: Heart rate 86 Pulmonary:     Effort: Pulmonary effort is normal. No respiratory distress.     Breath sounds: No wheezing.  Abdominal:     Palpations: Abdomen is soft.     Tenderness: There is no abdominal tenderness. There is no guarding or rebound.  Musculoskeletal:     Cervical back: Normal range of motion. No rigidity.     Right lower leg: No edema.     Left lower leg: No edema.  Skin:    General: Skin is warm and dry.     Capillary Refill: Capillary refill takes less than 2 seconds.  Neurological:     Mental Status: She is alert. Mental status is at baseline.     Comments: Alert and oriented to self, place, time and event.   Speech is fluent, clear without dysarthria or dysphasia.   Strength 5/5 in upper/lower extremities  Sensation intact in upper/lower extremities   Normal gait.  No hesitation.  She does not appear off balance walked around the room several times without difficulty. Negative Romberg. No pronator drift.  Normal finger-to-nose and feet tapping.  CN I not tested  CN II grossly intact visual fields bilaterally. Did not visualize posterior eye.   CN III, IV, VI PERRLA and EOMs intact bilaterally  CN V Intact sensation to sharp and light touch to the face  CN VII facial movements symmetric  CN VIII not tested  CN IX, X no uvula deviation, symmetric rise of soft palate  CN XI 5/5 SCM and trapezius strength bilaterally  CN XII Midline tongue protrusion, symmetric L/R movements   Psychiatric:        Mood and Affect:  Mood normal.        Behavior: Behavior normal.     ED Results / Procedures / Treatments   Labs (all labs ordered are listed, but only abnormal results are displayed) Labs Reviewed  COMPREHENSIVE METABOLIC PANEL - Abnormal; Notable for the following components:      Result Value   CO2 21 (*)    Calcium 8.7 (*)    All other components within normal limits  CBC WITH DIFFERENTIAL/PLATELET    EKG None  Radiology No results found.  Procedures Procedures (including critical care time)  Medications Ordered in ED Medications - No data to display  ED Course  I have reviewed the triage vital signs and the nursing notes.  Pertinent labs & imaging results that were available during my care of the patient were reviewed by me and considered in my medical decision making (see chart for details).  Clinical Course as of May 23 2134  Sat May 23, 2019  2132 EKG independently viewed by myself.  There is very mild sinus variation in heart rates with respiration.  Normal sinus rhythm otherwise.  Rate is within normal limits.  No axis deviation.  Normal R wave progression.  No ST-T wave changes.  ED EKG [WF]  2133 CMP without acute abnormality.  CBC without leukocytosis or anemia.   [WF]    Clinical Course User Index [WF] Gailen Shelter, [place]   MDM Rules/Calculators/A&P                      Patient presented today for lightheadedness that occurs when she stands.  This has been going on for the past week.  Doubt acute  abnormality or organic disease causing her symptoms today.  She has been evaluated at urgent care within the week exam which he states does not help her however child was given Zofran which she states helps with her nausea that is intermittent.  History of atrial only had one episode of nausea the last few hours some has since not recurred.  She has been asymptomatic during ED visit.  She is afebrile, normal vital signs apart from transient mild elevation of  She has no visual  abnormalities, neurologically intact, normal gait, no urinary issues.  Doubt normal pressure hydrocephalus, doubt central infection, doubt posterior cerebellar stroke, doubt idiopathic intercranial hypertension, doubt for the Mnire's or vestibular neuritis or labyrinthitis as this patient has no vertiginous symptoms.  Suspect that this is more of a presyncopal/lightheadedness that is probably physiologic.  She has no chest pain, shortness of breath and her initial tachycardia resolved without intervention.  She has a heart rate of 60-80 during my repeated evaluations.  I discussed this case with my attending physician who cosigned this note including patient's presenting symptoms, physical exam, and planned diagnostics and interventions. Attending physician stated agreement with plan or made changes to plan which were implemented.   Attending physician assessed patient at bedside.  We will discharge patient at this time.  She will follow up with her PMD for reevaluation.  She will return to ED for any new concerning symptoms.   The medical records were personally reviewed by myself. I personally reviewed all lab results and interpreted all imaging studies and either concurred with their official read or contacted radiology for clarification.   This patient appears reasonably screened and I doubt any other medical condition requiring further workup, evaluation, or treatment in the ED at this time prior to discharge.   Patient's vitals are WNL apart from vital sign abnormalities discussed above, patient is in NAD, and able to ambulate in the ED at their baseline and able to tolerate PO.  Pain has been managed or a plan has been made for home management and has no complaints prior to discharge. Patient is comfortable with above plan and for discharge at this time. All questions were answered prior to disposition. Results from the ER workup discussed with the patient face to face and all questions  answered to the best of my ability. The patient is safe for discharge with strict return precautions. Patient appears safe for discharge with appropriate follow-up. Conveyed my impression with the patient and they voiced understanding and are agreeable to plan.   An After Visit Summary was printed and given to the patient.  Portions of this note were generated with Scientist, clinical (histocompatibility and immunogenetics). Dictation errors may occur despite best attempts at proofreading.    We will discharge her with Zofran for nausea if she has a recurrence of this.  Final Clinical Impression(s) / ED Diagnoses Final diagnoses:  Light headed  Nonintractable headache, unspecified chronicity pattern, unspecified headache type    Rx / DC Orders ED Discharge Orders         Ordered    ondansetron (ZOFRAN ODT) 4 MG disintegrating tablet  Every 8 hours PRN     05/23/19 1900           Gailen Shelter, [place] 05/23/19 2136    Sabas Sous, MD 05/24/19 1253

## 2019-05-23 NOTE — Discharge Instructions (Addendum)
Please use Zofran as needed for nausea.  Please drink plenty fluids and continue to increase your exercise.

## 2019-05-23 NOTE — ED Triage Notes (Signed)
Pt states she has been feeling light headed for about 6 days accompanied with HA. Seen on the 25th for same at [facility]

## 2019-07-17 ENCOUNTER — Emergency Department (HOSPITAL_COMMUNITY)
Admission: EM | Admit: 2019-07-17 | Discharge: 2019-07-17 | Disposition: A | Discharge status: Home / Self Care | Payer: 59 | Attending: Emergency Medicine | Admitting: Emergency Medicine

## 2019-07-17 ENCOUNTER — Other Ambulatory Visit: Payer: Self-pay

## 2019-07-17 ENCOUNTER — Encounter (HOSPITAL_COMMUNITY): Payer: Self-pay

## 2019-07-17 DIAGNOSIS — R109 Unspecified abdominal pain: Secondary | ICD-10-CM | POA: Insufficient documentation

## 2019-07-17 DIAGNOSIS — I10 Essential (primary) hypertension: Secondary | ICD-10-CM | POA: Insufficient documentation

## 2019-07-17 DIAGNOSIS — Z7982 Long term (current) use of aspirin: Secondary | ICD-10-CM | POA: Diagnosis not present

## 2019-07-17 DIAGNOSIS — Z79899 Other long term (current) drug therapy: Secondary | ICD-10-CM | POA: Diagnosis not present

## 2019-07-17 DIAGNOSIS — T148XXA Other injury of unspecified body region, initial encounter: Secondary | ICD-10-CM

## 2019-07-17 MED ORDER — METAXALONE 800 MG PO TABS: 800.0000 mg | ORAL_TABLET | Freq: Three times a day (TID) | ORAL | 0 refills | Status: DC

## 2019-07-17 NOTE — ED Provider Notes (Signed)
[facility]-EMERGENCY DEPT Provider Note   CSN: [id number] Arrival date & time: 07/17/19  1734     History Chief Complaint  Patient presents with  . Back Pain    Mindy Wade is a 39 y.o. female.  39 year old female presents with 3 days of bilateral flank pain.  No urinary symptoms.  Pain characterizes crampy and sharp and worse with certain positions.  Denies any pleuritic chest pain.  No rashes.  Has used Tylenol with limited relief.  No radiation down her legs.        Past Medical History:  Diagnosis Date  . AMA (advanced maternal age) multigravida 35+   . Breast feeding status of mother   . GERD (gastroesophageal reflux disease)   . Headache   . Hx of blood clots   . Hx of varicella   . Hyperlipemia   . Hypertension    takes meds  . Pregnancy induced hypertension   . Pulmonary embolism [facility])     Patient Active Problem List   Diagnosis Date Noted  . Term pregnancy 04/25/2018  . Vaginal delivery 04/25/2018  . Adherent placenta 04/25/2018  . Missed abortion with fetal demise before 20 completed weeks of gestation 05/03/2017  . Precipitous delivery 02/19/2015  . Postpartum care following vaginal delivery 02/19/2015  . Active labor at term 02/18/2015  . Pre-diabetes 05/14/2014  . Elevated LDL cholesterol level 05/14/2014  . Pulmonary embolism (HCC) 10/17/2013  . Labor and delivery indication for care or intervention 10/09/2013  . Spontaneous abortion in second trimester 10/09/2013  . SVD (spontaneous vaginal delivery) 03/02/2013    Past Surgical History:  Procedure Laterality Date  . CHOLECYSTECTOMY       OB History    Gravida  9   Para  8   Term  7   Preterm  1   AB  1   Living  7     SAB  0   TAB  1   Ectopic      Multiple  0   Live Births  8           Family History  Problem Relation Age of Onset  . Asthma Mother   . Heart disease Mother   . Hypertension Mother   . Stroke Mother   . Cancer Father         prostate  . Alcohol abuse Neg Hx   . Arthritis Neg Hx   . Birth defects Neg Hx   . COPD Neg Hx   . Depression Neg Hx   . Diabetes Neg Hx   . Drug abuse Neg Hx   . Early death Neg Hx   . Hearing loss Neg Hx   . Hyperlipidemia Neg Hx   . Kidney disease Neg Hx   . Learning disabilities Neg Hx   . Mental illness Neg Hx   . Mental retardation Neg Hx   . Miscarriages / Stillbirths Neg Hx   . Vision loss Neg Hx   . Varicose Veins Neg Hx     Social History   Tobacco Use  . Smoking status: Never Smoker  . Smokeless tobacco: Never Used  Substance Use Topics  . Alcohol use: No  . Drug use: No    Home Medications Prior to Admission medications   Medication Sig Start Date End Date Taking? Authorizing Provider  aspirin EC 81 MG tablet Take 81 mg by mouth daily.    [provider]  losartan (COZAAR) 50 MG tablet  Take 50 mg by mouth 2 (two) times daily.  06/03/18   [provider]  meclizine (ANTIVERT) 25 MG tablet Take 1 tablet (25 mg total) by mouth 3 (three) times daily as needed for dizziness or nausea. 05/21/19   Wieters, Hallie C, PA-C  Multiple Vitamin (MULTIVITAMIN WITH MINERALS) TABS tablet Take 1 tablet by mouth daily.    [provider]  ondansetron (ZOFRAN ODT) 4 MG disintegrating tablet Take 1 tablet (4 mg total) by mouth every 8 (eight) hours as needed for nausea or vomiting. 05/23/19   Tedd Sias, PA  paragard intrauterine copper IUD IUD by Intrauterine route continuous. Since 01/2019.    [provider]  enoxaparin (LOVENOX) 150 MG/ML injection Inject 0.73 mLs (110 mg total) into the skin daily. Patient not taking: Reported on 01/14/2019 09/22/18 01/14/19  Antonietta Breach, PA-C  omeprazole (PRILOSEC) 20 MG capsule Take 1 capsule (20 mg total) by mouth daily. Patient not taking: Reported on 09/22/2018 07/05/18 01/14/19  Carlisle Cater, PA-C    Allergies    Penicillins  Review of Systems   Review of Systems  All other systems  reviewed and are negative.   Physical Exam Updated Vital Signs BP (!) 153/113   Pulse 85   Temp 98.3 F (36.8 C)   Resp 16   Ht 1.778 m (5\' 10" )   Wt 116.1 kg   LMP 06/27/2019   SpO2 100%   BMI 36.73 kg/m   Physical Exam Vitals and nursing note reviewed.  Constitutional:      General: She is not in acute distress.    Appearance: Normal appearance. She is well-developed. She is not toxic-appearing.  HENT:     Head: Normocephalic and atraumatic.  Eyes:     General: Lids are normal.     Conjunctiva/sclera: Conjunctivae normal.     Pupils: Pupils are equal, round, and reactive to light.  Neck:     Thyroid: No thyroid mass.     Trachea: No tracheal deviation.  Cardiovascular:     Rate and Rhythm: Normal rate and regular rhythm.     Heart sounds: Normal heart sounds. No murmur. No gallop.   Pulmonary:     Effort: Pulmonary effort is normal. No respiratory distress.     Breath sounds: Normal breath sounds. No stridor. No decreased breath sounds, wheezing, rhonchi or rales.  Abdominal:     General: Bowel sounds are normal. There is no distension.     Palpations: Abdomen is soft.     Tenderness: There is no abdominal tenderness. There is no rebound.  Musculoskeletal:        General: Normal range of motion.     Cervical back: Normal range of motion and neck supple.     Lumbar back: Tenderness present.       Back:  Skin:    General: Skin is warm and dry.     Findings: No abrasion or rash.  Neurological:     Mental Status: She is alert and oriented to person, place, and time.     GCS: GCS eye subscore is 4. GCS verbal subscore is 5. GCS motor subscore is 6.     Cranial Nerves: No cranial nerve deficit.     Sensory: No sensory deficit.  Psychiatric:        Speech: Speech normal.        Behavior: Behavior normal.     ED Results / Procedures / Treatments   Labs (all labs ordered are listed, but  only abnormal results are displayed) Labs Reviewed - No data to display   EKG None  Radiology No results found.  Procedures Procedures (including critical care time)  Medications Ordered in ED Medications - No data to display  ED Course  I have reviewed the triage vital signs and the nursing notes.  Pertinent labs & imaging results that were available during my care of the patient were reviewed by me and considered in my medical decision making (see chart for details).    MDM Rules/Calculators/A&P                     Patient with MSK back pain and will prescribe muscle relaxants. Final Clinical Impression(s) / ED Diagnoses Final diagnoses:  None    Rx / DC Orders ED Discharge Orders    None       Lorre Nick, MD 07/17/19 1946

## 2019-07-17 NOTE — ED Triage Notes (Signed)
Patient c/o mid back pain x 2 days. Patient denies any injuries. 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

## 2019-08-19 IMAGING — CT CT HEAD W/O CM
4 series · 16 of 47 positions shown, 18 images · non-contrast
Comparison: CT brain 07/17/2008

CLINICAL DATA: Headache

EXAM:
CT HEAD WITHOUT CONTRAST
TECHNIQUE: Contiguous axial images were obtained from the base of the skull
through the vertex without intravenous contrast.

[Series 4: head wo · axial · 0.44mm/px · z∈[-113,+7]mm · 7 of 32 slices shown, 9 images]
[im 4/32  brain]
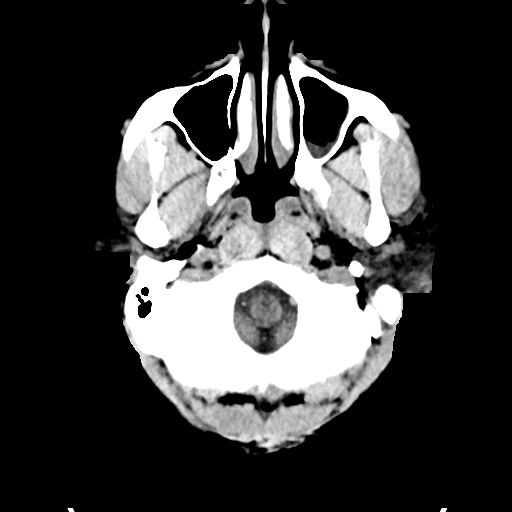
[im 4/32  bone]
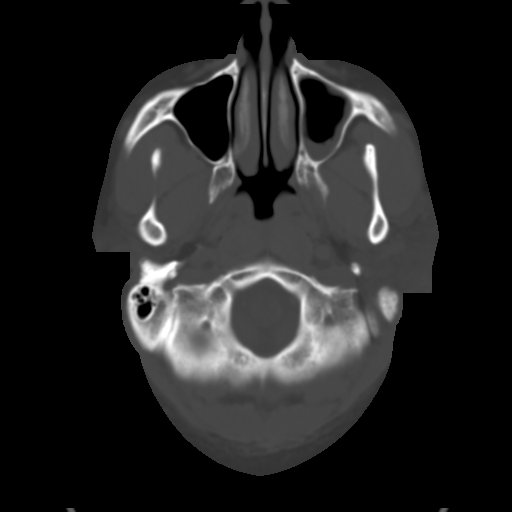
[im 8/32  brain]
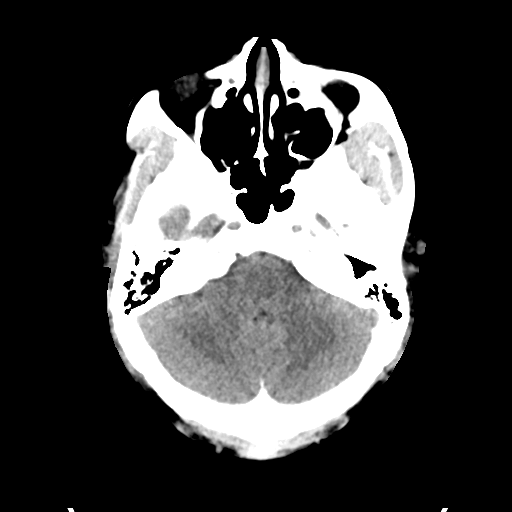
[im 12/32  brain]
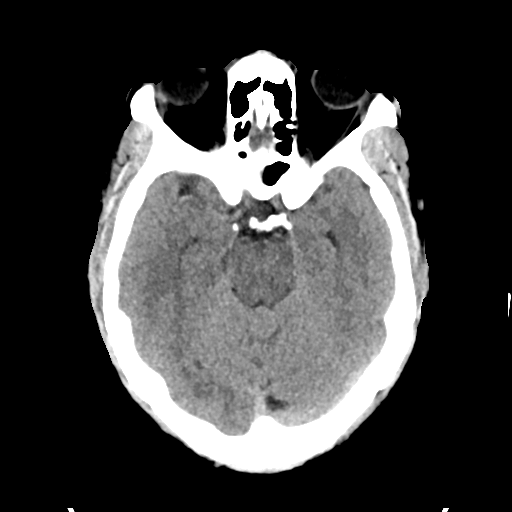
[im 16/32  brain]
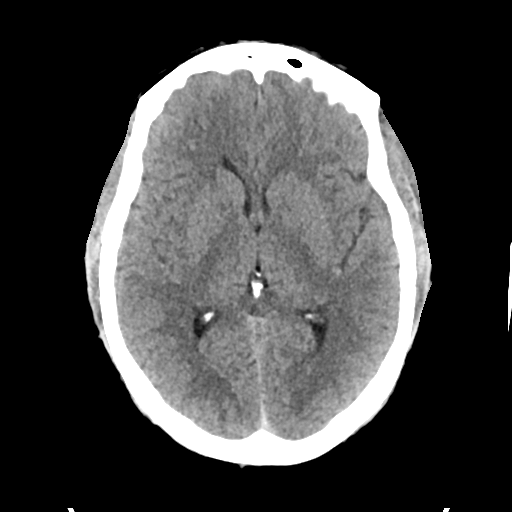
[im 20/32  brain]
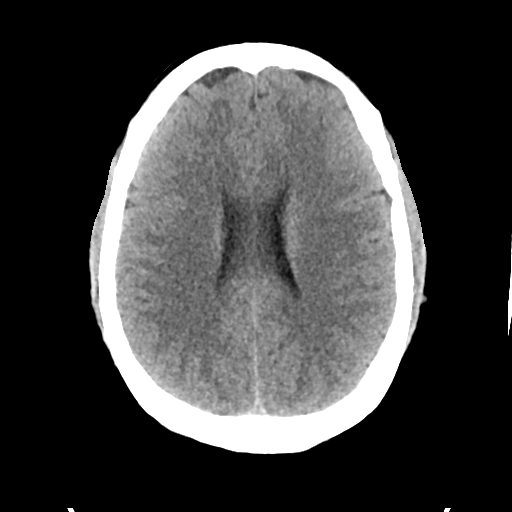
[im 20/32  bone]
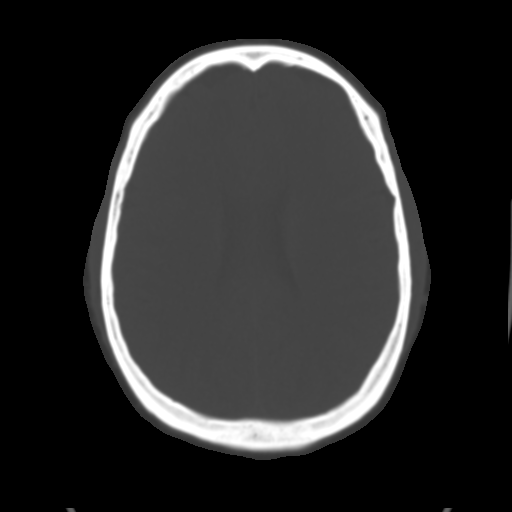
[im 24/32  brain]
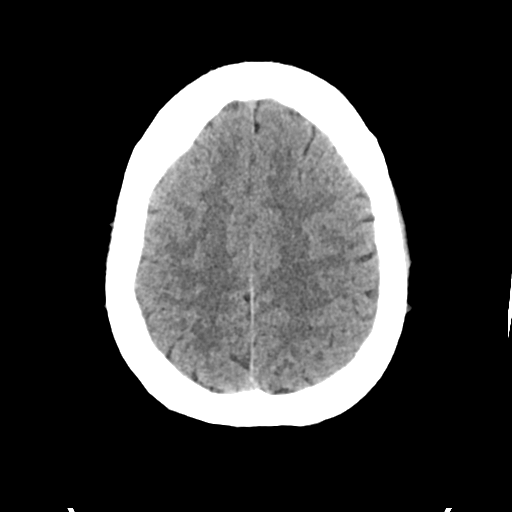
[im 28/32  brain]
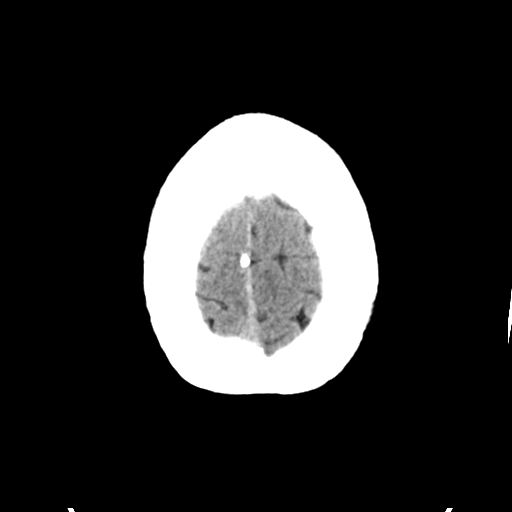

[Series 5: head bone · axial · 0.44mm/px · z∈[-114,-82]mm · 3 of 80 slices shown]
[im 8/80  bone]
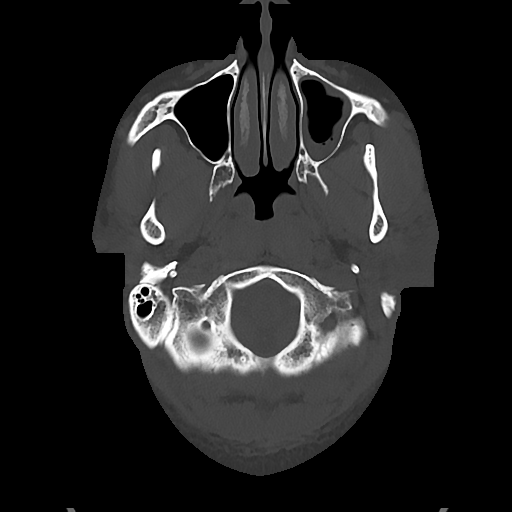
[im 16/80  bone]
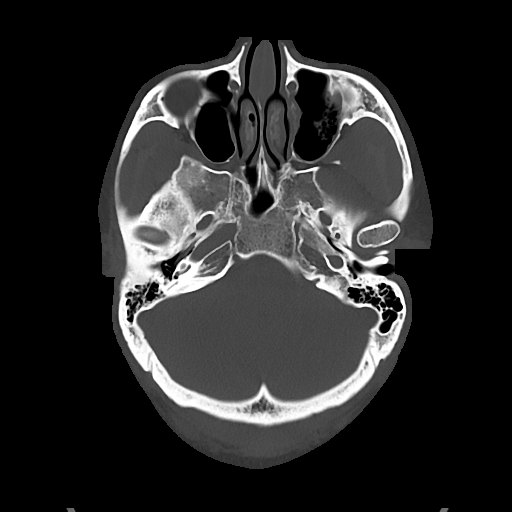
[im 24/80  bone]
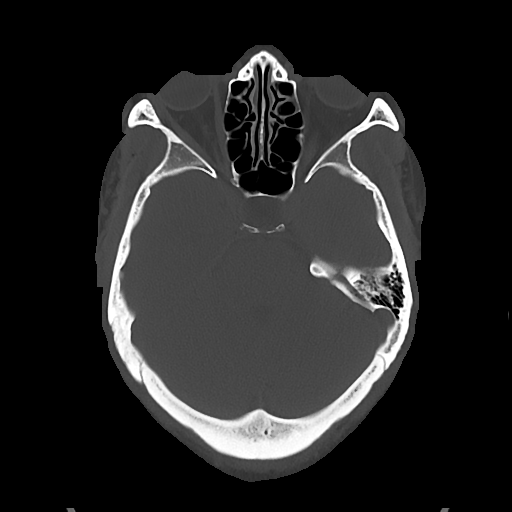

[Series 6: cor soft · coronal · 0.30mm/px · 3 of 67 slices shown]
[im 23/67  brain]
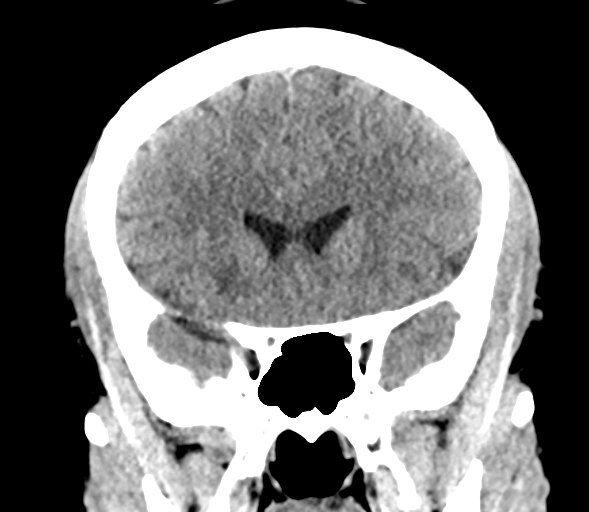
[im 30/67  brain]
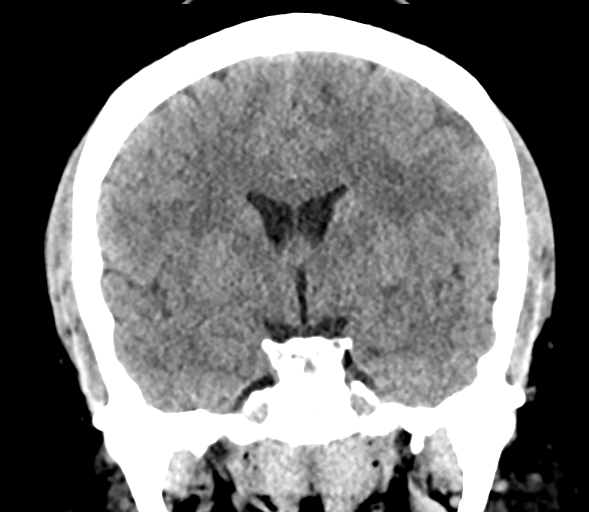
[im 37/67  brain]
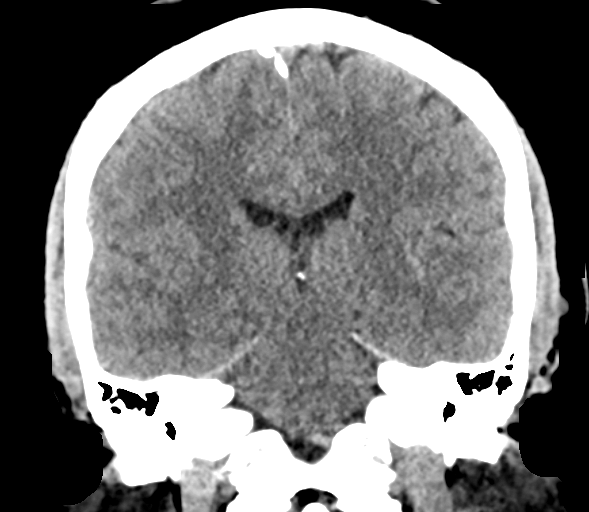

[Series 7: sag soft · sagittal · 0.30mm/px · 3 of 54 slices shown]
[im 18/54  brain]
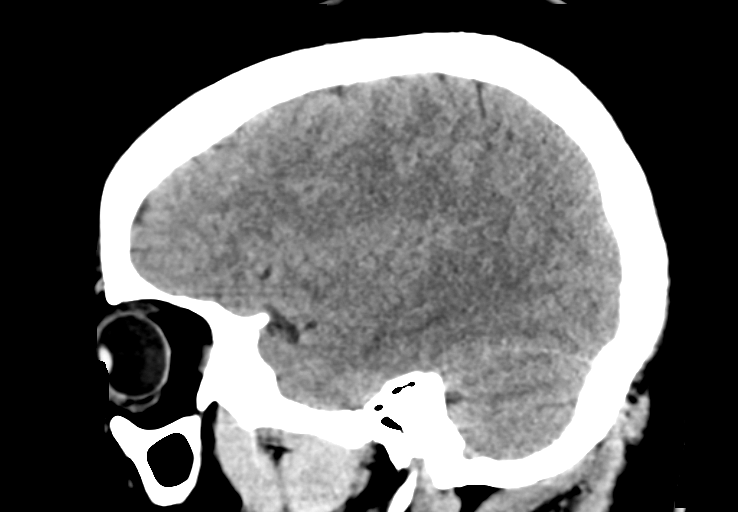
[im 27/54  brain]
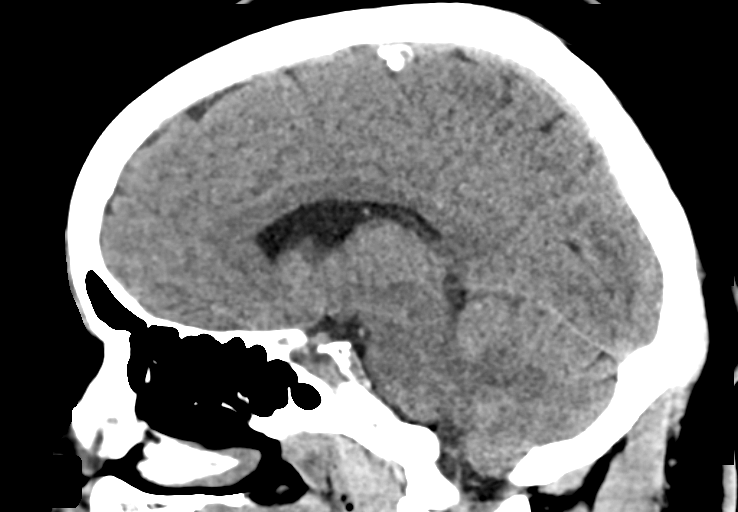
[im 36/54  brain]
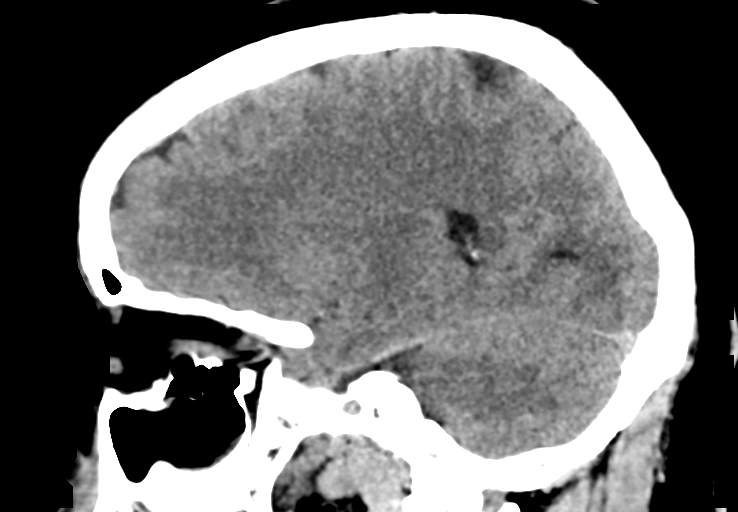

[16 of 47 positions shown; findings below may reference images not displayed]

FINDINGS: Brain: No evidence of acute infarction, hemorrhage, hydrocephalus,
extra-axial collection or mass lesion/mass effect.

Vascular: No hyperdense vessel or unexpected calcification.

Skull: Normal. Negative for fracture or focal lesion.

Sinuses/Orbits: Moderate mucosal thickening in the left maxillary
sinus with mild mucosal thickening in the ethmoid sinuses.

Other: None
IMPRESSION: 1. Negative non contrasted CT appearance of the brain
2. Sinus disease

## 2019-10-13 ENCOUNTER — Ambulatory Visit: Payer: Medicaid Other | Attending: Internal Medicine

## 2019-10-13 DIAGNOSIS — Z23 Encounter for immunization: Secondary | ICD-10-CM

## 2019-10-13 NOTE — Progress Notes (Signed)
   Covid-19 Vaccination Clinic  Name:  Mindy Wade    MRN: [id number] DOB: 1980-11-21  10/13/2019  [name] was observed post Covid-19 immunization for 15 minutes without incident. She was provided with Vaccine Information Sheet and instruction to access the V-Safe system.   [name] was instructed to call 911 with any severe reactions post vaccine: Marland Kitchen Difficulty breathing  . Swelling of face and throat  . A fast heartbeat  . A bad rash all over body  . Dizziness and weakness   Immunizations Administered    Name Date Dose VIS Date Route   Pfizer COVID-19 Vaccine 10/13/2019 12:21 PM 0.3 mL 04/22/2018 Intramuscular   Manufacturer: [employer], [employer]   Lot: [id number]   NDC: [address]-4

## 2019-11-03 ENCOUNTER — Ambulatory Visit: Payer: 59

## 2019-11-10 ENCOUNTER — Ambulatory Visit: Payer: 59 | Attending: Internal Medicine

## 2019-11-10 DIAGNOSIS — Z23 Encounter for immunization: Secondary | ICD-10-CM

## 2019-11-10 NOTE — Progress Notes (Signed)
   Covid-19 Vaccination Clinic  Name:  Mindy Wade    MRN: [id number] DOB: 1980/11/25  11/10/2019  [name] was observed post Covid-19 immunization for 15 minutes without incident. She was provided with Vaccine Information Sheet and instruction to access the V-Safe system.   Ms. How was instructed to call 911 with any severe reactions post vaccine: Marland Kitchen Difficulty breathing  . Swelling of face and throat  . A fast heartbeat  . A bad rash all over body  . Dizziness and weakness   Immunizations Administered    Name Date Dose VIS Date Route   Pfizer COVID-19 Vaccine 11/10/2019 10:06 AM 0.3 mL 04/22/2018 Intramuscular   Manufacturer: [employer], [employer]   Lot: [id number]   NDC: [id number]

## 2019-11-14 ENCOUNTER — Other Ambulatory Visit: Payer: Self-pay

## 2019-11-14 ENCOUNTER — Emergency Department (HOSPITAL_COMMUNITY): Payer: 59

## 2019-11-14 ENCOUNTER — Encounter (HOSPITAL_COMMUNITY): Payer: Self-pay | Admitting: Emergency Medicine

## 2019-11-14 ENCOUNTER — Emergency Department (HOSPITAL_COMMUNITY)
Admission: EM | Admit: 2019-11-14 | Discharge: 2019-11-15 | Disposition: A | Discharge status: Home / Self Care | Payer: 59 | Attending: Emergency Medicine | Admitting: Emergency Medicine

## 2019-11-14 DIAGNOSIS — I10 Essential (primary) hypertension: Secondary | ICD-10-CM | POA: Insufficient documentation

## 2019-11-14 DIAGNOSIS — Z79899 Other long term (current) drug therapy: Secondary | ICD-10-CM | POA: Insufficient documentation

## 2019-11-14 DIAGNOSIS — R0789 Other chest pain: Secondary | ICD-10-CM | POA: Insufficient documentation

## 2019-11-14 DIAGNOSIS — Z7982 Long term (current) use of aspirin: Secondary | ICD-10-CM | POA: Insufficient documentation

## 2019-11-14 DIAGNOSIS — R079 Chest pain, unspecified: Secondary | ICD-10-CM | POA: Diagnosis present

## 2019-11-14 LAB — CBC
HCT: 39.8 % (ref 36.0–46.0)
Hemoglobin: 12.7 g/dL (ref 12.0–15.0)
MCH: 29.2 pg (ref 26.0–34.0)
MCHC: 31.9 g/dL (ref 30.0–36.0)
MCV: 91.5 fL (ref 80.0–100.0)
Platelets: 288 10*3/uL (ref 150–400)
RBC: 4.35 MIL/uL (ref 3.87–5.11)
RDW: 13.5 % (ref 11.5–15.5)
WBC: 6.8 10*3/uL (ref 4.0–10.5)
nRBC: 0 % (ref 0.0–0.2)

## 2019-11-14 LAB — BASIC METABOLIC PANEL
Anion gap: 11 (ref 5–15)
BUN: 8 mg/dL (ref 6–20)
CO2: 22 mmol/L (ref 22–32)
Calcium: 9.1 mg/dL (ref 8.9–10.3)
Chloride: 105 mmol/L (ref 98–111)
Creatinine, Ser: 0.67 mg/dL (ref 0.44–1.00)
GFR calc Af Amer: >60 mL/min (ref >60)
GFR calc non Af Amer: >60 mL/min (ref >60)
Glucose, Bld: 95 mg/dL (ref 70–99)
Potassium: 3.6 mmol/L (ref 3.5–5.1)
Sodium: 138 mmol/L (ref 135–145)

## 2019-11-14 LAB — TROPONIN I (HIGH SENSITIVITY): Troponin I (High Sensitivity): 5 ng/L (ref <18)

## 2019-11-14 LAB — I-STAT BETA HCG BLOOD, ED (MC, WL, AP ONLY): I-stat hCG, quantitative: <5 m[IU]/mL (ref <5)

## 2019-11-14 MED ORDER — FAMOTIDINE 20 MG PO TABS
20.0000 mg | ORAL_TABLET | Freq: Once | ORAL | Status: AC
  Administered 2019-11-14: 20 mg via ORAL
  Filled 2019-11-14: qty 1

## 2019-11-14 MED ORDER — PANTOPRAZOLE SODIUM 20 MG PO TBEC: 20.0000 mg | DELAYED_RELEASE_TABLET | Freq: Every day | ORAL | 1 refills | Status: DC

## 2019-11-14 MED ORDER — FAMOTIDINE 20 MG PO TABS: 20.0000 mg | ORAL_TABLET | Freq: Two times a day (BID) | ORAL | 0 refills | Status: DC

## 2019-11-14 MED ORDER — PANTOPRAZOLE SODIUM 20 MG PO TBEC
20.0000 mg | DELAYED_RELEASE_TABLET | Freq: Once | ORAL | Status: AC
  Administered 2019-11-14: 20 mg via ORAL
  Filled 2019-11-14: qty 1

## 2019-11-14 NOTE — Discharge Instructions (Signed)
Acid Reflux Treatment  Diet: Start with a clear liquid diet, progressed to a full liquid diet, and then bland solids as you are able. Please adhere to the enclosed dietary suggestions.  In general, avoid NSAIDs (i.e. ibuprofen, naproxen, etc.), caffeine, alcohol, spicy foods, fatty foods, or any other foods that seem to cause your symptoms to arise.  Protonix: Take this medication daily, 20-30 minutes prior to your first meal, for the next 8 weeks.  Continue to take this medication even if you begin to feel better.  Pepcid: Take this medication twice a day for the next 5 days.  Follow-up: Please follow-up with your primary care provider on this matter.  Return: Return to the ED for significantly worsening symptoms, persistent vomiting, persistent fever, vomiting blood, blood in the stools, dark stools, or any other major concerns.  For prescription assistance, may try using prescription discount sites or apps, such as goodrx.com

## 2019-11-14 NOTE — ED Provider Notes (Signed)
[facility]   CSN: [id number] Arrival date & time: 11/14/19  2019     History Chief Complaint  Patient presents with  . Chest Pain    Mindy Wade is a 39 y.o. female.  HPI  HPI: A 39 year old patient with a history of hypertension, hypercholesterolemia and obesity presents for evaluation of chest pain. Initial onset of pain was approximately 3-6 hours ago. The patient's chest pain is not worse with exertion. The patient's chest pain is middle- or left-sided, is not well-localized, is not described as heaviness/pressure/tightness, is not sharp and does not radiate to the arms/jaw/neck. The patient does not complain of nausea and denies diaphoresis. The patient has a family history of coronary artery disease in a first-degree relative with onset less than age 40. The patient has no history of stroke, has no history of peripheral artery disease, has not smoked in the past 90 days and denies any history of treated diabetes.    Mindy Wade is a 39 y.o. female, with a history of hyperlipidemia, HTN, PE, presenting to the ED with chest discomfort that began around 5 PM today. Denies any stressful or strenuous activity at that time. Pain was central chest leading to the throat, burning, moderate, no other radiation, intermittent, lasting for 15 minutes or less at the time. No improvement with Tums. She has had similar pain in the past with acid reflux, but became worried because she has not had acid reflux "in a while." She states her discomfort is nothing like what she experienced when she had her PE. Denies fever/chills, cough, shortness of breath, abdominal pain, back pain, diaphoresis, dizziness, N/V/D, lower extremity edema/pain, or any other complaints.  Past Medical History:  Diagnosis Date  . AMA (advanced maternal age) multigravida 35+   . Breast feeding status of mother   . GERD (gastroesophageal reflux disease)    . Headache   . Hx of blood clots   . Hx of varicella   . Hyperlipemia   . Hypertension    takes meds  . Pregnancy induced hypertension   . Pulmonary embolism [facility](HCC)     Patient Active Problem List   Diagnosis Date Noted  . Term pregnancy 04/25/2018  . Vaginal delivery 04/25/2018  . Adherent placenta 04/25/2018  . Missed abortion with fetal demise before 20 completed weeks of gestation 05/03/2017  . Precipitous delivery 02/19/2015  . Postpartum care following vaginal delivery 02/19/2015  . Active labor at term 02/18/2015  . Pre-diabetes 05/14/2014  . Elevated LDL cholesterol level 05/14/2014  . Pulmonary embolism (HCC) 10/17/2013  . Labor and delivery indication for care or intervention 10/09/2013  . Spontaneous abortion in second trimester 10/09/2013  . SVD (spontaneous vaginal delivery) 03/02/2013    Past Surgical History:  Procedure Laterality Date  . CHOLECYSTECTOMY       OB History    Gravida  9   Para  8   Term  7   Preterm  1   AB  1   Living  7     SAB  0   TAB  1   Ectopic      Multiple  0   Live Births  8           Family History  Problem Relation Age of Onset  . Asthma Mother   . Heart disease Mother   . Hypertension Mother   . Stroke Mother   . Heart attack Mother [id number]  .  Cancer Father        prostate  . Alcohol abuse Neg Hx   . Arthritis Neg Hx   . Birth defects Neg Hx   . COPD Neg Hx   . Depression Neg Hx   . Diabetes Neg Hx   . Drug abuse Neg Hx   . Early death Neg Hx   . Hearing loss Neg Hx   . Hyperlipidemia Neg Hx   . Kidney disease Neg Hx   . Learning disabilities Neg Hx   . Mental illness Neg Hx   . Mental retardation Neg Hx   . Miscarriages / Stillbirths Neg Hx   . Vision loss Neg Hx   . Varicose Veins Neg Hx     Social History   Tobacco Use  . Smoking status: Never Smoker  . Smokeless tobacco: Never Used  Vaping Use  . Vaping Use: Never used  Substance Use Topics  . Alcohol use: No  . Drug use: No     Home Medications Prior to Admission medications   Medication Sig Start Date End Date Taking? Authorizing Provider  aspirin EC 81 MG tablet Take 81 mg by mouth daily.    [provider]  famotidine (PEPCID) 20 MG tablet Take 1 tablet (20 mg total) by mouth 2 (two) times daily for 5 days. 11/14/19 11/19/19  Curtis Uriarte C, PA-C  losartan (COZAAR) 50 MG tablet Take 50 mg by mouth 2 (two) times daily.  06/03/18   [provider]  meclizine (ANTIVERT) 25 MG tablet Take 1 tablet (25 mg total) by mouth 3 (three) times daily as needed for dizziness or nausea. 05/21/19   Wieters, Hallie C, PA-C  metaxalone (SKELAXIN) 800 MG tablet Take 1 tablet (800 mg total) by mouth 3 (three) times daily. 07/17/19   Lorre Nick, MD  Multiple Vitamin (MULTIVITAMIN WITH MINERALS) TABS tablet Take 1 tablet by mouth daily.    [provider]  ondansetron (ZOFRAN ODT) 4 MG disintegrating tablet Take 1 tablet (4 mg total) by mouth every 8 (eight) hours as needed for nausea or vomiting. 05/23/19   Gailen Shelter, PA  pantoprazole (PROTONIX) 20 MG tablet Take 1 tablet (20 mg total) by mouth daily. 11/14/19 01/13/20  Eveleen Mcnear, Hillard Danker, PA-C  paragard intrauterine copper IUD IUD by Intrauterine route continuous. Since 01/2019.    [provider]  enoxaparin (LOVENOX) 150 MG/ML injection Inject 0.73 mLs (110 mg total) into the skin daily. Patient not taking: Reported on 01/14/2019 09/22/18 01/14/19  Antony Madura, PA-C  omeprazole (PRILOSEC) 20 MG capsule Take 1 capsule (20 mg total) by mouth daily. Patient not taking: Reported on 09/22/2018 07/05/18 01/14/19  Renne Crigler, PA-C    Allergies    Penicillins  Review of Systems   Review of Systems  Constitutional: Negative for chills, diaphoresis and fever.  Respiratory: Negative for cough and shortness of breath.   Cardiovascular: Positive for chest pain. Negative for leg swelling.  Gastrointestinal: Negative for abdominal pain, diarrhea, nausea  and vomiting.  Musculoskeletal: Negative for back pain.  Neurological: Negative for dizziness, syncope and weakness.  All other systems reviewed and are negative.   Physical Exam Updated Vital Signs BP (!) 153/105 (BP Location: Right Arm)   Pulse 85   Temp 98.8 F (37.1 C) (Oral)   Resp 18   Ht 5\' 10"  (1.778 m)   Wt 115.7 kg   LMP 11/11/2019 (Exact Date)   SpO2 100%   BMI 36.59 kg/m   Physical Exam  Vitals and nursing note reviewed.  Constitutional:      General: She is not in acute distress.    Appearance: She is well-developed. She is obese. She is not diaphoretic.  HENT:     Head: Normocephalic and atraumatic.     Mouth/Throat:     Mouth: Mucous membranes are moist.     Pharynx: Oropharynx is clear.  Eyes:     Conjunctiva/sclera: Conjunctivae normal.  Cardiovascular:     Rate and Rhythm: Normal rate and regular rhythm.     Pulses: Normal pulses.          Radial pulses are 2+ on the right side and 2+ on the left side.       Posterior tibial pulses are 2+ on the right side and 2+ on the left side.     Heart sounds: Normal heart sounds.     Comments: Tactile temperature in the extremities appropriate and equal bilaterally. Pulmonary:     Effort: Pulmonary effort is normal. No respiratory distress.     Breath sounds: Normal breath sounds.  Abdominal:     Palpations: Abdomen is soft.     Tenderness: There is no abdominal tenderness. There is no guarding.  Musculoskeletal:     Cervical back: Neck supple.     Right lower leg: No edema.     Left lower leg: No edema.  Lymphadenopathy:     Cervical: No cervical adenopathy.  Skin:    General: Skin is warm and dry.  Neurological:     Mental Status: She is alert.  Psychiatric:        Mood and Affect: Mood and affect normal.        Speech: Speech normal.        Behavior: Behavior normal.     ED Results / Procedures / Treatments   Labs (all labs ordered are listed, but only abnormal results are displayed) Labs  Reviewed  BASIC METABOLIC PANEL  CBC  I-STAT BETA HCG BLOOD, ED ([facility], [facility], [facility] ONLY)  TROPONIN I (HIGH SENSITIVITY)  TROPONIN I (HIGH SENSITIVITY)    EKG EKG Interpretation  Date/Time:  Saturday November 14 2019 20:43:30 EDT Ventricular Rate:  98 PR Interval:  152 QRS Duration: 84 QT Interval:  338 QTC Calculation: 431 R Axis:   27 Text Interpretation: Normal sinus rhythm Normal ECG No significant change since last tracing Confirmed by Richardean Canal [id number]) on 11/14/2019 10:43:42 PM   Radiology DG Chest 2 View  Result Date: 11/14/2019 CLINICAL DATA:  Chest pain for 3 hours, hypertension EXAM: CHEST - 2 VIEW COMPARISON:  03/10/2019 FINDINGS: Frontal and lateral views of the chest demonstrate an unremarkable cardiac silhouette. No airspace disease, effusion, or pneumothorax. No acute bony abnormalities. IMPRESSION: 1. No acute intrathoracic process. Electronically Signed   By: Sharlet Salina M.D.   On: 11/14/2019 21:33    Procedures Procedures (including critical care time)  Medications Ordered in ED Medications  pantoprazole (PROTONIX) EC tablet 20 mg (20 mg Oral Given 11/14/19 2252)  famotidine (PEPCID) tablet 20 mg (20 mg Oral Given 11/14/19 2252)    ED Course  I have reviewed the triage vital signs and the nursing notes.  Pertinent labs & imaging results that were available during my care of the patient were reviewed by me and considered in my medical decision making (see chart for details).    MDM Rules/Calculators/A&P HEAR Score: 2  Patient presents with chest discomfort that is burning in nature, similar to previous acid reflux. Patient is nontoxic appearing, afebrile, not tachycardic, not tachypneic, not hypotensive, maintains excellent SPO2 on room air, and is in no apparent distress.   I have reviewed the patient's chart to obtain more information.   I reviewed and interpreted the patient's labs and radiological studies. Low suspicion for  ACS. HEART score is 2, indicating low risk for a cardiac event.  EKG without evidence of acute ischemia or pathologic/symptomatic arrhythmia.  Delta troponins negative.  Wells criteria score is 1.5, indicating low risk for PE.   Dissection was considered, but thought less likely base on: History and description of the pain are not suggestive, patient is not ill-appearing, lack of risk factors, equal bilateral pulses, lack of neurologic deficits, no widened mediastinum on chest x-ray.  The patient was given instructions for home care as well as return precautions. Patient voices understanding of these instructions, accepts the plan, and is comfortable with discharge.     Vitals:   11/14/19 2047 11/14/19 2053 11/14/19 2243  BP: (!) 153/105  (!) 135/100  Pulse: 85  66  Resp: 18  (!) 21  Temp: 98.8 F (37.1 C)    TempSrc: Oral    SpO2: 100%  99%  Weight: 115.7 kg 115.7 kg   Height: 5\' 10"  (1.778 m) 5\' 10"  (1.778 m)    We discussed the patient's hypertension.  My suspicion for hypertensive emergency is low.  She will follow-up with her PCP on this matter.  Final Clinical Impression(s) / ED Diagnoses Final diagnoses:  Burning in the chest    Rx / DC Orders ED Discharge Orders         Ordered    pantoprazole (PROTONIX) 20 MG tablet  Daily        11/14/19 2238    famotidine (PEPCID) 20 MG tablet  2 times daily        11/14/19 2238           11/16/19, PA-C 11/15/19 0032    Anselm Pancoast, MD 11/15/19 1521

## 2019-11-14 NOTE — ED Triage Notes (Signed)
Pt c/o central chest pain onset earlier tonight.  Pt denies any nausea or vomiting.  No shortness of breath

## 2019-11-15 ENCOUNTER — Other Ambulatory Visit: Payer: Self-pay

## 2019-11-15 LAB — TROPONIN I (HIGH SENSITIVITY): Troponin I (High Sensitivity): 5 ng/L (ref <18)

## 2020-01-29 ENCOUNTER — Ambulatory Visit: Payer: Self-pay

## 2020-02-17 ENCOUNTER — Emergency Department (HOSPITAL_COMMUNITY): Payer: 59

## 2020-02-17 ENCOUNTER — Other Ambulatory Visit: Payer: Self-pay

## 2020-02-17 ENCOUNTER — Encounter (HOSPITAL_COMMUNITY): Payer: Self-pay

## 2020-02-17 ENCOUNTER — Emergency Department (HOSPITAL_COMMUNITY)
Admission: EM | Admit: 2020-02-17 | Discharge: 2020-02-17 | Disposition: A | Discharge status: Home / Self Care | Payer: 59 | Attending: Emergency Medicine | Admitting: Emergency Medicine

## 2020-02-17 DIAGNOSIS — R0789 Other chest pain: Secondary | ICD-10-CM

## 2020-02-17 DIAGNOSIS — Z79899 Other long term (current) drug therapy: Secondary | ICD-10-CM | POA: Insufficient documentation

## 2020-02-17 DIAGNOSIS — I10 Essential (primary) hypertension: Secondary | ICD-10-CM | POA: Diagnosis not present

## 2020-02-17 DIAGNOSIS — R0602 Shortness of breath: Secondary | ICD-10-CM | POA: Insufficient documentation

## 2020-02-17 DIAGNOSIS — Z86711 Personal history of pulmonary embolism: Secondary | ICD-10-CM | POA: Insufficient documentation

## 2020-02-17 DIAGNOSIS — R079 Chest pain, unspecified: Secondary | ICD-10-CM | POA: Insufficient documentation

## 2020-02-17 DIAGNOSIS — Z7982 Long term (current) use of aspirin: Secondary | ICD-10-CM | POA: Diagnosis not present

## 2020-02-17 LAB — I-STAT BETA HCG BLOOD, ED (MC, WL, AP ONLY): I-stat hCG, quantitative: <5 m[IU]/mL (ref <5)

## 2020-02-17 LAB — BASIC METABOLIC PANEL
Anion gap: 9 (ref 5–15)
BUN: 11 mg/dL (ref 6–20)
CO2: 23 mmol/L (ref 22–32)
Calcium: 8.8 mg/dL — ABNORMAL LOW (ref 8.9–10.3)
Chloride: 105 mmol/L (ref 98–111)
Creatinine, Ser: 0.62 mg/dL (ref 0.44–1.00)
GFR, Estimated: >60 mL/min (ref >60)
Glucose, Bld: 94 mg/dL (ref 70–99)
Potassium: 3.6 mmol/L (ref 3.5–5.1)
Sodium: 137 mmol/L (ref 135–145)

## 2020-02-17 LAB — CBC
HCT: 38.7 % (ref 36.0–46.0)
Hemoglobin: 12.3 g/dL (ref 12.0–15.0)
MCH: 29.1 pg (ref 26.0–34.0)
MCHC: 31.8 g/dL (ref 30.0–36.0)
MCV: 91.7 fL (ref 80.0–100.0)
Platelets: 281 10*3/uL (ref 150–400)
RBC: 4.22 MIL/uL (ref 3.87–5.11)
RDW: 14.1 % (ref 11.5–15.5)
WBC: 7.2 10*3/uL (ref 4.0–10.5)
nRBC: 0 % (ref 0.0–0.2)

## 2020-02-17 LAB — BRAIN NATRIURETIC PEPTIDE: B Natriuretic Peptide: 17.8 pg/mL (ref 0.0–100.0)

## 2020-02-17 LAB — TROPONIN I (HIGH SENSITIVITY)
Troponin I (High Sensitivity): 8 ng/L (ref <18)
Troponin I (High Sensitivity): 7 ng/L (ref <18)
Troponin I (High Sensitivity): 5 ng/L (ref <18)

## 2020-02-17 LAB — D-DIMER, QUANTITATIVE: D-Dimer, Quant: 0.42 ug/mL-FEU (ref 0.00–0.50)

## 2020-02-17 MED ORDER — ALBUTEROL SULFATE HFA 108 (90 BASE) MCG/ACT IN AERS: 2.0000 | INHALATION_SPRAY | RESPIRATORY_TRACT | Status: DC | PRN

## 2020-02-17 NOTE — ED Provider Notes (Addendum)
[facility] EMERGENCY DEPARTMENT Provider Note   CSN: [id number] Arrival date & time: 02/17/20  0020     History Chief Complaint  Patient presents with  . Chest Pain  . Shortness of Breath    Mindy Wade is a 39 y.o. female.  HPI Patient is a 39 year old female with an extensive medical history as noted below.  Patient works at the [employer] and yesterday evening at work was having some mild central chest tightness as well as shortness of breath.  She then went up a flight of stairs began experiencing worsening shortness of breath.  She notes a history of PE in 2015 and states that it felt somewhat similar to this occurrence but milder, so she came to the emergency department for evaluation.  She states that her symptoms were worsening with exertion but seem to resolve with rest.  Has no complaints at this time.  Denies any hemoptysis, leg swelling, recent travel or immobilization, oral estrogen use.  She took Eliquis for 6 months for her prior PE but is no longer anticoagulated.  She is followed by cardiology.  Denies abdominal pain, nausea, vomiting.     Past Medical History:  Diagnosis Date  . AMA (advanced maternal age) multigravida 35+   . Breast feeding status of mother   . GERD (gastroesophageal reflux disease)   . Headache   . Hx of blood clots   . Hx of varicella   . Hyperlipemia   . Hypertension    takes meds  . Pregnancy induced hypertension   . Pulmonary embolism [facility](H[facility])     Patient Active Problem List   Diagnosis Date Noted  . Term pregnancy 04/25/2018  . Vaginal delivery 04/25/2018  . Adherent placenta 04/25/2018  . Missed abortion with fetal demise before 20 completed weeks of gestation 05/03/2017  . Precipitous delivery 02/19/2015  . Postpartum care following vaginal delivery 02/19/2015  . Active labor at term 02/18/2015  . Pre-diabetes 05/14/2014  . Elevated LDL cholesterol level 05/14/2014  . Pulmonary embolism (HCC)  10/17/2013  . Labor and delivery indication for care or intervention 10/09/2013  . Spontaneous abortion in second trimester 10/09/2013  . SVD (spontaneous vaginal delivery) 03/02/2013    Past Surgical History:  Procedure Laterality Date  . CHOLECYSTECTOMY       OB History    Gravida  9   Para  8   Term  7   Preterm  1   AB  1   Living  7     SAB  0   IAB  1   Ectopic      Multiple  0   Live Births  8           Family History  Problem Relation Age of Onset  . Asthma Mother   . Heart disease Mother   . Hypertension Mother   . Stroke Mother   . Heart attack Mother 2136  . Cancer Father        prostate  . Alcohol abuse Neg Hx   . Arthritis Neg Hx   . Birth defects Neg Hx   . COPD Neg Hx   . Depression Neg Hx   . Diabetes Neg Hx   . Drug abuse Neg Hx   . Early death Neg Hx   . Hearing loss Neg Hx   . Hyperlipidemia Neg Hx   . Kidney disease Neg Hx   . Learning disabilities Neg Hx   . Mental illness  Neg Hx   . Mental retardation Neg Hx   . Miscarriages / Stillbirths Neg Hx   . Vision loss Neg Hx   . Varicose Veins Neg Hx     Social History   Tobacco Use  . Smoking status: Never Smoker  . Smokeless tobacco: Never Used  Vaping Use  . Vaping Use: Never used  Substance Use Topics  . Alcohol use: No  . Drug use: No    Home Medications Prior to Admission medications   Medication Sig Start Date End Date Taking? Authorizing Provider  aspirin EC 81 MG tablet Take 81 mg by mouth daily.    [provider]  famotidine (PEPCID) 20 MG tablet Take 1 tablet (20 mg total) by mouth 2 (two) times daily for 5 days. 11/14/19 11/19/19  Joy, Shawn C, PA-C  losartan (COZAAR) 50 MG tablet Take 50 mg by mouth 2 (two) times daily.  06/03/18   [provider]  meclizine (ANTIVERT) 25 MG tablet Take 1 tablet (25 mg total) by mouth 3 (three) times daily as needed for dizziness or nausea. 05/21/19   Wieters, Hallie C, PA-C  metaxalone (SKELAXIN) 800 MG  tablet Take 1 tablet (800 mg total) by mouth 3 (three) times daily. 07/17/19   Lorre Nick, MD  Multiple Vitamin (MULTIVITAMIN WITH MINERALS) TABS tablet Take 1 tablet by mouth daily.    [provider]  ondansetron (ZOFRAN ODT) 4 MG disintegrating tablet Take 1 tablet (4 mg total) by mouth every 8 (eight) hours as needed for nausea or vomiting. 05/23/19   Gailen Shelter, PA  pantoprazole (PROTONIX) 20 MG tablet Take 1 tablet (20 mg total) by mouth daily. 11/14/19 01/13/20  Joy, Hillard Danker, PA-C  paragard intrauterine copper IUD IUD by Intrauterine route continuous. Since 01/2019.    [provider]  enoxaparin (LOVENOX) 150 MG/ML injection Inject 0.73 mLs (110 mg total) into the skin daily. Patient not taking: Reported on 01/14/2019 09/22/18 01/14/19  Antony Madura, PA-C  omeprazole (PRILOSEC) 20 MG capsule Take 1 capsule (20 mg total) by mouth daily. Patient not taking: Reported on 09/22/2018 07/05/18 01/14/19  Renne Crigler, PA-C    Allergies    Penicillins  Review of Systems   Review of Systems  All other systems reviewed and are negative. Ten systems reviewed and are negative for acute change, except as noted in the HPI.   Physical Exam Updated Vital Signs BP 123/82 (BP Location: Right Arm)   Pulse 60   Temp 98.6 F (37 C) (Oral)   Resp 16   Ht 5\' 10"  (1.778 m)   Wt 117.9 kg   SpO2 100%   BMI 37.31 kg/m   Physical Exam Vitals and nursing note reviewed.  Constitutional:      General: She is not in acute distress.    Appearance: Normal appearance. She is well-developed. She is not ill-appearing, toxic-appearing or diaphoretic.  HENT:     Head: Normocephalic and atraumatic.     Right Ear: External ear normal.     Left Ear: External ear normal.     Nose: Nose normal.     Mouth/Throat:     Mouth: Mucous membranes are moist.     Pharynx: Oropharynx is clear. No oropharyngeal exudate or posterior oropharyngeal erythema.  Eyes:     Extraocular Movements:  Extraocular movements intact.  Cardiovascular:     Rate and Rhythm: Normal rate and regular rhythm.     Pulses: Normal pulses.  Radial pulses are 2+ on the right side and 2+ on the left side.       Dorsalis pedis pulses are 2+ on the right side and 2+ on the left side.     Heart sounds: Normal heart sounds. Heart sounds not distant. No murmur heard.  No systolic murmur is present. No friction rub. No gallop.      Comments: Regular rate and rhythm without murmurs, rubs, or gallops. Pulmonary:     Effort: Pulmonary effort is normal. No tachypnea, accessory muscle usage or respiratory distress.     Breath sounds: Normal breath sounds. No stridor. No decreased breath sounds, wheezing, rhonchi or rales.     Comments: Lungs are clear to auscultation bilaterally.  No wheezing, rales, or rhonchi.  Oxygen saturations in the high 90s on room air.  Speaking in clear and complete sentences.  No respiratory distress noted. Abdominal:     General: Abdomen is flat.     Tenderness: There is no abdominal tenderness.  Musculoskeletal:        General: Normal range of motion.     Cervical back: Normal range of motion and neck supple. No tenderness.     Right lower leg: No tenderness. No edema.     Left lower leg: No tenderness. No edema.     Comments: No leg swelling.  No calf tenderness.  No palpable cords.  Skin:    General: Skin is warm and dry.  Neurological:     General: No focal deficit present.     Mental Status: She is alert and oriented to person, place, and time.  Psychiatric:        Mood and Affect: Mood normal.        Behavior: Behavior normal.    ED Results / Procedures / Treatments   Labs (all labs ordered are listed, but only abnormal results are displayed) Labs Reviewed  BASIC METABOLIC PANEL - Abnormal; Notable for the following components:      Result Value   Calcium 8.8 (*)    All other components within normal limits  CBC  D-DIMER, QUANTITATIVE (NOT AT [facility])  BRAIN  NATRIURETIC PEPTIDE  I-STAT BETA HCG BLOOD, ED ([facility], [facility], [facility] ONLY)  TROPONIN I (HIGH SENSITIVITY)  TROPONIN I (HIGH SENSITIVITY)  TROPONIN I (HIGH SENSITIVITY)   EKG EKG Interpretation  Date/Time:  Wednesday February 17 2020 00:31:29 EST Ventricular Rate:  81 PR Interval:  158 QRS Duration: 78 QT Interval:  352 QTC Calculation: 408 R Axis:   62 Text Interpretation: Normal sinus rhythm Possible Anterior infarct , age undetermined Abnormal ECG Confirmed by Benjiman Core [phone]) on 02/17/2020 2:31:27 PM  Radiology DG Chest 2 View  Result Date: 02/17/2020 CLINICAL DATA:  Shortness of breath EXAM: CHEST - 2 VIEW COMPARISON:  Radiograph 11/14/2019 FINDINGS: Some increasing hazy interstitial opacity in both lungs with mild cuffing as well as pulmonary vascular congestion and redistribution. Cardiac silhouette is likely within normal limits for portable technique. Remaining cardiomediastinal contours are unremarkable. No acute osseous or soft tissue abnormality. IMPRESSION: Features could suggest mild interstitial edema and/or atelectasis with vascular congestion. Electronically Signed   By: Kreg Shropshire M.D.   On: 02/17/2020 01:23    Procedures Procedures (including critical care time)  Medications Ordered in ED Medications  albuterol (VENTOLIN HFA) 108 (90 Base) MCG/ACT inhaler 2 puff (has no administration in time range)   ED Course  I have reviewed the triage vital signs and the nursing notes.  Pertinent labs &  imaging results that were available during my care of the patient were reviewed by me and considered in my medical decision making (see chart for details).  Clinical Course as of 02/17/20 1431  Wed Feb 17, 2020  1159 DG Chest 2 View IMPRESSION: Features could suggest mild interstitial edema and/or atelectasis with vascular congestion. [LJ]  1159 D-Dimer, Quant: 0.42 [LJ]  1159 B Natriuretic Peptide: 17.8 [LJ]  1425 Troponin I (High Sensitivity): 5 [LJ]    Clinical  Course User Index [LJ] Placido Sou, PA-C   MDM Rules/Calculators/A&P                          Patient is a 39 year old female with a history of PE who presents to the emergency department due to chest tightness and shortness of breath when climbing a set of stairs last night at work.  Given her history of PE, she was concerned and came to the emergency department for evaluation.  Basic labs today are all reassuring.  No elevation in patient's D-dimer.  Given her significant wait due to patient boarding, a third troponin was obtained as well.  Troponin flat.  Chest x-ray showed possible mild interstitial edema and/or atelectasis with vascular congestion.  BNP was obtained which was not elevated at 17.8.  Heart score of 3.  Doubt ACS, CHF, or blood clot.  Discussed with patient at length.  She is quite reassured.  Feels comfortable being discharged at this time.  Feel the patient is safe for discharge at this time.  Patient has follow-up next month with cardiology.  Her vital signs are all within normal limits.  Final Clinical Impression(s) / ED Diagnoses Final diagnoses:  Feeling of chest tightness    Rx / DC Orders ED Discharge Orders    None       Placido Sou, PA-C 02/17/20 1433    Placido Sou, PA-C 02/17/20 1439    Benjiman Core, MD 02/17/20 1620

## 2020-02-17 NOTE — ED Triage Notes (Signed)
Pt complaining of CP/SHOB onset 2100. States sudden onset when she was climbing steps, has not been able to get a full, deep breath with exertion. Pt endorses hx of PE in 2015, discharged on eliquis for 64mos. Hx HTN, compliant with all prescribed medications.

## 2020-02-17 NOTE — Discharge Instructions (Addendum)
Like we discussed, please make sure you follow-up with your cardiologist next month at your scheduled appointment.  If your symptoms return or worsen, please return to the emergency department for reevaluation.  It was a pleasure to meet you.

## 2020-03-02 ENCOUNTER — Other Ambulatory Visit: Payer: 59

## 2020-07-08 ENCOUNTER — Emergency Department (HOSPITAL_COMMUNITY): Payer: 59

## 2020-07-08 ENCOUNTER — Emergency Department (HOSPITAL_COMMUNITY)
Admission: EM | Admit: 2020-07-08 | Discharge: 2020-07-09 | Disposition: A | Discharge status: Home / Self Care | Payer: 59 | Attending: Emergency Medicine | Admitting: Emergency Medicine

## 2020-07-08 ENCOUNTER — Other Ambulatory Visit: Payer: Self-pay

## 2020-07-08 ENCOUNTER — Encounter (HOSPITAL_COMMUNITY): Payer: Self-pay

## 2020-07-08 DIAGNOSIS — Z79899 Other long term (current) drug therapy: Secondary | ICD-10-CM | POA: Diagnosis not present

## 2020-07-08 DIAGNOSIS — I10 Essential (primary) hypertension: Secondary | ICD-10-CM | POA: Insufficient documentation

## 2020-07-08 DIAGNOSIS — R002 Palpitations: Secondary | ICD-10-CM | POA: Insufficient documentation

## 2020-07-08 DIAGNOSIS — Z7982 Long term (current) use of aspirin: Secondary | ICD-10-CM | POA: Insufficient documentation

## 2020-07-08 LAB — CBC WITH DIFFERENTIAL/PLATELET
Abs Immature Granulocytes: 0.01 10*3/uL (ref 0.00–0.07)
Basophils Absolute: 0 10*3/uL (ref 0.0–0.1)
Basophils Relative: 1 %
Eosinophils Absolute: 0.1 10*3/uL (ref 0.0–0.5)
Eosinophils Relative: 1 %
HCT: 38.1 % (ref 36.0–46.0)
Hemoglobin: 12.2 g/dL (ref 12.0–15.0)
Immature Granulocytes: 0 %
Lymphocytes Relative: 45 %
Lymphs Abs: 3.5 10*3/uL (ref 0.7–4.0)
MCH: 29.8 pg (ref 26.0–34.0)
MCHC: 32 g/dL (ref 30.0–36.0)
MCV: 92.9 fL (ref 80.0–100.0)
Monocytes Absolute: 0.7 10*3/uL (ref 0.1–1.0)
Monocytes Relative: 10 %
Neutro Abs: 3.2 10*3/uL (ref 1.7–7.7)
Neutrophils Relative %: 43 %
Platelets: 330 10*3/uL (ref 150–400)
RBC: 4.1 MIL/uL (ref 3.87–5.11)
RDW: 14 % (ref 11.5–15.5)
WBC: 7.6 10*3/uL (ref 4.0–10.5)
nRBC: 0 % (ref 0.0–0.2)

## 2020-07-08 LAB — TROPONIN I (HIGH SENSITIVITY)
Troponin I (High Sensitivity): 6 ng/L (ref <18)
Troponin I (High Sensitivity): 6 ng/L (ref <18)

## 2020-07-08 LAB — COMPREHENSIVE METABOLIC PANEL
ALT: 12 U/L (ref 0–44)
AST: 19 U/L (ref 15–41)
Albumin: 3.7 g/dL (ref 3.5–5.0)
Alkaline Phosphatase: 77 U/L (ref 38–126)
Anion gap: 6 (ref 5–15)
BUN: 9 mg/dL (ref 6–20)
CO2: 27 mmol/L (ref 22–32)
Calcium: 9.1 mg/dL (ref 8.9–10.3)
Chloride: 104 mmol/L (ref 98–111)
Creatinine, Ser: 0.69 mg/dL (ref 0.44–1.00)
GFR, Estimated: >60 mL/min (ref >60)
Glucose, Bld: 114 mg/dL — ABNORMAL HIGH (ref 70–99)
Potassium: 3.6 mmol/L (ref 3.5–5.1)
Sodium: 137 mmol/L (ref 135–145)
Total Bilirubin: 0.7 mg/dL (ref 0.3–1.2)
Total Protein: 7.6 g/dL (ref 6.5–8.1)

## 2020-07-08 NOTE — ED Provider Notes (Signed)
[facility] EMERGENCY DEPARTMENT Provider Note   CSN: [id number] Arrival date & time: 07/08/20  1904     History Chief Complaint  Patient presents with  . Palpitations    Mindy Wade is a 40 y.o. female.  Patient presents to the emergency department for evaluation of heart palpitations.  Patient reports that she has been experiencing intermittent episodes of palpitations over the past week.  Tonight she had a 3 minute episode of rapid and irregular heartbeat.  She reports that at first it felt like it was irregular, then it went fast.  She does feel that some of that might of been anxiety.  No significant shortness of breath associated with the episode.  No chest pain.        Past Medical History:  Diagnosis Date  . AMA (advanced maternal age) multigravida 35+   . Breast feeding status of mother   . GERD (gastroesophageal reflux disease)   . Headache   . Hx of blood clots   . Hx of varicella   . Hyperlipemia   . Hypertension    takes meds  . Pregnancy induced hypertension   . Pulmonary embolism [facility])     Patient Active Problem List   Diagnosis Date Noted  . Term pregnancy 04/25/2018  . Vaginal delivery 04/25/2018  . Adherent placenta 04/25/2018  . Missed abortion with fetal demise before 20 completed weeks of gestation 05/03/2017  . Precipitous delivery 02/19/2015  . Postpartum care following vaginal delivery 02/19/2015  . Active labor at term 02/18/2015  . Pre-diabetes 05/14/2014  . Elevated LDL cholesterol level 05/14/2014  . Pulmonary embolism (HCC) 10/17/2013  . Labor and delivery indication for care or intervention 10/09/2013  . Spontaneous abortion in second trimester 10/09/2013  . SVD (spontaneous vaginal delivery) 03/02/2013    Past Surgical History:  Procedure Laterality Date  . CHOLECYSTECTOMY       OB History    Gravida  9   Para  8   Term  7   Preterm  1   AB  1   Living  7     SAB  0   IAB  1    Ectopic      Multiple  0   Live Births  8           Family History  Problem Relation Age of Onset  . Asthma Mother   . Heart disease Mother   . Hypertension Mother   . Stroke Mother   . Heart attack Mother 71  . Cancer Father        prostate  . Alcohol abuse Neg Hx   . Arthritis Neg Hx   . Birth defects Neg Hx   . COPD Neg Hx   . Depression Neg Hx   . Diabetes Neg Hx   . Drug abuse Neg Hx   . Early death Neg Hx   . Hearing loss Neg Hx   . Hyperlipidemia Neg Hx   . Kidney disease Neg Hx   . Learning disabilities Neg Hx   . Mental illness Neg Hx   . Mental retardation Neg Hx   . Miscarriages / Stillbirths Neg Hx   . Vision loss Neg Hx   . Varicose Veins Neg Hx     Social History   Tobacco Use  . Smoking status: Never Smoker  . Smokeless tobacco: Never Used  Vaping Use  . Vaping Use: Never used  Substance Use Topics  . Alcohol  use: No  . Drug use: No    Home Medications Prior to Admission medications   Medication Sig Start Date End Date Taking? Authorizing Provider  acetaminophen (TYLENOL) 500 MG tablet Take 1,000 mg by mouth every 6 (six) hours as needed for moderate pain, mild pain or headache.   Yes [provider]  aspirin EC 81 MG tablet Take 81 mg by mouth daily.   Yes [provider]  losartan-hydrochlorothiazide (HYZAAR) 100-25 MG tablet Take 1 tablet by mouth every evening. 06/26/20  Yes [provider]  metoprolol tartrate (LOPRESSOR) 25 MG tablet Take 25 mg by mouth 2 (two) times daily as needed (palpitations).   Yes [provider]  Multiple Vitamin (MULTIVITAMIN WITH MINERALS) TABS tablet Take 1 tablet by mouth daily.   Yes [provider]  paragard intrauterine copper IUD IUD by Intrauterine route continuous. Since 01/2019.   Yes [provider]  rosuvastatin (CRESTOR) 10 MG tablet Take 10 mg by mouth at bedtime. 01/18/20  Yes [provider]  famotidine (PEPCID) 20 MG tablet Take 1  tablet (20 mg total) by mouth 2 (two) times daily for 5 days. Patient not taking: No sig reported 11/14/19 11/19/19  Joy, Hillard DankerShawn C, PA-C  meclizine (ANTIVERT) 25 MG tablet Take 1 tablet (25 mg total) by mouth 3 (three) times daily as needed for dizziness or nausea. Patient not taking: No sig reported 05/21/19   Wieters, Hallie C, PA-C  metaxalone (SKELAXIN) 800 MG tablet Take 1 tablet (800 mg total) by mouth 3 (three) times daily. Patient not taking: No sig reported 07/17/19   Lorre NickAllen, Anthony, MD  ondansetron (ZOFRAN ODT) 4 MG disintegrating tablet Take 1 tablet (4 mg total) by mouth every 8 (eight) hours as needed for nausea or vomiting. Patient not taking: No sig reported 05/23/19   Gailen ShelterFondaw, Wylder S, PA  pantoprazole (PROTONIX) 20 MG tablet Take 1 tablet (20 mg total) by mouth daily. Patient not taking: Reported on 07/08/2020 11/14/19 01/13/20  Joy, Ines BloomerShawn C, PA-C  enoxaparin (LOVENOX) 150 MG/ML injection Inject 0.73 mLs (110 mg total) into the skin daily. Patient not taking: Reported on 01/14/2019 09/22/18 01/14/19  Antony MaduraHumes, Kelly, PA-C  omeprazole (PRILOSEC) 20 MG capsule Take 1 capsule (20 mg total) by mouth daily. Patient not taking: Reported on 09/22/2018 07/05/18 01/14/19  Renne CriglerGeiple, Joshua, PA-C    Allergies    Penicillins  Review of Systems   Review of Systems  Cardiovascular: Positive for palpitations.  All other systems reviewed and are negative.   Physical Exam Updated Vital Signs BP (!) 100/55   Pulse 61   Temp 99 F (37.2 C) (Oral)   Resp 18   Ht 5\' 8"  (1.727 m)   Wt 122.5 kg   SpO2 99%   BMI 41.05 kg/m   Physical Exam Vitals and nursing note reviewed.  Constitutional:      General: She is not in acute distress.    Appearance: Normal appearance. She is well-developed.  HENT:     Head: Normocephalic and atraumatic.     Right Ear: Hearing normal.     Left Ear: Hearing normal.     Nose: Nose normal.  Eyes:     Conjunctiva/sclera: Conjunctivae normal.     Pupils: Pupils are  equal, round, and reactive to light.  Cardiovascular:     Rate and Rhythm: Regular rhythm.     Heart sounds: S1 normal and S2 normal. No murmur heard. No friction rub. No gallop.   Pulmonary:  Effort: Pulmonary effort is normal. No respiratory distress.     Breath sounds: Normal breath sounds.  Chest:     Chest wall: No tenderness.  Abdominal:     General: Bowel sounds are normal.     Palpations: Abdomen is soft.     Tenderness: There is no abdominal tenderness. There is no guarding or rebound. Negative signs include Murphy's sign and McBurney's sign.     Hernia: No hernia is present.  Musculoskeletal:        General: Normal range of motion.     Cervical back: Normal range of motion and neck supple.  Skin:    General: Skin is warm and dry.     Findings: No rash.  Neurological:     Mental Status: She is alert and oriented to person, place, and time.     GCS: GCS eye subscore is 4. GCS verbal subscore is 5. GCS motor subscore is 6.     Cranial Nerves: No cranial nerve deficit.     Sensory: No sensory deficit.     Coordination: Coordination normal.  Psychiatric:        Speech: Speech normal.        Behavior: Behavior normal.        Thought Content: Thought content normal.     ED Results / Procedures / Treatments   Labs (all labs ordered are listed, but only abnormal results are displayed) Labs Reviewed  COMPREHENSIVE METABOLIC PANEL - Abnormal; Notable for the following components:      Result Value   Glucose, Bld 114 (*)    All other components within normal limits  CBC WITH DIFFERENTIAL/PLATELET  D-DIMER, QUANTITATIVE  TROPONIN I (HIGH SENSITIVITY)  TROPONIN I (HIGH SENSITIVITY)    EKG EKG Interpretation  Date/Time:  Friday Jul 08 2020 19:21:25 EDT Ventricular Rate:  135 PR Interval:  140 QRS Duration: 86 QT Interval:  284 QTC Calculation: 426 R Axis:   87 Text Interpretation: Sinus tachycardia Nonspecific ST abnormality Abnormal ECG Confirmed by Gilda Crease ([id number]) on 07/08/2020 11:24:57 PM   Radiology DG Chest Port 1 View  Result Date: 07/08/2020 CLINICAL DATA:  Chest pain EXAM: PORTABLE CHEST 1 VIEW COMPARISON:  February 17, 2020 the FINDINGS: The heart size and mediastinal contours are within normal limits. No focal consolidation. No significant pleural effusion or visible pneumothorax. The visualized skeletal structures are unremarkable. IMPRESSION: No acute cardiopulmonary process. Electronically Signed   By: Maudry Mayhew MD   On: 07/08/2020 21:08    Procedures Procedures   Medications Ordered in ED Medications - No data to display  ED Course  I have reviewed the triage vital signs and the nursing notes.  Pertinent labs & imaging results that were available during my care of the patient were reviewed by me and considered in my medical decision making (see chart for details).    MDM Rules/Calculators/A&P                          Patient presents with complaints of heart palpitations.  Patient tachycardic at arrival.  No arrhythmia noted.  Cardiac work-up has been normal.  Reviewing her records, she does have a history of PE.  She reports that it was felt that her PE was precipitated by pregnancy, is not currently anticoagulated.  Tachycardia has resolved without intervention.  Patient admits that she was feeling anxious at arrival.  She was tachycardic and hypertensive, both of which have now normalized.  It is felt that her tachycardia is not likely secondary to PE.  Based on her tachycardia and previous PE, Wells criteria is 3 points.  This is a moderate risk in the 3 tier model, with a negative D-dimer, however, no further work-up necessary.  Patient does have a cardiologist.  She is felt to be low risk at this point for significant arrhythmia.  She will follow-up with her cardiologist on an outpatient for further monitoring.  Final Clinical Impression(s) / ED Diagnoses Final diagnoses:  Heart palpitations    Rx  / DC Orders ED Discharge Orders    None       Marissa Lowrey, Canary Brim, MD 07/09/20 [phone]

## 2020-07-08 NOTE — ED Provider Notes (Signed)
Emergency Medicine Provider Triage Evaluation Note  Mindy Wade , a 40 y.o. female  was evaluated in triage.  Pt complains of chest pain and palpitations.  This lasted for approximately 3 minutes and then resolved.  Patient has had intermittent episodes since then.  First episode of palpitations started at 710.  Endorses associated shortness of breath.  Reports that she has had palpitations in the past however they do not last this long.  Patient does take metoprolol daily for palpitations.  The last few days she has been having dizzy spells.      Review of Systems  Positive: Chest pain, palpitations, shortness of breath Negative: Nausea, vomiting, lightheadedness, dizziness, syncopal episode, abdominal pain  Physical Exam  BP (!) 162/94 (BP Location: Right Arm)   Pulse (!) 115   Temp 99 F (37.2 C) (Oral)   Resp 18   SpO2 96%  Gen:   Awake, no distress, cheerful affect Resp:  Normal effort, lungs clear to auscultation bilaterally MSK:   Moves extremities without difficulty  Other:  No murmurs rubs or gallops on cardiac auscultation  Medical Decision Making  Medically screening exam initiated at 7:20 PM.  Appropriate orders placed.  Theodis Blaze was informed that the remainder of the evaluation will be completed by another provider, this initial triage assessment does not replace that evaluation, and the importance of remaining in the ED until their evaluation is complete.  The patient appears stable so that the remainder of the work up may be completed by another provider.      Haskel Schroeder, PA-C 07/08/20 1927    Gilda Crease, MD 07/08/20 [phone]

## 2020-07-08 NOTE — ED Triage Notes (Signed)
Patient reports she was going to the grocery store and her heart felt out of heart and then started with palpitations, reports the episode lasted about 3 minutes, has been having intermittent episodes of it, denies chest pain and cardiac hx, reports she has HTN, states she takes metoprolol PRN for fluttering.

## 2020-07-09 LAB — D-DIMER, QUANTITATIVE: D-Dimer, Quant: 0.36 ug/mL-FEU (ref 0.00–0.50)

## 2020-07-09 NOTE — ED Notes (Signed)
PA at bedside.

## 2020-12-15 ENCOUNTER — Other Ambulatory Visit: Payer: Self-pay

## 2020-12-15 ENCOUNTER — Emergency Department (HOSPITAL_COMMUNITY): Payer: 59

## 2020-12-15 ENCOUNTER — Observation Stay (HOSPITAL_COMMUNITY)
Admission: EM | Admit: 2020-12-15 | Discharge: 2020-12-17 | Disposition: A | Discharge status: Home / Self Care | Payer: 59 | Attending: Internal Medicine | Admitting: Internal Medicine

## 2020-12-15 DIAGNOSIS — E782 Mixed hyperlipidemia: Secondary | ICD-10-CM | POA: Diagnosis present

## 2020-12-15 DIAGNOSIS — I2699 Other pulmonary embolism without acute cor pulmonale: Secondary | ICD-10-CM | POA: Diagnosis not present

## 2020-12-15 DIAGNOSIS — Z20822 Contact with and (suspected) exposure to covid-19: Secondary | ICD-10-CM | POA: Diagnosis not present

## 2020-12-15 DIAGNOSIS — R7989 Other specified abnormal findings of blood chemistry: Secondary | ICD-10-CM | POA: Insufficient documentation

## 2020-12-15 DIAGNOSIS — K219 Gastro-esophageal reflux disease without esophagitis: Secondary | ICD-10-CM | POA: Diagnosis present

## 2020-12-15 DIAGNOSIS — I10 Essential (primary) hypertension: Secondary | ICD-10-CM | POA: Diagnosis present

## 2020-12-15 DIAGNOSIS — R0602 Shortness of breath: Secondary | ICD-10-CM | POA: Diagnosis present

## 2020-12-15 DIAGNOSIS — R778 Other specified abnormalities of plasma proteins: Secondary | ICD-10-CM | POA: Diagnosis present

## 2020-12-15 LAB — I-STAT BETA HCG BLOOD, ED (MC, WL, AP ONLY): I-stat hCG, quantitative: <5 m[IU]/mL (ref <5)

## 2020-12-15 LAB — COMPREHENSIVE METABOLIC PANEL
ALT: 14 U/L (ref 0–44)
AST: 18 U/L (ref 15–41)
Albumin: 3.3 g/dL — ABNORMAL LOW (ref 3.5–5.0)
Alkaline Phosphatase: 71 U/L (ref 38–126)
Anion gap: 7 (ref 5–15)
BUN: 15 mg/dL (ref 6–20)
CO2: 25 mmol/L (ref 22–32)
Calcium: 9 mg/dL (ref 8.9–10.3)
Chloride: 107 mmol/L (ref 98–111)
Creatinine, Ser: 0.95 mg/dL (ref 0.44–1.00)
GFR, Estimated: >60 mL/min (ref >60)
Glucose, Bld: 93 mg/dL (ref 70–99)
Potassium: 3.7 mmol/L (ref 3.5–5.1)
Sodium: 139 mmol/L (ref 135–145)
Total Bilirubin: 0.3 mg/dL (ref 0.3–1.2)
Total Protein: 7.4 g/dL (ref 6.5–8.1)

## 2020-12-15 LAB — CBC WITH DIFFERENTIAL/PLATELET
Abs Immature Granulocytes: 0.01 10*3/uL (ref 0.00–0.07)
Basophils Absolute: 0 10*3/uL (ref 0.0–0.1)
Basophils Relative: 1 %
Eosinophils Absolute: 0.1 10*3/uL (ref 0.0–0.5)
Eosinophils Relative: 1 %
HCT: 39 % (ref 36.0–46.0)
Hemoglobin: 12.8 g/dL (ref 12.0–15.0)
Immature Granulocytes: 0 %
Lymphocytes Relative: 30 %
Lymphs Abs: 1.8 10*3/uL (ref 0.7–4.0)
MCH: 30.1 pg (ref 26.0–34.0)
MCHC: 32.8 g/dL (ref 30.0–36.0)
MCV: 91.8 fL (ref 80.0–100.0)
Monocytes Absolute: 0.5 10*3/uL (ref 0.1–1.0)
Monocytes Relative: 8 %
Neutro Abs: 3.6 10*3/uL (ref 1.7–7.7)
Neutrophils Relative %: 60 %
Platelets: 267 10*3/uL (ref 150–400)
RBC: 4.25 MIL/uL (ref 3.87–5.11)
RDW: 14.2 % (ref 11.5–15.5)
WBC: 6.1 10*3/uL (ref 4.0–10.5)
nRBC: 0 % (ref 0.0–0.2)

## 2020-12-15 LAB — D-DIMER, QUANTITATIVE: D-Dimer, Quant: 11.12 ug/mL-FEU — ABNORMAL HIGH (ref 0.00–0.50)

## 2020-12-15 LAB — TROPONIN I (HIGH SENSITIVITY): Troponin I (High Sensitivity): 9 ng/L (ref <18)

## 2020-12-15 NOTE — ED Provider Notes (Signed)
Emergency Medicine Provider Triage Evaluation Note  Mindy Wade , a 40 y.o. female  was evaluated in triage.  Pt complains of shortness of breath that started at 4 PM today.  Been constant, worsened by movement.  Patient is normally very agile was walking to work but had to stop twice in the parking lot which is very atypical.  She is also having some chest discomfort that is worse with exertion and stops when she sits.  Patient has a history of PE in 2015, she is not on anticoagulation anymore..  Uses IUD for birth control, no recent surgeries or travel.  Review of Systems  Positive: Chest discomfort, shortness of breath Negative: Fever, cough, wheezing  Physical Exam  BP (!) 133/96 (BP Location: Right Arm)   Pulse (!) 110   Temp 98.5 F (36.9 C) (Oral)   Resp 20   SpO2 96%  Gen:   Awake, no distress   Resp:  Normal effort  MSK:   Moves extremities without difficulty  Other:  Lungs are clear to auscultation bilaterally  Medical Decision Making  Medically screening exam initiated at 8:14 PM.  Appropriate orders placed.  Theodis Blaze was informed that the remainder of the evaluation will be completed by another provider, this initial triage assessment does not replace that evaluation, and the importance of remaining in the ED until their evaluation is complete.  Chest pain work-up.  She is tachycardic, vague chest pain with previous history of PE.  Patient may benefit from a CTA.  I do elect to start with a dimer given risk factors.   Theron Arista, PA-C 12/15/20 2015    Rozelle Logan, DO 12/15/20 2355

## 2020-12-15 NOTE — ED Triage Notes (Signed)
Pt c/o shortness of breath upon exertion.

## 2020-12-16 ENCOUNTER — Encounter (HOSPITAL_COMMUNITY): Payer: Self-pay | Admitting: Internal Medicine

## 2020-12-16 ENCOUNTER — Emergency Department (HOSPITAL_COMMUNITY): Payer: 59

## 2020-12-16 DIAGNOSIS — R778 Other specified abnormalities of plasma proteins: Secondary | ICD-10-CM

## 2020-12-16 DIAGNOSIS — I2699 Other pulmonary embolism without acute cor pulmonale: Secondary | ICD-10-CM | POA: Diagnosis not present

## 2020-12-16 DIAGNOSIS — K219 Gastro-esophageal reflux disease without esophagitis: Secondary | ICD-10-CM

## 2020-12-16 DIAGNOSIS — I10 Essential (primary) hypertension: Secondary | ICD-10-CM | POA: Diagnosis not present

## 2020-12-16 DIAGNOSIS — E782 Mixed hyperlipidemia: Secondary | ICD-10-CM

## 2020-12-16 LAB — CBC WITH DIFFERENTIAL/PLATELET
Abs Immature Granulocytes: 0.01 10*3/uL (ref 0.00–0.07)
Basophils Absolute: 0 10*3/uL (ref 0.0–0.1)
Basophils Relative: 1 %
Eosinophils Absolute: 0.2 10*3/uL (ref 0.0–0.5)
Eosinophils Relative: 2 %
HCT: 37.8 % (ref 36.0–46.0)
Hemoglobin: 12.2 g/dL (ref 12.0–15.0)
Immature Granulocytes: 0 %
Lymphocytes Relative: 39 %
Lymphs Abs: 3 10*3/uL (ref 0.7–4.0)
MCH: 30 pg (ref 26.0–34.0)
MCHC: 32.3 g/dL (ref 30.0–36.0)
MCV: 92.9 fL (ref 80.0–100.0)
Monocytes Absolute: 0.5 10*3/uL (ref 0.1–1.0)
Monocytes Relative: 7 %
Neutro Abs: 4 10*3/uL (ref 1.7–7.7)
Neutrophils Relative %: 51 %
Platelets: 250 10*3/uL (ref 150–400)
RBC: 4.07 MIL/uL (ref 3.87–5.11)
RDW: 14.3 % (ref 11.5–15.5)
WBC: 7.7 10*3/uL (ref 4.0–10.5)
nRBC: 0 % (ref 0.0–0.2)

## 2020-12-16 LAB — RESP PANEL BY RT-PCR (FLU A&B, COVID) ARPGX2
Influenza A by PCR: NEGATIVE
Influenza B by PCR: NEGATIVE
SARS Coronavirus 2 by RT PCR: NEGATIVE

## 2020-12-16 LAB — HIV ANTIBODY (ROUTINE TESTING W REFLEX): HIV Screen 4th Generation wRfx: NONREACTIVE

## 2020-12-16 LAB — TROPONIN I (HIGH SENSITIVITY)
Troponin I (High Sensitivity): 23 ng/L — ABNORMAL HIGH (ref <18)
Troponin I (High Sensitivity): 12 ng/L (ref <18)

## 2020-12-16 LAB — HEPARIN LEVEL (UNFRACTIONATED)
Heparin Unfractionated: 0.51 IU/mL (ref 0.30–0.70)
Heparin Unfractionated: 0.47 IU/mL (ref 0.30–0.70)

## 2020-12-16 MED ORDER — PANTOPRAZOLE SODIUM 20 MG PO TBEC
20.0000 mg | DELAYED_RELEASE_TABLET | Freq: Every day | ORAL | Status: DC
  Administered 2020-12-16 – 2020-12-17 (×2): 20 mg via ORAL
  Filled 2020-12-16 (×2): qty 1

## 2020-12-16 MED ORDER — HEPARIN (PORCINE) 25000 UT/250ML-% IV SOLN
1450.0000 [IU]/h | INTRAVENOUS | Status: DC
  Administered 2020-12-16 (×2): 1400 [IU]/h via INTRAVENOUS
  Filled 2020-12-16 (×3): qty 250

## 2020-12-16 MED ORDER — ACETAMINOPHEN 325 MG PO TABS: 650.0000 mg | ORAL_TABLET | Freq: Four times a day (QID) | ORAL | Status: DC | PRN

## 2020-12-16 MED ORDER — ASPIRIN EC 81 MG PO TBEC
81.0000 mg | DELAYED_RELEASE_TABLET | Freq: Every day | ORAL | Status: DC
  Administered 2020-12-16 – 2020-12-17 (×2): 81 mg via ORAL
  Filled 2020-12-16 (×2): qty 1

## 2020-12-16 MED ORDER — IOHEXOL 350 MG/ML SOLN
80.0000 mL | Freq: Once | INTRAVENOUS | Status: AC | PRN
  Administered 2020-12-16: 80 mL via INTRAVENOUS

## 2020-12-16 MED ORDER — ROSUVASTATIN CALCIUM 5 MG PO TABS
10.0000 mg | ORAL_TABLET | Freq: Every day | ORAL | Status: DC
  Administered 2020-12-16: 10 mg via ORAL
  Filled 2020-12-16: qty 2

## 2020-12-16 MED ORDER — HYDRALAZINE HCL 20 MG/ML IJ SOLN: 10.0000 mg | Freq: Four times a day (QID) | INTRAMUSCULAR | Status: DC | PRN

## 2020-12-16 MED ORDER — ONDANSETRON HCL 4 MG/2ML IJ SOLN: 4.0000 mg | Freq: Four times a day (QID) | INTRAMUSCULAR | Status: DC | PRN

## 2020-12-16 MED ORDER — HYDROCHLOROTHIAZIDE 25 MG PO TABS
25.0000 mg | ORAL_TABLET | Freq: Every day | ORAL | Status: DC
  Administered 2020-12-17: 25 mg via ORAL
  Filled 2020-12-16 (×2): qty 1

## 2020-12-16 MED ORDER — ONDANSETRON HCL 4 MG PO TABS: 4.0000 mg | ORAL_TABLET | Freq: Four times a day (QID) | ORAL | Status: DC | PRN

## 2020-12-16 MED ORDER — POLYETHYLENE GLYCOL 3350 17 G PO PACK: 17.0000 g | PACK | Freq: Every day | ORAL | Status: DC | PRN

## 2020-12-16 MED ORDER — LOSARTAN POTASSIUM 50 MG PO TABS
100.0000 mg | ORAL_TABLET | Freq: Every day | ORAL | Status: DC
  Administered 2020-12-17: 100 mg via ORAL
  Filled 2020-12-16 (×2): qty 2

## 2020-12-16 MED ORDER — OXYCODONE-ACETAMINOPHEN 5-325 MG PO TABS: 1.0000 | ORAL_TABLET | ORAL | Status: DC | PRN

## 2020-12-16 MED ORDER — ACETAMINOPHEN 650 MG RE SUPP: 650.0000 mg | Freq: Four times a day (QID) | RECTAL | Status: DC | PRN

## 2020-12-16 MED ORDER — LOSARTAN POTASSIUM-HCTZ 100-25 MG PO TABS: 1.0000 | ORAL_TABLET | Freq: Every evening | ORAL | Status: DC

## 2020-12-16 MED ORDER — HEPARIN BOLUS VIA INFUSION
6000.0000 [IU] | Freq: Once | INTRAVENOUS | Status: AC
  Administered 2020-12-16: 6000 [IU] via INTRAVENOUS
  Filled 2020-12-16: qty 6000

## 2020-12-16 NOTE — Assessment & Plan Note (Signed)
   Minimal elevation in cardiac enzymes  No evidence of chest pain being blocked rupture unlikely  Likely secondary to supply demand mismatch due to ongoing pulmonary embolism  Will continue to trend as long as uptrending  Monitoring patient on telemetry

## 2020-12-16 NOTE — Assessment & Plan Note (Signed)
Continuing home regimen of daily PPI therapy.  

## 2020-12-16 NOTE — ED Provider Notes (Signed)
Emergency Department Provider Note   I have reviewed the triage vital signs and the nursing notes.   HISTORY  Chief Complaint Chest Pain and Shortness of Breath   HPI Mindy Wade is a 40 y.o. female past medical history reviewed below including prior PE, no longer anticoagulation, presents emergency department with acute onset dyspnea.  No associated chest pain.  Symptoms began today which she noticed while walking into work.  She became very short of breath with fairly minimal exertion which is very unusual for her.  She denies history of asthma.  She is not a smoker.  She denies any fevers or chills.  No cough.  Denies any associated leg swelling or pain.  No recent surgeries or travel.  She notes that in 2015 she had a PE, thought to be provoked by a pregnancy, and completed a course of anticoagulation over approximately 6 months.  She was then taken off of this medication and has not had issues since that time.    Past Medical History:  Diagnosis Date   AMA (advanced maternal age) multigravida 35+    Breast feeding status of mother    GERD (gastroesophageal reflux disease)    Headache    Hx of blood clots    Hx of varicella    Hyperlipemia    Hypertension    takes meds   Pregnancy induced hypertension    Pulmonary embolism [facility])     Patient Active Problem List   Diagnosis Date Noted   Term pregnancy 04/25/2018   Vaginal delivery 04/25/2018   Adherent placenta 04/25/2018   Missed abortion with fetal demise before 20 completed weeks of gestation 05/03/2017   Precipitous delivery 02/19/2015   Postpartum care following vaginal delivery 02/19/2015   Active labor at term 02/18/2015   Pre-diabetes 05/14/2014   Elevated LDL cholesterol level 05/14/2014   Pulmonary embolism (HCC) 10/17/2013   Labor and delivery indication for care or intervention 10/09/2013   Spontaneous abortion in second trimester 10/09/2013   SVD (spontaneous vaginal delivery) 03/02/2013     Past Surgical History:  Procedure Laterality Date   CHOLECYSTECTOMY      Allergies Penicillins  Family History  Problem Relation Age of Onset   Asthma Mother    Heart disease Mother    Hypertension Mother    Stroke Mother    Heart attack Mother 36   Cancer Father        prostate   Alcohol abuse Neg Hx    Arthritis Neg Hx    Birth defects Neg Hx    COPD Neg Hx    Depression Neg Hx    Diabetes Neg Hx    Drug abuse Neg Hx    Early death Neg Hx    Hearing loss Neg Hx    Hyperlipidemia Neg Hx    Kidney disease Neg Hx    Learning disabilities Neg Hx    Mental illness Neg Hx    Mental retardation Neg Hx    Miscarriages / Stillbirths Neg Hx    Vision loss Neg Hx    Varicose Veins Neg Hx     Social History Social History   Tobacco Use   Smoking status: Never   Smokeless tobacco: Never  Vaping Use   Vaping Use: Never used  Substance Use Topics   Alcohol use: No   Drug use: No    Review of Systems  Constitutional: No fever/chills Eyes: No visual changes. ENT: No sore throat. Cardiovascular: Denies chest pain.  Respiratory: Positive exertional shortness of breath. Gastrointestinal: No abdominal pain.  No nausea, no vomiting.  No diarrhea.  No constipation. Genitourinary: Negative for dysuria. Musculoskeletal: Negative for back pain. Skin: Negative for rash. Neurological: Negative for headaches, focal weakness or numbness.  10-point ROS otherwise negative.  ____________________________________________   PHYSICAL EXAM:  VITAL SIGNS: ED Triage Vitals  Enc Vitals Group     BP 12/15/20 1957 (!) 133/96     Pulse Rate 12/15/20 1957 (!) 110     Resp 12/15/20 1957 20     Temp 12/15/20 1957 98.5 F (36.9 C)     Temp Source 12/15/20 1957 Oral     SpO2 12/15/20 1957 96 %     Weight 12/15/20 2014 260 lb (117.9 kg)     Height 12/15/20 2014 5\' 10"  (1.778 m)   Constitutional: Alert and oriented. Well appearing and in no acute distress. Eyes: Conjunctivae  are normal. Head: Atraumatic. Nose: No congestion/rhinnorhea. Mouth/Throat: Mucous membranes are moist.  Oropharynx non-erythematous. Neck: No stridor.   Cardiovascular: Tachycardia. Good peripheral circulation. Grossly normal heart sounds.   Respiratory: Normal respiratory effort.  No retractions. Lungs CTAB. Gastrointestinal: Soft and nontender. No distention.  Musculoskeletal: No lower extremity tenderness nor edema. No gross deformities of extremities. Neurologic:  Normal speech and language. No gross focal neurologic deficits are appreciated.  Skin:  Skin is warm, dry and intact. No rash noted.  ____________________________________________   LABS (all labs ordered are listed, but only abnormal results are displayed)  Labs Reviewed  D-DIMER, QUANTITATIVE - Abnormal; Notable for the following components:      Result Value   D-Dimer, Quant 11.12 (*)    All other components within normal limits  COMPREHENSIVE METABOLIC PANEL - Abnormal; Notable for the following components:   Albumin 3.3 (*)    All other components within normal limits  TROPONIN I (HIGH SENSITIVITY) - Abnormal; Notable for the following components:   Troponin I (High Sensitivity) 23 (*)    All other components within normal limits  RESP PANEL BY RT-PCR (FLU A&B, COVID) ARPGX2  CBC WITH DIFFERENTIAL/PLATELET  I-STAT BETA HCG BLOOD, ED ([facility], [facility], [facility] ONLY)  TROPONIN I (HIGH SENSITIVITY)   ____________________________________________  EKG   EKG Interpretation  Date/Time:  Thursday December 15 2020 20:07:09 EDT Ventricular Rate:  98 PR Interval:  160 QRS Duration: 86 QT Interval:  324 QTC Calculation: 413 R Axis:   75 Text Interpretation: Normal sinus rhythm Normal ECG Confirmed by 11[birth year]21[birth year]1976 [phone]) on 12/16/2020 12:52:08 AM        ____________________________________________  RADIOLOGY  DG Chest 2 View  Result Date: 12/15/2020 CLINICAL DATA:  Chest pain EXAM: CHEST - 2 VIEW COMPARISON:   07/08/2020 FINDINGS: The heart size and mediastinal contours are within normal limits. Both lungs are clear. The visualized skeletal structures are unremarkable. IMPRESSION: No active cardiopulmonary disease. Electronically Signed   By: 07/10/2020 M.D.   On: 12/15/2020 20:55   CT Angio Chest PE W and/or Wo Contrast  Result Date: 12/16/2020 CLINICAL DATA:  Chest pain, evaluate for PE EXAM: CT ANGIOGRAPHY CHEST WITH CONTRAST TECHNIQUE: Multidetector CT imaging of the chest was performed using the standard protocol during bolus administration of intravenous contrast. Multiplanar CT image reconstructions and MIPs were obtained to evaluate the vascular anatomy. CONTRAST:  9mL OMNIPAQUE IOHEXOL 350 MG/ML SOLN COMPARISON:  Chest radiographs dated 12/15/2020 FINDINGS: Cardiovascular: Satisfactory opacification of the bilateral pulmonary arteries to the segmental level. Lobar pulmonary emboli in the right upper lobe (series  5/image 56) and right middle/lower lobe pulmonary arteries (series 5/image 70). Lobar/segmental left lower lobe pulmonary emboli (series 5/image 81). Additional segmental pulmonary emboli in the left upper lobe (series 5/image 46). Overall clot burden is moderate to large. Although not tailored for evaluation of the thoracic aorta, there is no evidence of thoracic aortic aneurysm or dissection. The heart is normal in size. No pericardial effusion. No evidence of right heart strain (RV to LV ratio 0.88). Mediastinum/Nodes: No suspicious mediastinal lymphadenopathy. Visualized thyroid is unremarkable. Lungs/Pleura: Lungs are clear. No suspicious pulmonary nodules. No focal consolidation. No pleural effusion or pneumothorax. Upper Abdomen: Visualized upper abdomen is grossly unremarkable, noting vascular calcifications. Musculoskeletal: Visualized osseous structures are within normal limits. Review of the MIP images confirms the above findings. IMPRESSION: Lobar/segmental pulmonary emboli in all  lobes. Overall clot burden is moderate to large. No evidence of right heart strain. Critical Value/emergent results were called by telephone at the time of interpretation on 12/16/2020 at 2:10 am to provider Dr. Jacqulyn Bath, who verbally acknowledged these results. Electronically Signed   By: Charline Bills M.D.   On: 12/16/2020 02:13    ____________________________________________   PROCEDURES  Procedure(s) performed:   Procedures  CRITICAL CARE Performed by: Maia Plan Total critical care time: 35 minutes Critical care time was exclusive of separately billable procedures and treating other patients. Critical care was necessary to treat or prevent imminent or life-threatening deterioration. Critical care was time spent personally by me on the following activities: development of treatment plan with patient and/or surrogate as well as nursing, discussions with consultants, evaluation of patient's response to treatment, examination of patient, obtaining history from patient or surrogate, ordering and performing treatments and interventions, ordering and review of laboratory studies, ordering and review of radiographic studies, pulse oximetry and re-evaluation of patient's condition.  Alona Bene, MD Emergency Medicine  ____________________________________________   INITIAL IMPRESSION / ASSESSMENT AND PLAN / ED COURSE  Pertinent labs & imaging results that were available during my care of the patient were reviewed by me and considered in my medical decision making (see chart for details).   Patient presents emergency department with exertional shortness of breath without chest pain.  Has prior history of PE which was thought to be provoked by pregnancy.  She completed an anticoagulation course and is no longer on anticoagulation.  She arrives with tachycardia.  EKG interpreted by me as above with no acute ischemic change or evidence of right heart strain.  She not having pain but with acute  onset shortness of breath labs during the MSE process were ordered including D-dimer which came back very high.  Explained these results to the patient and will send her for a CTA to evaluate PE further.  Troponin initially within normal limits but has come back now mildly elevated into the 20s. Other considerations include infectious process such as CAP, COVID, Flu or perhaps new CHF. Low suspicion clinically for ACS.   02:12 AM  Spoke with radiology regarding the CT imaging consistent with bilateral PE.  No CT evidence of right heart strain although the patient's second troponin is mildly elevated now at 23.  Do plan for heparin and will admit given significant clot burden and mildly elevated troponin.  Patient likely would benefit from further monitoring and +/- ECHO.   02:30 AM  Discussed patient's case with [facility] to request admission. Patient and family (if present) updated with plan. Care transferred to [facility] service.  I reviewed all nursing notes, vitals, pertinent old  records, EKGs, labs, imaging (as available).  ____________________________________________  FINAL CLINICAL IMPRESSION(S) / ED DIAGNOSES  Final diagnoses:  Acute pulmonary embolism without acute cor pulmonale, unspecified pulmonary embolism type (HCC)     MEDICATIONS GIVEN DURING THIS VISIT:  Medications  heparin bolus via infusion 6,000 Units (has no administration in time range)  heparin ADULT infusion 100 units/mL (25000 units/238mL) (has no administration in time range)  iohexol (OMNIPAQUE) 350 MG/ML injection 80 mL (80 mLs Intravenous Contrast Given 12/16/20 0200)     Note:  This document was prepared using Dragon voice recognition software and may include unintentional dictation errors.  Alona Bene, MD, [facility] Emergency Medicine    Angi Goodell, Arlyss Repress, MD 12/16/20 [phone]

## 2020-12-16 NOTE — Progress Notes (Signed)
Patient ID: Mindy Wade, female   DOB: 1980/06/08, 40 y.o.   MRN: [id number]  PROGRESS NOTE    Shontavia Mickel  NTI:[id number] DOB: 1980/03/14 DOA: 12/15/2020 PCP: Medicine, [facility]    Brief Narrative:  40 year old female with a prior history of pulmonary embolism that was unprovoked, hypertension, hyperlipidemia, GERD who presents with shortness of breath and chest pain.  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  Right   Assessment & Plan:   Active Problems:   Acute pulmonary embolism without acute cor pulmonale (HCC)   Essential hypertension   Mixed hyperlipidemia   GERD without esophagitis   Elevated troponin level not due myocardial infarction   Acute pulmonary embolism without cor pulmonale Heparin drip No evidence of right heart strain Transition to oral anticoagulant Telemetry  Elevated troponin Likely related to PE  Hypertension Continue Hyzaar Holding beta-blocker  Mixed hyperlipidemia Continue Crestor  GERD Continue PPI  DVT prophylaxis: QQ:[id number] gtt Code Status: Full code  Family Communication: Patient at bedside Disposition Plan: Home patient remains inpatient due to ongoing work-up and treatment with IV medications.   Consultants:  None  Procedures: None  Antimicrobials: Anti-infectives (From admission, onward)    None        Subjective: Patient feels well.  She denies hemoptysis, chest pain.  She reports shortness of breath only with exertion.  Objective: Vitals:   12/16/20 0700 12/16/20 0800 12/16/20 1000 12/16/20 1100  BP: 102/72 103/71 100/68 94/68  Pulse: 64 70 63 66  Resp: 17 16 16 15   Temp:      TempSrc:      SpO2: 96% 96% 96% 96%  Weight:      Height:       No intake or output data in the 24 hours ending 12/16/20 1303 Filed Weights   12/15/20 2014  Weight: 117.9 kg    Examination:  General exam: Appears calm and comfortable  Respiratory system: Clear to  auscultation. Respiratory effort normal. Cardiovascular system: S1 & S2 heard, RRR.  Gastrointestinal system: Abdomen is nondistended, soft and nontender.  Central nervous system: Alert and oriented. No focal neurological deficits. Extremities: Symmetric  Skin: No rashes Psychiatry: Judgement and insight appear normal. Mood & affect appropriate.     Data Reviewed: I have personally reviewed following labs and imaging studies  CBC: Recent Labs  Lab 12/15/20 2029 12/16/20 0534  WBC 6.1 7.7  NEUTROABS 3.6 4.0  HGB 12.8 12.2  HCT 39.0 37.8  MCV 91.8 92.9  PLT 267 250   Basic Metabolic Panel: Recent Labs  Lab 12/15/20 2029  NA 139  K 3.7  CL 107  CO2 25  GLUCOSE 93  BUN 15  CREATININE 0.95  CALCIUM 9.0   GFR: Estimated Creatinine Clearance: 108.6 mL/min (by C-G formula based on SCr of 0.95 mg/dL). Liver Function Tests: Recent Labs  Lab 12/15/20 2029  AST 18  ALT 14  ALKPHOS 71  BILITOT 0.3  PROT 7.4  ALBUMIN 3.3*     Recent Results (from the past 240 hour(s))  Resp Panel by RT-PCR (Flu A&B, Covid) Nasopharyngeal Swab     Status: None   Collection Time: 12/16/20  1:39 AM   Specimen: Nasopharyngeal Swab; Nasopharyngeal(NP) swabs in vial transport medium  Result Value Ref Range Status   SARS Coronavirus 2 by RT PCR NEGATIVE NEGATIVE Final    Comment: (NOTE) SARS-CoV-2 target nucleic acids are NOT DETECTED.  The SARS-CoV-2 RNA is generally detectable in upper respiratory  specimens during the acute phase of infection. The lowest concentration of SARS-CoV-2 viral copies this assay can detect is 138 copies/mL. A negative result does not preclude SARS-Cov-2 infection and should not be used as the sole basis for treatment or other patient management decisions. A negative result may occur with  improper specimen collection/handling, submission of specimen other than nasopharyngeal swab, presence of viral mutation(s) within the areas targeted by this assay, and  inadequate number of viral copies(<138 copies/mL). A negative result must be combined with clinical observations, patient history, and epidemiological information. The expected result is Negative.  Fact Sheet for Patients:  BloggerCourse.com  Fact Sheet for Healthcare Providers:  SeriousBroker.it  This test is no t yet approved or cleared by the [place] FDA and  has been authorized for detection and/or diagnosis of SARS-CoV-2 by FDA under an Emergency Use Authorization (EUA). This EUA will remain  in effect (meaning this test can be used) for the duration of the COVID-19 declaration under Section 564(b)(1) of the Act, 21 U.S.C.section 360bbb-3(b)(1), unless the authorization is terminated  or revoked sooner.       Influenza A by PCR NEGATIVE NEGATIVE Final   Influenza B by PCR NEGATIVE NEGATIVE Final    Comment: (NOTE) The Xpert Xpress SARS-CoV-2/FLU/RSV plus assay is intended as an aid in the diagnosis of influenza from Nasopharyngeal swab specimens and should not be used as a sole basis for treatment. Nasal washings and aspirates are unacceptable for Xpert Xpress SARS-CoV-2/FLU/RSV testing.  Fact Sheet for Patients: BloggerCourse.com  Fact Sheet for Healthcare Providers: SeriousBroker.it  This test is not yet approved or cleared by the [place] FDA and has been authorized for detection and/or diagnosis of SARS-CoV-2 by FDA under an Emergency Use Authorization (EUA). This EUA will remain in effect (meaning this test can be used) for the duration of the COVID-19 declaration under Section 564(b)(1) of the Act, 21 U.S.C. section 360bbb-3(b)(1), unless the authorization is terminated or revoked.  Performed at [facility], [address], [place], [place] [address]       Radiology Studies: DG Chest 2 View  Result Date: 12/15/2020 CLINICAL DATA:  Chest  pain EXAM: CHEST - 2 VIEW COMPARISON:  07/08/2020 FINDINGS: The heart size and mediastinal contours are within normal limits. Both lungs are clear. The visualized skeletal structures are unremarkable. IMPRESSION: No active cardiopulmonary disease. Electronically Signed   By: Jasmine Pang M.D.   On: 12/15/2020 20:55   CT Angio Chest PE W and/or Wo Contrast  Result Date: 12/16/2020 CLINICAL DATA:  Chest pain, evaluate for PE EXAM: CT ANGIOGRAPHY CHEST WITH CONTRAST TECHNIQUE: Multidetector CT imaging of the chest was performed using the standard protocol during bolus administration of intravenous contrast. Multiplanar CT image reconstructions and MIPs were obtained to evaluate the vascular anatomy. CONTRAST:  12mL OMNIPAQUE IOHEXOL 350 MG/ML SOLN COMPARISON:  Chest radiographs dated 12/15/2020 FINDINGS: Cardiovascular: Satisfactory opacification of the bilateral pulmonary arteries to the segmental level. Lobar pulmonary emboli in the right upper lobe (series 5/image 56) and right middle/lower lobe pulmonary arteries (series 5/image 70). Lobar/segmental left lower lobe pulmonary emboli (series 5/image 81). Additional segmental pulmonary emboli in the left upper lobe (series 5/image 46). Overall clot burden is moderate to large. Although not tailored for evaluation of the thoracic aorta, there is no evidence of thoracic aortic aneurysm or dissection. The heart is normal in size. No pericardial effusion. No evidence of right heart strain (RV to LV ratio 0.88). Mediastinum/Nodes: No suspicious mediastinal lymphadenopathy.  Visualized thyroid is unremarkable. Lungs/Pleura: Lungs are clear. No suspicious pulmonary nodules. No focal consolidation. No pleural effusion or pneumothorax. Upper Abdomen: Visualized upper abdomen is grossly unremarkable, noting vascular calcifications. Musculoskeletal: Visualized osseous structures are within normal limits. Review of the MIP images confirms the above findings. IMPRESSION:  Lobar/segmental pulmonary emboli in all lobes. Overall clot burden is moderate to large. No evidence of right heart strain. Critical Value/emergent results were called by telephone at the time of interpretation on 12/16/2020 at 2:10 am to provider Dr. Jacqulyn Bath, who verbally acknowledged these results. Electronically Signed   By: Charline Bills M.D.   On: 12/16/2020 02:13     Scheduled Meds:  aspirin EC  81 mg Oral Daily   losartan  100 mg Oral Daily   And   hydrochlorothiazide  25 mg Oral Daily   pantoprazole  20 mg Oral Daily   rosuvastatin  10 mg Oral QHS   Continuous Infusions:  heparin 1,400 Units/hr (12/16/20 0238)     LOS: 0 days    Reva Bores, MD 12/16/2020 1:03 PM [phone] [facility] If 7PM-7AM, please contact night-coverage 12/16/2020, 1:03 PM

## 2020-12-16 NOTE — H&P (Addendum)
History and Physical    Mindy Wade XBD:[id number] DOB: 1980-06-21 DOA: 12/15/2020  PCP: Medicine, [facility]  Patient coming from: Home   Chief Complaint:  Chief Complaint  Patient presents with   Chest Pain   Shortness of Breath     HPI:    40 year old female with past medical history of previous pulmonary embolism (2015, now off anticoagulation after 6 months of therapy), hypertension, hyperlipidemia, gastroesophageal reflux disease who presents to [facility] emergency department with complaints of shortness of breath and dyspnea on exertion.  Patient splinted earlier in the day on 10/20 patient began to develop sudden onset shortness of breath.  Shortness of breath is moderate to severe in intensity, worse with minimal exertion and improved with rest.  Patient denies any cough or fever.  Patient denies any sick contacts recent travel or contact with confirmed COVID-19 infection.  Patient denies any leg swelling paroxysmal nocturnal dyspnea or pillow orthopnea.  Due to progressively worsening symptoms the patient eventually presented to [facility] emergency department for evaluation.  Upon evaluation in the emergency department D-dimer was performed and found to be markedly elevated 11.2.  This prompted a CT angiogram of the chest which revealed segmental pulmonary emboli in all lobes with a moderate to large clot burden but no evidence of right heart strain.  ER provider initiated heparin infusion.  Hospitalist group has now been called to assess patient for admission the hospital.   Review of Systems:   Review of Systems  Respiratory:  Positive for shortness of breath.   All other systems reviewed and are negative.  Past Medical History:  Diagnosis Date   AMA (advanced maternal age) multigravida 35+    Breast feeding status of mother    GERD (gastroesophageal reflux disease)    Headache    Hx of blood clots    Hx of varicella     Hyperlipemia    Hypertension    takes meds   Pregnancy induced hypertension    Pulmonary embolism (HCC)     Past Surgical History:  Procedure Laterality Date   CHOLECYSTECTOMY       reports that she has never smoked. She has never used smokeless tobacco. She reports that she does not drink alcohol and does not use drugs.  Allergies  Allergen Reactions   Penicillins Anaphylaxis    Has patient had a PCN reaction causing immediate rash, facial/tongue/throat swelling, SOB or lightheadedness with hypotension: Yes Has patient had a PCN reaction causing severe rash involving mucus membranes or skin necrosis: No Has patient had a PCN reaction that required hospitalization No Has patient had a PCN reaction occurring within the last 10 years: No If all of the above answers are "NO", then may proceed with Cephalosporin use.     Family History  Problem Relation Age of Onset   Asthma Mother    Heart disease Mother    Hypertension Mother    Stroke Mother    Heart attack Mother 65   Cancer Father        prostate   Alcohol abuse Neg Hx    Arthritis Neg Hx    Birth defects Neg Hx    COPD Neg Hx    Depression Neg Hx    Diabetes Neg Hx    Drug abuse Neg Hx    Early death Neg Hx    Hearing loss Neg Hx    Hyperlipidemia Neg Hx    Kidney disease Neg Hx  Learning disabilities Neg Hx    Mental illness Neg Hx    Mental retardation Neg Hx    Miscarriages / Stillbirths Neg Hx    Vision loss Neg Hx    Varicose Veins Neg Hx      Prior to Admission medications   Medication Sig Start Date End Date Taking? Authorizing Provider  acetaminophen (TYLENOL) 500 MG tablet Take 1,000 mg by mouth every 6 (six) hours as needed for moderate pain, mild pain or headache.    [provider]  aspirin EC 81 MG tablet Take 81 mg by mouth daily.    [provider]  famotidine (PEPCID) 20 MG tablet Take 1 tablet (20 mg total) by mouth 2 (two) times daily for 5 days. Patient not taking:  No sig reported 11/14/19 11/19/19  Joy, Ines Bloomer C, PA-C  losartan-hydrochlorothiazide (HYZAAR) 100-25 MG tablet Take 1 tablet by mouth every evening. 06/26/20   [provider]  meclizine (ANTIVERT) 25 MG tablet Take 1 tablet (25 mg total) by mouth 3 (three) times daily as needed for dizziness or nausea. Patient not taking: No sig reported 05/21/19   Wieters, Hallie C, PA-C  metaxalone (SKELAXIN) 800 MG tablet Take 1 tablet (800 mg total) by mouth 3 (three) times daily. Patient not taking: No sig reported 07/17/19   Lorre Nick, MD  metoprolol tartrate (LOPRESSOR) 25 MG tablet Take 25 mg by mouth 2 (two) times daily as needed (palpitations).    [provider]  Multiple Vitamin (MULTIVITAMIN WITH MINERALS) TABS tablet Take 1 tablet by mouth daily.    [provider]  ondansetron (ZOFRAN ODT) 4 MG disintegrating tablet Take 1 tablet (4 mg total) by mouth every 8 (eight) hours as needed for nausea or vomiting. Patient not taking: No sig reported 05/23/19   Gailen Shelter, PA  pantoprazole (PROTONIX) 20 MG tablet Take 1 tablet (20 mg total) by mouth daily. Patient not taking: Reported on 07/08/2020 11/14/19 01/13/20  Anselm Pancoast, PA-C  paragard intrauterine copper IUD IUD by Intrauterine route continuous. Since 01/2019.    [provider]  rosuvastatin (CRESTOR) 10 MG tablet Take 10 mg by mouth at bedtime. 01/18/20   [provider]  enoxaparin (LOVENOX) 150 MG/ML injection Inject 0.73 mLs (110 mg total) into the skin daily. Patient not taking: Reported on 01/14/2019 09/22/18 01/14/19  Antony Madura, PA-C  omeprazole (PRILOSEC) 20 MG capsule Take 1 capsule (20 mg total) by mouth daily. Patient not taking: Reported on 09/22/2018 07/05/18 01/14/19  Renne Crigler, PA-C    Physical Exam: Vitals:   12/16/20 0500 12/16/20 0632 12/16/20 0700 12/16/20 0800  BP: 117/75 108/66 102/72 103/71  Pulse: 67 69 64 70  Resp: 17 19 17 16   Temp:      TempSrc:      SpO2: 97%  97% 96% 96%  Weight:      Height:        Constitutional: Awake alert and oriented x3, no associated distress.   Skin: no rashes, no lesions, good skin turgor noted. Eyes: Pupils are equally reactive to light.  No evidence of scleral icterus or conjunctival pallor.  ENMT: Moist mucous membranes noted.  Posterior pharynx clear of any exudate or lesions.   Neck: normal, supple, no masses, no thyromegaly.  No evidence of jugular venous distension.   Respiratory: clear to auscultation bilaterally, no wheezing, no crackles. Normal respiratory effort. No accessory muscle use.  Cardiovascular: Regular rate and rhythm, no murmurs / rubs / gallops. No extremity  edema. 2+ pedal pulses. No carotid bruits.  Chest:   Nontender without crepitus or deformity.   Back:   Nontender without crepitus or deformity. Abdomen: Abdomen is soft and nontender.  No evidence of intra-abdominal masses.  Positive bowel sounds noted in all quadrants.   Musculoskeletal: No joint deformity upper and lower extremities. Good ROM, no contractures. Normal muscle tone.  Neurologic: CN 2-12 grossly intact. Sensation intact.  Patient moving all 4 extremities spontaneously.  Patient is following all commands.  Patient is responsive to verbal stimuli.   Psychiatric: Patient exhibits normal mood with appropriate affect.  Patient seems to possess insight as to their current situation.     Labs on Admission: I have personally reviewed following labs and imaging studies -   CBC: Recent Labs  Lab 12/15/20 2029 12/16/20 0534  WBC 6.1 7.7  NEUTROABS 3.6 4.0  HGB 12.8 12.2  HCT 39.0 37.8  MCV 91.8 92.9  PLT 267 250   Basic Metabolic Panel: Recent Labs  Lab 12/15/20 2029  NA 139  K 3.7  CL 107  CO2 25  GLUCOSE 93  BUN 15  CREATININE 0.95  CALCIUM 9.0   GFR: Estimated Creatinine Clearance: 108.6 mL/min (by C-G formula based on SCr of 0.95 mg/dL). Liver Function Tests: Recent Labs  Lab 12/15/20 2029  AST 18  ALT 14   ALKPHOS 71  BILITOT 0.3  PROT 7.4  ALBUMIN 3.3*   No results for input(s): LIPASE, AMYLASE in the last 168 hours. No results for input(s): AMMONIA in the last 168 hours. Coagulation Profile: No results for input(s): INR, PROTIME in the last 168 hours. Cardiac Enzymes: No results for input(s): CKTOTAL, CKMB, CKMBINDEX, TROPONINI in the last 168 hours. BNP (last 3 results) No results for input(s): PROBNP in the last 8760 hours. HbA1C: No results for input(s): HGBA1C in the last 72 hours. CBG: No results for input(s): GLUCAP in the last 168 hours. Lipid Profile: No results for input(s): CHOL, HDL, LDLCALC, TRIG, CHOLHDL, LDLDIRECT in the last 72 hours. Thyroid Function Tests: No results for input(s): TSH, T4TOTAL, FREET4, T3FREE, THYROIDAB in the last 72 hours. Anemia Panel: No results for input(s): VITAMINB12, FOLATE, FERRITIN, TIBC, IRON, RETICCTPCT in the last 72 hours. Urine analysis:    Component Value Date/Time   COLORURINE STRAW (A) 01/14/2019 0400   APPEARANCEUR CLEAR 01/14/2019 0400   LABSPEC 1.009 01/14/2019 0400   PHURINE 6.0 01/14/2019 0400   GLUCOSEU NEGATIVE 01/14/2019 0400   HGBUR SMALL (A) 01/14/2019 0400   BILIRUBINUR NEGATIVE 01/14/2019 0400   KETONESUR NEGATIVE 01/14/2019 0400   PROTEINUR NEGATIVE 01/14/2019 0400   UROBILINOGEN 0.2 10/09/2013 0715   NITRITE NEGATIVE 01/14/2019 0400   LEUKOCYTESUR NEGATIVE 01/14/2019 0400    Radiological Exams on Admission - Personally Reviewed: DG Chest 2 View  Result Date: 12/15/2020 CLINICAL DATA:  Chest pain EXAM: CHEST - 2 VIEW COMPARISON:  07/08/2020 FINDINGS: The heart size and mediastinal contours are within normal limits. Both lungs are clear. The visualized skeletal structures are unremarkable. IMPRESSION: No active cardiopulmonary disease. Electronically Signed   By: Jasmine Pang M.D.   On: 12/15/2020 20:55   CT Angio Chest PE W and/or Wo Contrast  Result Date: 12/16/2020 CLINICAL DATA:  Chest pain,  evaluate for PE EXAM: CT ANGIOGRAPHY CHEST WITH CONTRAST TECHNIQUE: Multidetector CT imaging of the chest was performed using the standard protocol during bolus administration of intravenous contrast. Multiplanar CT image reconstructions and MIPs were obtained to evaluate the vascular anatomy. CONTRAST:  79mL OMNIPAQUE  IOHEXOL 350 MG/ML SOLN COMPARISON:  Chest radiographs dated 12/15/2020 FINDINGS: Cardiovascular: Satisfactory opacification of the bilateral pulmonary arteries to the segmental level. Lobar pulmonary emboli in the right upper lobe (series 5/image 56) and right middle/lower lobe pulmonary arteries (series 5/image 70). Lobar/segmental left lower lobe pulmonary emboli (series 5/image 81). Additional segmental pulmonary emboli in the left upper lobe (series 5/image 46). Overall clot burden is moderate to large. Although not tailored for evaluation of the thoracic aorta, there is no evidence of thoracic aortic aneurysm or dissection. The heart is normal in size. No pericardial effusion. No evidence of right heart strain (RV to LV ratio 0.88). Mediastinum/Nodes: No suspicious mediastinal lymphadenopathy. Visualized thyroid is unremarkable. Lungs/Pleura: Lungs are clear. No suspicious pulmonary nodules. No focal consolidation. No pleural effusion or pneumothorax. Upper Abdomen: Visualized upper abdomen is grossly unremarkable, noting vascular calcifications. Musculoskeletal: Visualized osseous structures are within normal limits. Review of the MIP images confirms the above findings. IMPRESSION: Lobar/segmental pulmonary emboli in all lobes. Overall clot burden is moderate to large. No evidence of right heart strain. Critical Value/emergent results were called by telephone at the time of interpretation on 12/16/2020 at 2:10 am to provider Dr. Jacqulyn Bath, who verbally acknowledged these results. Electronically Signed   By: Charline Bills M.D.   On: 12/16/2020 02:13    EKG: Personally reviewed.  Rhythm is normal  sinus rhythm with heart rate of 98 bpm.  No dynamic ST segment changes appreciated.  Assessment/Plan  Acute pulmonary embolism without acute cor pulmonale (HCC) CT angiogram reveals evidence of Lobar/segmental pulmonary emboli in all lobes with overall moderate to large clot burden.  No evidence of right heart strain on CT imaging Etiology is unclear.  Patient denies any long car trips, unexplained weight loss, night sweats or recent procedures. Patient has been placed on Heparin infusion by ER provider which will be continued. Echocardiogram will be obtained in the morning and once it is confirmed that there is no significant right heart strain patient can likely be transitioned back to an oral anticoagulant Monitoring patient on telemetry Minimally elevated cardiac enzymes in the absence of chest pain.  Continuing to monitor. Supplemental oxygen for bouts of hypoxia As needed opiate-based analgesics for associated chest pain   Elevated troponin level not due myocardial infarction Minimal elevation in cardiac enzymes No evidence of chest pain being blocked rupture unlikely Likely secondary to supply demand mismatch due to ongoing pulmonary embolism Will continue to trend as long as uptrending Monitoring patient on telemetry  Essential hypertension Resume patients home regimen of oral antihypertensives Titrate antihypertensive regimen as necessary to achieve adequate BP control PRN intravenous antihypertensives for excessively elevated blood pressure    Mixed hyperlipidemia Continuing home regimen of lipid lowering therapy.   GERD without esophagitis Continuing home regimen of daily PPI therapy.      Code Status:  Full code  code status decision has been confirmed with: Patient Family Communication: Significant other at bedside who has been updated on plan of care.  Status is: Observation  The patient remains OBS appropriate and will d/c before 2 midnights.        Marinda Elk MD [facility] Pager [phone]  If 7PM-7AM, please contact night-coverage [email or web address] Use universal [place] password for that web site. If you do not have the password, please call the hospital operator.  12/16/2020, 8:47 AM

## 2020-12-16 NOTE — Plan of Care (Signed)

## 2020-12-16 NOTE — Assessment & Plan Note (Addendum)
   CT angiogram reveals evidence of Lobar/segmental pulmonary emboli in all lobes with overall moderate to large clot burden.   No evidence of right heart strain on CT imaging  Etiology is unclear.  Patient denies any long car trips, unexplained weight loss, night sweats or recent procedures.  Patient has been placed on Heparin infusion by ER provider which will be continued.  Echocardiogram will be obtained in the morning and once it is confirmed that there is no significant right heart strain patient can likely be transitioned back to an oral anticoagulant  Monitoring patient on telemetry  Minimally elevated cardiac enzymes in the absence of chest pain.  Continuing to monitor.  Supplemental oxygen for bouts of hypoxia  As needed opiate-based analgesics for associated chest pain

## 2020-12-16 NOTE — Assessment & Plan Note (Signed)
.   Resume patients home regimen of oral antihypertensives . Titrate antihypertensive regimen as necessary to achieve adequate BP control . PRN intravenous antihypertensives for excessively elevated blood pressure   

## 2020-12-16 NOTE — Progress Notes (Signed)
ANTICOAGULATION CONSULT NOTE - Follow Up Consult  Pharmacy Consult for Heparin  Indication: pulmonary embolus (new onset)  Allergies  Allergen Reactions   Penicillins Anaphylaxis    Has patient had a PCN reaction causing immediate rash, facial/tongue/throat swelling, SOB or lightheadedness with hypotension: Yes Has patient had a PCN reaction causing severe rash involving mucus membranes or skin necrosis: No Has patient had a PCN reaction that required hospitalization No Has patient had a PCN reaction occurring within the last 10 years: No If all of the above answers are "NO", then may proceed with Cephalosporin use.     Patient Measurements: Height: 5\' 10"  (177.8 cm) Weight: 117.9 kg (260 lb) IBW/kg (Calculated) : 68.5  Vital Signs: Temp: 98.4 F (36.9 C) (10/21 1540) Temp Source: Oral (10/21 1540) BP: 146/99 (10/21 1540) Pulse Rate: 88 (10/21 1540)  Labs: Recent Labs    12/15/20 2029 12/15/20 2246 12/16/20 0534 12/16/20 0852 12/16/20 1045 12/16/20 1642  HGB 12.8  --  12.2  --   --   --   HCT 39.0  --  37.8  --   --   --   PLT 267  --  250  --   --   --   HEPARINUNFRC  --   --   --   --  0.51 0.47  CREATININE 0.95  --   --   --   --   --   TROPONINIHS 9 23*  --  12  --   --      Estimated Creatinine Clearance: 108.6 mL/min (by C-G formula based on SCr of 0.95 mg/dL).   Medical History: Past Medical History:  Diagnosis Date   AMA (advanced maternal age) multigravida 35+    Breast feeding status of mother    GERD (gastroesophageal reflux disease)    Headache    Hx of blood clots    Hx of varicella    Hyperlipemia    Hypertension    takes meds   Pregnancy induced hypertension    Pulmonary embolism (HCC)     Assessment: 40 y/o F with acute onset dyspnea found to have new onset PE per CT Angio. Pt has history of PE several years ago but is no longer on anti-coagulation.   1st HL 0.51 - therapeutic, CBC wnl this AM. IV site ok. 2nd heparin level  therapeutic   Goal of Therapy:  Heparin level 0.3-0.7 units/ml Monitor platelets by anticoagulation protocol: Yes   Plan:  Continue heparin drip at 1400 units/hr Daily CBC/Heparin level Monitor for bleeding  Thank you 46, PharmD

## 2020-12-16 NOTE — ED Notes (Signed)
Lunch ordered 

## 2020-12-16 NOTE — Progress Notes (Signed)
New admission to room 2W06. Patient is A&O*4. Able to verbalize her needs to staff. No CO pain or discomfort at this time. Patient is on heparin gtt at 19ml/hr. No skin issues noted. Cardiac (tele) monitor applied and CCMD notified. Bed kept in low position and locked. Call bell in reach. No further needs expressed at this time.

## 2020-12-16 NOTE — Progress Notes (Signed)
ANTICOAGULATION CONSULT NOTE - Follow Up Consult  Pharmacy Consult for Heparin  Indication: pulmonary embolus (new onset)  Allergies  Allergen Reactions   Penicillins Anaphylaxis    Has patient had a PCN reaction causing immediate rash, facial/tongue/throat swelling, SOB or lightheadedness with hypotension: Yes Has patient had a PCN reaction causing severe rash involving mucus membranes or skin necrosis: No Has patient had a PCN reaction that required hospitalization No Has patient had a PCN reaction occurring within the last 10 years: No If all of the above answers are "NO", then may proceed with Cephalosporin use.     Patient Measurements: Height: 5\' 10"  (177.8 cm) Weight: 117.9 kg (260 lb) IBW/kg (Calculated) : 68.5  Vital Signs: BP: 94/68 (10/21 1100) Pulse Rate: 66 (10/21 1100)  Labs: Recent Labs    12/15/20 2029 12/15/20 2246 12/16/20 0534 12/16/20 0852 12/16/20 1045  HGB 12.8  --  12.2  --   --   HCT 39.0  --  37.8  --   --   PLT 267  --  250  --   --   HEPARINUNFRC  --   --   --   --  0.51  CREATININE 0.95  --   --   --   --   TROPONINIHS 9 23*  --  12  --      Estimated Creatinine Clearance: 108.6 mL/min (by C-G formula based on SCr of 0.95 mg/dL).   Medical History: Past Medical History:  Diagnosis Date   AMA (advanced maternal age) multigravida 35+    Breast feeding status of mother    GERD (gastroesophageal reflux disease)    Headache    Hx of blood clots    Hx of varicella    Hyperlipemia    Hypertension    takes meds   Pregnancy induced hypertension    Pulmonary embolism (HCC)     Assessment: 40 y/o F with acute onset dyspnea found to have new onset PE per CT Angio. Pt has history of PE several years ago but is no longer on anti-coagulation.   1st HL 0.51 - therapeutic, CBC wnl this AM. IV site ok.  Goal of Therapy:  Heparin level 0.3-0.7 units/ml Monitor platelets by anticoagulation protocol: Yes   Plan: Continue heparin drip at 1400  units/hr 1700 confirmatory heparin level tonight Daily CBC/Heparin level Monitor for bleeding  46, PharmD, [facility] Emergency Medicine Clinical Pharmacist ED RPh Phone: [phone] Main RX: [phone]

## 2020-12-16 NOTE — Progress Notes (Signed)
ANTICOAGULATION CONSULT NOTE - Initial Consult  Pharmacy Consult for Heparin  Indication: pulmonary embolus (new onset)  Allergies  Allergen Reactions   Penicillins Anaphylaxis    Has patient had a PCN reaction causing immediate rash, facial/tongue/throat swelling, SOB or lightheadedness with hypotension: Yes Has patient had a PCN reaction causing severe rash involving mucus membranes or skin necrosis: No Has patient had a PCN reaction that required hospitalization No Has patient had a PCN reaction occurring within the last 10 years: No If all of the above answers are "NO", then may proceed with Cephalosporin use.     Patient Measurements: Height: 5\' 10"  (177.8 cm) Weight: 117.9 kg (260 lb) IBW/kg (Calculated) : 68.5  Vital Signs: Temp: 98.5 F (36.9 C) (10/20 1957) Temp Source: Oral (10/20 1957) BP: 115/87 (10/21 0130) Pulse Rate: 78 (10/21 0130)  Labs: Recent Labs    12/15/20 2029 12/15/20 2246  HGB 12.8  --   HCT 39.0  --   PLT 267  --   CREATININE 0.95  --   TROPONINIHS 9 23*    Estimated Creatinine Clearance: 108.6 mL/min (by C-G formula based on SCr of 0.95 mg/dL).   Medical History: Past Medical History:  Diagnosis Date   AMA (advanced maternal age) multigravida 35+    Breast feeding status of mother    GERD (gastroesophageal reflux disease)    Headache    Hx of blood clots    Hx of varicella    Hyperlipemia    Hypertension    takes meds   Pregnancy induced hypertension    Pulmonary embolism (HCC)     Assessment: 40 y/o F with acute onset dyspnea found to have new onset PE per CT Angio. Pt has history of PE several years ago but is no longer on anti-coagulation. Starting heparin. CBC/renal function good.   Goal of Therapy:  Heparin level 0.3-0.7 units/ml Monitor platelets by anticoagulation protocol: Yes   Plan:  Heparin 6000 units BOLUS Start heparin drip at 1400 units/hr 1000 Heparin level Daily CBC/Heparin level Monitor for  bleeding  46, PharmD, BCPS Clinical Pharmacist Phone: [phone]

## 2020-12-16 NOTE — ED Notes (Signed)
Patient transported to CT 

## 2020-12-16 NOTE — ED Notes (Addendum)
Pt ambulated to the bathroom on her own power, gait even and steady.She did report some shortness of breath but O2 remained WNL.

## 2020-12-16 NOTE — Assessment & Plan Note (Signed)
.   Continuing home regimen of lipid lowering therapy.  

## 2020-12-17 ENCOUNTER — Observation Stay (HOSPITAL_BASED_OUTPATIENT_CLINIC_OR_DEPARTMENT_OTHER): Payer: 59

## 2020-12-17 DIAGNOSIS — I2693 Single subsegmental pulmonary embolism without acute cor pulmonale: Secondary | ICD-10-CM

## 2020-12-17 DIAGNOSIS — I2699 Other pulmonary embolism without acute cor pulmonale: Secondary | ICD-10-CM

## 2020-12-17 LAB — ECHOCARDIOGRAM COMPLETE
AR max vel: 2.41 cm2
AV Area VTI: 2.14 cm2
AV Area mean vel: 2.2 cm2
AV Mean grad: 3.7 mmHg
AV Peak grad: 6.8 mmHg
Ao pk vel: 1.3 m/s
Area-P 1/2: 3.6 cm2
Calc EF: 54 %
Height: 70 in
S' Lateral: 3.8 cm
Single Plane A2C EF: 60.6 %
Single Plane A4C EF: 46.7 %
Weight: 4160 oz

## 2020-12-17 LAB — COMPREHENSIVE METABOLIC PANEL
ALT: 12 U/L (ref 0–44)
AST: 23 U/L (ref 15–41)
Albumin: 2.9 g/dL — ABNORMAL LOW (ref 3.5–5.0)
Alkaline Phosphatase: 68 U/L (ref 38–126)
Anion gap: 5 (ref 5–15)
BUN: 12 mg/dL (ref 6–20)
CO2: 26 mmol/L (ref 22–32)
Calcium: 8.6 mg/dL — ABNORMAL LOW (ref 8.9–10.3)
Chloride: 105 mmol/L (ref 98–111)
Creatinine, Ser: 0.69 mg/dL (ref 0.44–1.00)
GFR, Estimated: >60 mL/min (ref >60)
Glucose, Bld: 117 mg/dL — ABNORMAL HIGH (ref 70–99)
Potassium: 3.6 mmol/L (ref 3.5–5.1)
Sodium: 136 mmol/L (ref 135–145)
Total Bilirubin: 0.2 mg/dL — ABNORMAL LOW (ref 0.3–1.2)
Total Protein: 6.7 g/dL (ref 6.5–8.1)

## 2020-12-17 LAB — CBC
HCT: 35.7 % — ABNORMAL LOW (ref 36.0–46.0)
Hemoglobin: 11.8 g/dL — ABNORMAL LOW (ref 12.0–15.0)
MCH: 30.2 pg (ref 26.0–34.0)
MCHC: 33.1 g/dL (ref 30.0–36.0)
MCV: 91.3 fL (ref 80.0–100.0)
Platelets: 252 10*3/uL (ref 150–400)
RBC: 3.91 MIL/uL (ref 3.87–5.11)
RDW: 14.3 % (ref 11.5–15.5)
WBC: 7 10*3/uL (ref 4.0–10.5)
nRBC: 0 % (ref 0.0–0.2)

## 2020-12-17 LAB — HEPARIN LEVEL (UNFRACTIONATED): Heparin Unfractionated: 0.34 IU/mL (ref 0.30–0.70)

## 2020-12-17 LAB — MAGNESIUM: Magnesium: 1.7 mg/dL (ref 1.7–2.4)

## 2020-12-17 MED ORDER — RIVAROXABAN 15 MG PO TABS
15.0000 mg | ORAL_TABLET | Freq: Two times a day (BID) | ORAL | Status: DC
  Administered 2020-12-17: 15 mg via ORAL
  Filled 2020-12-17: qty 1

## 2020-12-17 MED ORDER — MAGNESIUM SULFATE 2 GM/50ML IV SOLN
2.0000 g | Freq: Once | INTRAVENOUS | Status: AC
  Administered 2020-12-17: 2 g via INTRAVENOUS
  Filled 2020-12-17: qty 50

## 2020-12-17 MED ORDER — TRAMADOL HCL 50 MG PO TABS: 50.0000 mg | ORAL_TABLET | Freq: Four times a day (QID) | ORAL | Status: DC | PRN

## 2020-12-17 MED ORDER — RIVAROXABAN 20 MG PO TABS: 20.0000 mg | ORAL_TABLET | Freq: Every day | ORAL | Status: DC

## 2020-12-17 MED ORDER — LOSARTAN POTASSIUM-HCTZ 100-25 MG PO TABS: 1.0000 | ORAL_TABLET | Freq: Every day | ORAL | Status: AC

## 2020-12-17 MED ORDER — RIVAROXABAN (XARELTO) VTE STARTER PACK (15 & 20 MG): ORAL_TABLET | ORAL | 0 refills | Status: DC

## 2020-12-17 MED ORDER — RIVAROXABAN (XARELTO) VTE STARTER PACK (15 & 20 MG): ORAL_TABLET | ORAL | 0 refills | Status: AC

## 2020-12-17 NOTE — Care Management (Signed)
Provided with 30 day free and $10 Xaralto copay cards. 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

## 2020-12-17 NOTE — Plan of Care (Signed)
Patient discharged to go home today,

## 2020-12-17 NOTE — Progress Notes (Signed)
Lower extremity venous has been completed.   Preliminary results in CV Proc.   Aundra Millet Amand Lemoine 12/17/2020 11:44 AM

## 2020-12-17 NOTE — Progress Notes (Signed)
Nsg Discharge Note  Admit Date:  12/15/2020 Discharge date: 12/17/2020   Fallon Haecker to be D/C'd Home per MD order.  AVS completed.  C Patient/caregiver able to verbalize understanding.  Discharge Medication: Allergies as of 12/17/2020       Reactions   Penicillins Anaphylaxis   Has patient had a PCN reaction causing immediate rash, facial/tongue/throat swelling, SOB or lightheadedness with hypotension: Yes Has patient had a PCN reaction causing severe rash involving mucus membranes or skin necrosis: No Has patient had a PCN reaction that required hospitalization No Has patient had a PCN reaction occurring within the last 10 years: No If all of the above answers are "NO", then may proceed with Cephalosporin use.        Medication List     STOP taking these medications    aspirin EC 81 MG tablet   famotidine 20 MG tablet Commonly known as: PEPCID   meclizine 25 MG tablet Commonly known as: ANTIVERT   metaxalone 800 MG tablet Commonly known as: SKELAXIN   metoprolol tartrate 25 MG tablet Commonly known as: LOPRESSOR   ondansetron 4 MG disintegrating tablet Commonly known as: Zofran ODT   pantoprazole 20 MG tablet Commonly known as: PROTONIX       TAKE these medications    acetaminophen 500 MG tablet Commonly known as: TYLENOL Take 1,000 mg by mouth every 6 (six) hours as needed for moderate pain, mild pain or headache.   losartan-hydrochlorothiazide 100-25 MG tablet Commonly known as: HYZAAR Take 1 tablet by mouth daily. Start taking on: December 19, 2020 What changed: These instructions start on December 19, 2020. If you are unsure what to do until then, ask your doctor or other care provider.   multivitamin with minerals Tabs tablet Take 1 tablet by mouth daily.   paragard intrauterine copper Iud IUD by Intrauterine route continuous. Since 01/2019.   Rivaroxaban Stater Pack (15 mg and 20 mg) Commonly known as: XARELTO STARTER PACK Follow  package directions: Take one 15mg  tablet by mouth twice a day. On day 22, switch to one 20mg  tablet once a day. Take with food.   rosuvastatin 10 MG tablet Commonly known as: CRESTOR Take 10 mg by mouth at bedtime.        Discharge Assessment: Vitals:   12/17/20 1036 12/17/20 1229  BP: (!) 112/92 115/78  Pulse: 64 73  Resp:    Temp:  97.9 F (36.6 C)  SpO2: 99% 99%   Skin clean, dry and intact without evidence of skin break down, no evidence of skin tears noted. IV catheter discontinued intact. Site without signs and symptoms of complications - no redness or edema noted at insertion site, patient denies c/o pain - only slight tenderness at site.  Dressing with slight pressure applied.  D/c Instructions-Education: Discharge instructions given to patient/family with verbalized understanding. D/c education completed with patient/family including follow up instructions, medication list, d/c activities limitations if indicated, with other d/c instructions as indicated by MD - patient able to verbalize understanding, all questions fully answered. Patient instructed to return to ED, call 911, or call MD for any changes in condition.  Patient escorted via WC, and D/C home via private auto.  Jacquees Gongora, 12/19/20, RN 12/17/2020 4:57 PM

## 2020-12-17 NOTE — Progress Notes (Signed)
  Echocardiogram 2D Echocardiogram has been performed.  Delcie Roch 12/17/2020, 9:46 AM

## 2020-12-17 NOTE — Progress Notes (Signed)
SATURATION QUALIFICATIONS: (This note is used to comply with regulatory documentation for home oxygen)  Patient Saturations on Room Air at Rest = 99%  Patient Saturations on Room Air while Ambulating = 100%  HR Max 104 while ambulating

## 2020-12-17 NOTE — Progress Notes (Signed)
ANTICOAGULATION CONSULT NOTE - Follow Up Consult  Pharmacy Consult for Heparin  Indication: pulmonary embolus (new onset)  Allergies  Allergen Reactions   Penicillins Anaphylaxis    Has patient had a PCN reaction causing immediate rash, facial/tongue/throat swelling, SOB or lightheadedness with hypotension: Yes Has patient had a PCN reaction causing severe rash involving mucus membranes or skin necrosis: No Has patient had a PCN reaction that required hospitalization No Has patient had a PCN reaction occurring within the last 10 years: No If all of the above answers are "NO", then may proceed with Cephalosporin use.     Patient Measurements: Height: 5\' 10"  (177.8 cm) Weight: 117.9 kg (260 lb) IBW/kg (Calculated) : 68.5 Heparin dosing weight: 95.3 kg  Vital Signs: Temp: 97.6 F (36.4 C) (10/22 0527) Temp Source: Oral (10/22 0527) BP: 112/92 (10/22 1036) Pulse Rate: 64 (10/22 1036)  Labs: Recent Labs    12/15/20 2029 12/15/20 2246 12/16/20 0534 12/16/20 0852 12/16/20 1045 12/16/20 1642 12/17/20 0100  HGB 12.8  --  12.2  --   --   --  11.8*  HCT 39.0  --  37.8  --   --   --  35.7*  PLT 267  --  250  --   --   --  252  HEPARINUNFRC  --   --   --   --  0.51 0.47 0.34  CREATININE 0.95  --   --   --   --   --  0.69  TROPONINIHS 9 23*  --  12  --   --   --      Estimated Creatinine Clearance: 129 mL/min (by C-G formula based on SCr of 0.69 mg/dL).   Medical History: Past Medical History:  Diagnosis Date   AMA (advanced maternal age) multigravida 35+    Breast feeding status of mother    GERD (gastroesophageal reflux disease)    Headache    Hx of blood clots    Hx of varicella    Hyperlipemia    Hypertension    takes meds   Pregnancy induced hypertension    Pulmonary embolism (HCC)     Assessment: 40 y/o F with acute onset dyspnea found to have new onset PE per CT Angio. Pt has history of PE several years ago but is no longer on anti-coagulation.   Heparin  level therapeutic at 0.34, CBC wnl this AM. Due to PE burden, increase rate to be more within range.  Goal of Therapy:  Heparin level 0.3-0.7 units/ml Monitor platelets by anticoagulation protocol: Yes   Plan:  Increase heparin drip to 1450 units/hr Daily CBC/Heparin level Monitor for bleeding   45, PharmD PGY1 Pharmacy Resident  Please check AMION for all [facility] pharmacy phone numbers After 10:00 PM call main pharmacy [phone]

## 2020-12-17 NOTE — Progress Notes (Signed)
ANTICOAGULATION CONSULT NOTE - Initial Consult  Pharmacy Consult for Xarelto  Indication: pulmonary embolus  Allergies  Allergen Reactions   Penicillins Anaphylaxis    Has patient had a PCN reaction causing immediate rash, facial/tongue/throat swelling, SOB or lightheadedness with hypotension: Yes Has patient had a PCN reaction causing severe rash involving mucus membranes or skin necrosis: No Has patient had a PCN reaction that required hospitalization No Has patient had a PCN reaction occurring within the last 10 years: No If all of the above answers are "NO", then may proceed with Cephalosporin use.     Patient Measurements: Height: 5\' 10"  (177.8 cm) Weight: 117.9 kg (260 lb) IBW/kg (Calculated) : 68.5  Vital Signs: Temp: 97.9 F (36.6 C) (10/22 1229) Temp Source: Oral (10/22 0527) BP: 115/78 (10/22 1229) Pulse Rate: 73 (10/22 1229)  Labs: Recent Labs    12/15/20 2029 12/15/20 2246 12/16/20 0534 12/16/20 0852 12/16/20 1045 12/16/20 1642 12/17/20 0100  HGB 12.8  --  12.2  --   --   --  11.8*  HCT 39.0  --  37.8  --   --   --  35.7*  PLT 267  --  250  --   --   --  252  HEPARINUNFRC  --   --   --   --  0.51 0.47 0.34  CREATININE 0.95  --   --   --   --   --  0.69  TROPONINIHS 9 23*  --  12  --   --   --     Estimated Creatinine Clearance: 129 mL/min (by C-G formula based on SCr of 0.69 mg/dL).   Medical History: Past Medical History:  Diagnosis Date   AMA (advanced maternal age) multigravida 35+    Breast feeding status of mother    GERD (gastroesophageal reflux disease)    Headache    Hx of blood clots    Hx of varicella    Hyperlipemia    Hypertension    takes meds   Pregnancy induced hypertension    Pulmonary embolism (HCC)     Assessment: Anticoag: new PE, moderate to large clot burden, no RHS. No AC PTA currently (h/o apix).IV heparin>>Xarelto on 10/22. No DVTs on 11/22. - Hgb 12.2>11.8. Plts WNL, CrCl>100  Goal of Therapy:  Therapeutic oral  anticoagulation  Plan:  D/c IV heparin with first dose of Xarelto Xarelto 15mg  BID x 21d, then 20mg  daily   Avyan Livesay S. [place], PharmD, BCPS Clinical Staff Pharmacist [email or web address] , 12/17/2020,1:57 PM

## 2020-12-17 NOTE — Discharge Instructions (Signed)
Information on my medicine - XARELTO (rivaroxaban)  WHY WAS XARELTO PRESCRIBED FOR YOU? Xarelto was prescribed to treat blood clots that may have been found in the veins of your legs (deep vein thrombosis) or in your lungs (pulmonary embolism) and to reduce the risk of them occurring again.  What do you need to know about Xarelto? The starting dose is one 15 mg tablet taken TWICE daily with food for the FIRST 21 DAYS then on (enter date)  01/07/2021 PM  the dose is changed to one 20 mg tablet taken ONCE A DAY with your evening meal.  DO NOT stop taking Xarelto without talking to the health care provider who prescribed the medication.  Refill your prescription for 20 mg tablets before you run out.  After discharge, you should have regular check-up appointments with your healthcare provider that is prescribing your Xarelto.  In the future your dose may need to be changed if your kidney function changes by a significant amount.  What do you do if you miss a dose? If you are taking Xarelto TWICE DAILY and you miss a dose, take it as soon as you remember. You may take two 15 mg tablets (total 30 mg) at the same time then resume your regularly scheduled 15 mg twice daily the next day.  If you are taking Xarelto ONCE DAILY and you miss a dose, take it as soon as you remember on the same day then continue your regularly scheduled once daily regimen the next day. Do not take two doses of Xarelto at the same time.   Important Safety Information Xarelto is a blood thinner medicine that can cause bleeding. You should call your healthcare provider right away if you experience any of the following: Bleeding from an injury or your nose that does not stop. Unusual colored urine (red or dark brown) or unusual colored stools (red or black). Unusual bruising for unknown reasons. A serious fall or if you hit your head (even if there is no bleeding).  Some medicines may interact with Xarelto and might  increase your risk of bleeding while on Xarelto. To help avoid this, consult your healthcare provider or pharmacist prior to using any new prescription or non-prescription medications, including herbals, vitamins, non-steroidal anti-inflammatory drugs (NSAIDs) and supplements.  This website has more information on Xarelto: VisitDestination.com.br.

## 2020-12-18 NOTE — Discharge Summary (Addendum)
Physician Discharge Summary  Mamta Rimmer TDV:[id number] DOB: December 20, 1980 DOA: 12/15/2020  PCP: [facility], [facility]  Admit date: 12/15/2020 Discharge date: 12/17/2020  Admitted From: home Discharge disposition: home   Recommendations for Outpatient Follow-Up:   CBC 1 week Most likely need lifelong anticoagulation- has been seen by hematology in the past   Discharge Diagnosis:   Active Problems:   Acute pulmonary embolism without acute cor pulmonale (HCC)   Essential hypertension   Mixed hyperlipidemia   GERD without esophagitis   Elevated troponin level not due myocardial infarction    Discharge Condition: Improved.  Diet recommendation: Low sodium, heart healthy  Wound care: None.  Code status: Full.   History of Present Illness:   40 year old female with past medical history of previous pulmonary embolism (2015, now off anticoagulation after 6 months of therapy), hypertension, hyperlipidemia, gastroesophageal reflux disease who presents to [facility] emergency department with complaints of shortness of breath and dyspnea on exertion.   Patient splinted earlier in the day on 10/20 patient began to develop sudden onset shortness of breath.  Shortness of breath is moderate to severe in intensity, worse with minimal exertion and improved with rest.  Patient denies any cough or fever.  Patient denies any sick contacts recent travel or contact with confirmed COVID-19 infection.  Patient denies any leg swelling paroxysmal nocturnal dyspnea or pillow orthopnea.   Due to progressively worsening symptoms the patient eventually presented to [facility] emergency department for evaluation.   Upon evaluation in the emergency department D-dimer was performed and found to be markedly elevated 11.2.  This prompted a CT angiogram of the chest which revealed segmental pulmonary emboli in all lobes with a moderate to large clot burden  but no evidence of right heart strain.  ER provider initiated heparin infusion.  Hospitalist group has now been called to assess patient for admission the hospital.   Hospital Course by Problem:   Acute pulmonary embolism without cor pulmonale Heparin drip- changed to xarelto No evidence of right heart strain Echo: Left ventricular ejection fraction, by estimation, is 55 to 60%. The  left ventricle has normal function. The left ventricle has no regional  wall motion abnormalities. Left ventricular diastolic parameters were  normal.  Elevated troponin Likely related to PE   Hypertension -resume home meds  Mixed hyperlipidemia Continue Crestor  GERD Continue PPI    Medical Consultants:      Discharge Exam:   Vitals:   12/17/20 1036 12/17/20 1229  BP: (!) 112/92 115/78  Pulse: 64 73  Resp:    Temp:  97.9 F (36.6 C)  SpO2: 99% 99%   Vitals:   12/16/20 2120 12/17/20 0527 12/17/20 1036 12/17/20 1229  BP: (!) 113/53 98/73 (!) 112/92 115/78  Pulse: 73 67 64 73  Resp:      Temp: 97.9 F (36.6 C) 97.6 F (36.4 C)  97.9 F (36.6 C)  TempSrc: Oral Oral    SpO2: 98% 98% 99% 99%  Weight:      Height:        General exam: Appears calm and comfortable.   The results of significant diagnostics from this hospitalization (including imaging, microbiology, ancillary and laboratory) are listed below for reference.     Procedures and Diagnostic Studies:   DG Chest 2 View  Result Date: 12/15/2020 CLINICAL DATA:  Chest pain EXAM: CHEST - 2 VIEW COMPARISON:  07/08/2020 FINDINGS: The heart size and mediastinal contours are within  normal limits. Both lungs are clear. The visualized skeletal structures are unremarkable. IMPRESSION: No active cardiopulmonary disease. Electronically Signed   By: Jasmine Pang M.D.   On: 12/15/2020 20:55   CT Angio Chest PE W and/or Wo Contrast  Result Date: 12/16/2020 CLINICAL DATA:  Chest pain, evaluate for PE EXAM: CT ANGIOGRAPHY CHEST  WITH CONTRAST TECHNIQUE: Multidetector CT imaging of the chest was performed using the standard protocol during bolus administration of intravenous contrast. Multiplanar CT image reconstructions and MIPs were obtained to evaluate the vascular anatomy. CONTRAST:  17mL OMNIPAQUE IOHEXOL 350 MG/ML SOLN COMPARISON:  Chest radiographs dated 12/15/2020 FINDINGS: Cardiovascular: Satisfactory opacification of the bilateral pulmonary arteries to the segmental level. Lobar pulmonary emboli in the right upper lobe (series 5/image 56) and right middle/lower lobe pulmonary arteries (series 5/image 70). Lobar/segmental left lower lobe pulmonary emboli (series 5/image 81). Additional segmental pulmonary emboli in the left upper lobe (series 5/image 46). Overall clot burden is moderate to large. Although not tailored for evaluation of the thoracic aorta, there is no evidence of thoracic aortic aneurysm or dissection. The heart is normal in size. No pericardial effusion. No evidence of right heart strain (RV to LV ratio 0.88). Mediastinum/Nodes: No suspicious mediastinal lymphadenopathy. Visualized thyroid is unremarkable. Lungs/Pleura: Lungs are clear. No suspicious pulmonary nodules. No focal consolidation. No pleural effusion or pneumothorax. Upper Abdomen: Visualized upper abdomen is grossly unremarkable, noting vascular calcifications. Musculoskeletal: Visualized osseous structures are within normal limits. Review of the MIP images confirms the above findings. IMPRESSION: Lobar/segmental pulmonary emboli in all lobes. Overall clot burden is moderate to large. No evidence of right heart strain. Critical Value/emergent results were called by telephone at the time of interpretation on 12/16/2020 at 2:10 am to provider Dr. Jacqulyn Bath, who verbally acknowledged these results. Electronically Signed   By: Charline Bills M.D.   On: 12/16/2020 02:13     Labs:   Basic Metabolic Panel: Recent Labs  Lab 12/15/20 2029 12/17/20 0100   NA 139 136  K 3.7 3.6  CL 107 105  CO2 25 26  GLUCOSE 93 117*  BUN 15 12  CREATININE 0.95 0.69  CALCIUM 9.0 8.6*  MG  --  1.7   GFR Estimated Creatinine Clearance: 129 mL/min (by C-G formula based on SCr of 0.69 mg/dL). Liver Function Tests: Recent Labs  Lab 12/15/20 2029 12/17/20 0100  AST 18 23  ALT 14 12  ALKPHOS 71 68  BILITOT 0.3 0.2*  PROT 7.4 6.7  ALBUMIN 3.3* 2.9*   No results for input(s): LIPASE, AMYLASE in the last 168 hours. No results for input(s): AMMONIA in the last 168 hours. Coagulation profile No results for input(s): INR, PROTIME in the last 168 hours.  CBC: Recent Labs  Lab 12/15/20 2029 12/16/20 0534 12/17/20 0100  WBC 6.1 7.7 7.0  NEUTROABS 3.6 4.0  --   HGB 12.8 12.2 11.8*  HCT 39.0 37.8 35.7*  MCV 91.8 92.9 91.3  PLT 267 250 252   Cardiac Enzymes: No results for input(s): CKTOTAL, CKMB, CKMBINDEX, TROPONINI in the last 168 hours. BNP: Invalid input(s): POCBNP CBG: No results for input(s): GLUCAP in the last 168 hours. D-Dimer Recent Labs    12/15/20 2029  DDIMER 11.12*   Hgb A1c No results for input(s): HGBA1C in the last 72 hours. Lipid Profile No results for input(s): CHOL, HDL, LDLCALC, TRIG, CHOLHDL, LDLDIRECT in the last 72 hours. Thyroid function studies No results for input(s): TSH, T4TOTAL, T3FREE, THYROIDAB in the last 72 hours.  Invalid  input(s): FREET3 Anemia work up No results for input(s): VITAMINB12, FOLATE, FERRITIN, TIBC, IRON, RETICCTPCT in the last 72 hours. Microbiology Recent Results (from the past 240 hour(s))  Resp Panel by RT-PCR (Flu A&B, Covid) Nasopharyngeal Swab     Status: None   Collection Time: 12/16/20  1:39 AM   Specimen: Nasopharyngeal Swab; Nasopharyngeal(NP) swabs in vial transport medium  Result Value Ref Range Status   SARS Coronavirus 2 by RT PCR NEGATIVE NEGATIVE Final    Comment: (NOTE) SARS-CoV-2 target nucleic acids are NOT DETECTED.  The SARS-CoV-2 RNA is generally detectable  in upper respiratory specimens during the acute phase of infection. The lowest concentration of SARS-CoV-2 viral copies this assay can detect is 138 copies/mL. A negative result does not preclude SARS-Cov-2 infection and should not be used as the sole basis for treatment or other patient management decisions. A negative result may occur with  improper specimen collection/handling, submission of specimen other than nasopharyngeal swab, presence of viral mutation(s) within the areas targeted by this assay, and inadequate number of viral copies(<138 copies/mL). A negative result must be combined with clinical observations, patient history, and epidemiological information. The expected result is Negative.  Fact Sheet for Patients:  BloggerCourse.com  Fact Sheet for Healthcare Providers:  SeriousBroker.it  This test is no t yet approved or cleared by the [place] FDA and  has been authorized for detection and/or diagnosis of SARS-CoV-2 by FDA under an Emergency Use Authorization (EUA). This EUA will remain  in effect (meaning this test can be used) for the duration of the COVID-19 declaration under Section 564(b)(1) of the Act, 21 U.S.C.section 360bbb-3(b)(1), unless the authorization is terminated  or revoked sooner.       Influenza A by PCR NEGATIVE NEGATIVE Final   Influenza B by PCR NEGATIVE NEGATIVE Final    Comment: (NOTE) The Xpert Xpress SARS-CoV-2/FLU/RSV plus assay is intended as an aid in the diagnosis of influenza from Nasopharyngeal swab specimens and should not be used as a sole basis for treatment. Nasal washings and aspirates are unacceptable for Xpert Xpress SARS-CoV-2/FLU/RSV testing.  Fact Sheet for Patients: BloggerCourse.com  Fact Sheet for Healthcare Providers: SeriousBroker.it  This test is not yet approved or cleared by the [place] FDA and has  been authorized for detection and/or diagnosis of SARS-CoV-2 by FDA under an Emergency Use Authorization (EUA). This EUA will remain in effect (meaning this test can be used) for the duration of the COVID-19 declaration under Section 564(b)(1) of the Act, 21 U.S.C. section 360bbb-3(b)(1), unless the authorization is terminated or revoked.  Performed at [facility], [address], [place], [place] [address]      Discharge Instructions:   Discharge Instructions     Diet - low sodium heart healthy   Complete by: As directed    Increase activity slowly   Complete by: As directed       Allergies as of 12/17/2020       Reactions   Penicillins Anaphylaxis   Has patient had a PCN reaction causing immediate rash, facial/tongue/throat swelling, SOB or lightheadedness with hypotension: Yes Has patient had a PCN reaction causing severe rash involving mucus membranes or skin necrosis: No Has patient had a PCN reaction that required hospitalization No Has patient had a PCN reaction occurring within the last 10 years: No If all of the above answers are "NO", then may proceed with Cephalosporin use.        Medication List     STOP  taking these medications    aspirin EC 81 MG tablet   famotidine 20 MG tablet Commonly known as: PEPCID   meclizine 25 MG tablet Commonly known as: ANTIVERT   metaxalone 800 MG tablet Commonly known as: SKELAXIN   metoprolol tartrate 25 MG tablet Commonly known as: LOPRESSOR   ondansetron 4 MG disintegrating tablet Commonly known as: Zofran ODT   pantoprazole 20 MG tablet Commonly known as: PROTONIX       TAKE these medications    acetaminophen 500 MG tablet Commonly known as: TYLENOL Take 1,000 mg by mouth every 6 (six) hours as needed for moderate pain, mild pain or headache.   losartan-hydrochlorothiazide 100-25 MG tablet Commonly known as: HYZAAR Take 1 tablet by mouth daily. Start taking on: December 19, 2020    multivitamin with minerals Tabs tablet Take 1 tablet by mouth daily.   paragard intrauterine copper Iud IUD by Intrauterine route continuous. Since 01/2019.   Rivaroxaban Stater Pack (15 mg and 20 mg) Commonly known as: XARELTO STARTER PACK Follow package directions: Take one 15mg  tablet by mouth twice a day. On day 22, switch to one 20mg  tablet once a day. Take with food.   rosuvastatin 10 MG tablet Commonly known as: CRESTOR Take 10 mg by mouth at bedtime.        Follow-up Information     [facility], [facility] Follow up in 1 week(s).   Specialty: Family Medicine Why: CBC                 Time coordinating discharge: 35 min  Signed:  DO  [facility] 12/18/2020, 4:50 PM

## 2021-02-15 ENCOUNTER — Ambulatory Visit (HOSPITAL_COMMUNITY)
Admission: EM | Admit: 2021-02-15 | Discharge: 2021-02-15 | Disposition: A | Discharge status: Home / Self Care | Payer: 59 | Attending: Family Medicine | Admitting: Family Medicine

## 2021-02-15 ENCOUNTER — Other Ambulatory Visit: Payer: Self-pay

## 2021-02-15 ENCOUNTER — Encounter (HOSPITAL_COMMUNITY): Payer: Self-pay

## 2021-02-15 DIAGNOSIS — M79671 Pain in right foot: Secondary | ICD-10-CM | POA: Diagnosis not present

## 2021-02-15 NOTE — ED Triage Notes (Signed)
Pt presents with c/o R foot pain x 1 hour ago.   Pt states the pain started out of no where and states flexing her toe causes pain. States she did not hit it anywhere.

## 2021-02-16 NOTE — ED Provider Notes (Signed)
[facility] CARE CENTER   [id number] 02/15/21 Arrival Time: 1920  ASSESSMENT & PLAN:  1. Foot pain, right    No indication for plain imaging at this time.  OTC ibuprofen. WBAT. Work note provided.  Recommend:  Follow-up Information     Medicine, [facility].   Specialty: Family Medicine Why: As needed.               Reviewed expectations re: course of current medical issues. Questions answered. Outlined signs and symptoms indicating need for more acute intervention. Patient verbalized understanding. After Visit Summary given.  SUBJECTIVE: History from: patient. Mindy Wade is a 40 y.o. female who reports non-traumatic distal dorsal R foot pain; today. Has been standing/walking a lot at work. No swelling/bruising. No extremity sensation changes or weakness. No tx PTA. Ambulatory here.  Past Surgical History:  Procedure Laterality Date   CHOLECYSTECTOMY        OBJECTIVE:  Vitals:   02/15/21 1940  BP: (!) 122/92  Pulse: 66  Resp: 20  Temp: 98.5 F (36.9 C)  TempSrc: Oral  SpO2: 100%    General appearance: alert; no distress HEENT: [place]; AT Neck: supple with FROM Resp: unlabored respirations Extremities: RLE: warm with well perfused appearance; poorly localized mild tenderness over right distal dorsal foot at MTP joings; without gross deformities; swelling: none; bruising: none; ankle and toes with  FROM CV: brisk extremity capillary refill of RLE; 2+ DP pulse of RLE. Skin: warm and dry; no visible rashes Neurologic: gait normal; normal sensation and strength of RLE Psychological: alert and cooperative; normal mood and affect  Imaging: No results found.    Allergies  Allergen Reactions   Penicillins Anaphylaxis    Has patient had a PCN reaction causing immediate rash, facial/tongue/throat swelling, SOB or lightheadedness with hypotension: Yes Has patient had a PCN reaction causing severe rash involving mucus membranes or  skin necrosis: No Has patient had a PCN reaction that required hospitalization No Has patient had a PCN reaction occurring within the last 10 years: No If all of the above answers are "NO", then may proceed with Cephalosporin use.     Past Medical History:  Diagnosis Date   AMA (advanced maternal age) multigravida 35+    Breast feeding status of mother    GERD (gastroesophageal reflux disease)    Headache    Hx of blood clots    Hx of varicella    Hyperlipemia    Hypertension    takes meds   Pregnancy induced hypertension    Pulmonary embolism (HCC)    Social History   Socioeconomic History   Marital status: Married    Spouse name: Not on file   Number of children: Not on file   Years of education: Not on file   Highest education level: Not on file  Occupational History   Not on file  Tobacco Use   Smoking status: Never   Smokeless tobacco: Never  Vaping Use   Vaping Use: Never used  Substance and Sexual Activity   Alcohol use: No   Drug use: No   Sexual activity: Yes    Birth control/protection: None  Other Topics Concern   Not on file  Social History Narrative   Not on file   Social Determinants of Health   Financial Resource Strain: Not on file  Food Insecurity: Not on file  Transportation Needs: Not on file  Physical Activity: Not on file  Stress: Not on file  Social Connections: Not  on file   Family History  Problem Relation Age of Onset   Asthma Mother    Heart disease Mother    Hypertension Mother    Stroke Mother    Heart attack Mother 20   Cancer Father        prostate   Alcohol abuse Neg Hx    Arthritis Neg Hx    Birth defects Neg Hx    COPD Neg Hx    Depression Neg Hx    Diabetes Neg Hx    Drug abuse Neg Hx    Early death Neg Hx    Hearing loss Neg Hx    Hyperlipidemia Neg Hx    Kidney disease Neg Hx    Learning disabilities Neg Hx    Mental illness Neg Hx    Mental retardation Neg Hx    Miscarriages / Stillbirths Neg Hx     Vision loss Neg Hx    Varicose Veins Neg Hx    Past Surgical History:  Procedure Laterality Date   Barrington Ellison, MD 02/16/21 [id number]

## 2021-02-25 ENCOUNTER — Encounter (HOSPITAL_COMMUNITY): Payer: Self-pay | Admitting: Emergency Medicine

## 2021-02-25 ENCOUNTER — Emergency Department (HOSPITAL_COMMUNITY): Payer: 59

## 2021-02-25 ENCOUNTER — Other Ambulatory Visit: Payer: Self-pay

## 2021-02-25 ENCOUNTER — Emergency Department (HOSPITAL_COMMUNITY)
Admission: EM | Admit: 2021-02-25 | Discharge: 2021-02-25 | Disposition: A | Discharge status: Home / Self Care | Payer: 59 | Attending: Emergency Medicine | Admitting: Emergency Medicine

## 2021-02-25 DIAGNOSIS — Z7901 Long term (current) use of anticoagulants: Secondary | ICD-10-CM | POA: Insufficient documentation

## 2021-02-25 DIAGNOSIS — I10 Essential (primary) hypertension: Secondary | ICD-10-CM | POA: Diagnosis not present

## 2021-02-25 DIAGNOSIS — Z79899 Other long term (current) drug therapy: Secondary | ICD-10-CM | POA: Diagnosis not present

## 2021-02-25 DIAGNOSIS — M79671 Pain in right foot: Secondary | ICD-10-CM | POA: Diagnosis not present

## 2021-02-25 MED ORDER — ACETAMINOPHEN 500 MG PO TABS
1000.0000 mg | ORAL_TABLET | Freq: Once | ORAL | Status: AC
  Administered 2021-02-25: 1000 mg via ORAL
  Filled 2021-02-25: qty 2

## 2021-02-25 NOTE — ED Triage Notes (Signed)
C/o R foot pain x 1 week.  Denies injury.  States pain started on top of foot but now includes bottom of foot and toes.

## 2021-02-25 NOTE — Discharge Instructions (Addendum)
It was our pleasure to provide your ER care today - we hope that you feel better.  Overall, your xray looks good, no fracture is noted.   It is possible you pain could be from tendonitis, ligament strain, or subtle stress fracture.    Take acetaminophen as need for pain. Try ice, rest and elevated after up and about.  May try foot/shoe orthotic or cushion for symptom relief. Wear supportive shoes, avoid heels. Try gentle massage of sore area.   Follow up with primary care doctor or foot specialist in the next 1-2 weeks if symptoms fail to improve/resolve.  Return to ER if worse, new symptoms, fevers, severe pain, or other concern.

## 2021-02-25 NOTE — ED Provider Notes (Addendum)
[facility] EMERGENCY DEPARTMENT Provider Note   CSN: [id number] Arrival date & time: 02/25/21  1102     History Chief Complaint  Patient presents with   Foot Pain    Mindy Wade is a 40 y.o. female.  Patient c/o pain to mid right foot in past 1-2 weeks. Symptoms acute onset, moderate, constant, persistent, dull, non radiating, worse w walking, bearing weight, actively moving toes. Feels mainly on dorsum mid to distal foot, and also a little bit on plantar side of foot. No heel pain. No toe pain. Pt has not noted an ulcer, lesions or discoloration of foot or toes. No trauma or injury recalled. Is on blood thinner therapy for hx PE, compliant w meds, denies abnormal bruising or bleeding. No associated numbness/weakness. No ankle pain. No leg or foot swelling. No fevers.   The history is provided by the patient and medical records.  Foot Pain Pertinent negatives include no chest pain and no shortness of breath.      Past Medical History:  Diagnosis Date   AMA (advanced maternal age) multigravida 35+    Breast feeding status of mother    GERD (gastroesophageal reflux disease)    Headache    Hx of blood clots    Hx of varicella    Hyperlipemia    Hypertension    takes meds   Pregnancy induced hypertension    Pulmonary embolism [facility])     Patient Active Problem List   Diagnosis Date Noted   Acute pulmonary embolism without acute cor pulmonale (HCC) 12/16/2020   Essential hypertension 12/16/2020   Mixed hyperlipidemia 12/16/2020   GERD without esophagitis 12/16/2020   Elevated troponin level not due myocardial infarction 12/16/2020   Term pregnancy 04/25/2018   Vaginal delivery 04/25/2018   Adherent placenta 04/25/2018   Missed abortion with fetal demise before 20 completed weeks of gestation 05/03/2017   Precipitous delivery 02/19/2015   Postpartum care following vaginal delivery 02/19/2015   Active labor at term 02/18/2015   Pre-diabetes  05/14/2014   Elevated LDL cholesterol level 05/14/2014   Pulmonary embolism (HCC) 10/17/2013   Labor and delivery indication for care or intervention 10/09/2013   Spontaneous abortion in second trimester 10/09/2013   SVD (spontaneous vaginal delivery) 03/02/2013    Past Surgical History:  Procedure Laterality Date   CHOLECYSTECTOMY       OB History     Gravida  9   Para  8   Term  7   Preterm  1   AB  1   Living  7      SAB  0   IAB  1   Ectopic      Multiple  0   Live Births  8           Family History  Problem Relation Age of Onset   Asthma Mother    Heart disease Mother    Hypertension Mother    Stroke Mother    Heart attack Mother 61   Cancer Father        prostate   Alcohol abuse Neg Hx    Arthritis Neg Hx    Birth defects Neg Hx    COPD Neg Hx    Depression Neg Hx    Diabetes Neg Hx    Drug abuse Neg Hx    Early death Neg Hx    Hearing loss Neg Hx    Hyperlipidemia Neg Hx    Kidney disease Neg Hx  Learning disabilities Neg Hx    Mental illness Neg Hx    Mental retardation Neg Hx    Miscarriages / Stillbirths Neg Hx    Vision loss Neg Hx    Varicose Veins Neg Hx     Social History   Tobacco Use   Smoking status: Never   Smokeless tobacco: Never  Vaping Use   Vaping Use: Never used  Substance Use Topics   Alcohol use: No   Drug use: No    Home Medications Prior to Admission medications   Medication Sig Start Date End Date Taking? Authorizing Provider  acetaminophen (TYLENOL) 500 MG tablet Take 1,000 mg by mouth every 6 (six) hours as needed for moderate pain, mild pain or headache.    [provider]  losartan-hydrochlorothiazide (HYZAAR) 100-25 MG tablet Take 1 tablet by mouth daily. 12/19/20   Joseph Art, DO  Multiple Vitamin (MULTIVITAMIN WITH MINERALS) TABS tablet Take 1 tablet by mouth daily.    [provider]  paragard intrauterine copper IUD IUD by Intrauterine route continuous. Since  01/2019.    [provider]  RIVAROXABAN Carlena Hurl) VTE STARTER PACK (15 & 20 MG) Follow package directions: Take one 15mg  tablet by mouth twice a day. On day 22, switch to one 20mg  tablet once a day. Take with food. 12/17/20   , DO  rosuvastatin (CRESTOR) 10 MG tablet Take 10 mg by mouth at bedtime. 01/18/20   [provider]  enoxaparin (LOVENOX) 150 MG/ML injection Inject 0.73 mLs (110 mg total) into the skin daily. Patient not taking: Reported on 01/14/2019 09/22/18 01/14/19  09/24/18, PA-C  omeprazole (PRILOSEC) 20 MG capsule Take 1 capsule (20 mg total) by mouth daily. Patient not taking: Reported on 09/22/2018 07/05/18 01/14/19  09/04/18, PA-C    Allergies    Penicillins  Review of Systems   Review of Systems  Constitutional:  Negative for chills and fever.  Respiratory:  Negative for shortness of breath.   Cardiovascular:  Negative for chest pain and leg swelling.  Musculoskeletal:        Right foot pain  Skin:  Negative for rash and wound.  Neurological:  Negative for weakness and numbness.   Physical Exam Updated Vital Signs BP 114/74 (BP Location: Left Arm)    Pulse 80    Temp 98.9 F (37.2 C) (Oral)    Resp 17    LMP 02/05/2021 (Exact Date)    SpO2 100%   Physical Exam Vitals and nursing note reviewed.  Constitutional:      Appearance: Normal appearance. She is well-developed.  HENT:     Head: Atraumatic.     Nose: Nose normal.     Mouth/Throat:     Mouth: Mucous membranes are moist.  Eyes:     General: No scleral icterus.    Conjunctiva/sclera: Conjunctivae normal.  Neck:     Trachea: No tracheal deviation.  Cardiovascular:     Rate and Rhythm: Normal rate.     Pulses: Normal pulses.  Pulmonary:     Effort: Pulmonary effort is normal. No respiratory distress.  Genitourinary:    Comments: No cva tenderness.  Musculoskeletal:        General: No swelling.     Cervical back: Neck supple. No muscular tenderness.      Comments: Mild tenderness over dorsum right mid to distal foot. No focal/point bony tenderness. No pain w passive rom toes. No swelling noted. Foot and toes of normal color and  warmth. Normal cap refill distally, dp/pt 2+. Skin intact, no lesions or ulceration noted.   Skin:    General: Skin is warm and dry.     Findings: No rash.  Neurological:     Mental Status: She is alert.     Comments: Alert, speech normal. Right foot nvi.  Psychiatric:        Mood and Affect: Mood normal.    ED Results / Procedures / Treatments   Labs (all labs ordered are listed, but only abnormal results are displayed) Labs Reviewed - No data to display  EKG None  Radiology DG Foot Complete Right  Result Date: 02/25/2021 CLINICAL DATA:  Right foot pain beginning last week.  No injury. EXAM: RIGHT FOOT COMPLETE - 3+ VIEW COMPARISON:  10/08/2008. FINDINGS: No fracture or bone lesion. Joints are normally spaced and aligned. Small plantar and dorsal calcaneal spurs, developing since the prior radiographs. Normal soft tissues. IMPRESSION: 1. No fracture or acute finding.  No joint abnormality. 2. Calcaneal spurs. Electronically Signed   By: Amie Portland M.D.   On: 02/25/2021 12:51    Procedures Procedures   Medications Ordered in ED Medications - No data to display  ED Course  I have reviewed the triage vital signs and the nursing notes.  Pertinent labs & imaging results that were available during my care of the patient were reviewed by me and considered in my medical decision making (see chart for details).    MDM Rules/Calculators/A&P                         Xrays obtained.   Reviewed nursing notes and prior charts for additional history.   Xrays reviewed/interpreted by me - ?possible tendonitis or subacute/subclinical ligamentous sprain/strain. On exam, no sign of infection. No swelling.   Pt last took med/acetaminophen at 9 am.   Acetaminophen po.   Pt currently appears stable for d/c.    Rec pcp f/u.      Final Clinical Impression(s) / ED Diagnoses Final diagnoses:  None    Rx / DC Orders ED Discharge Orders     None           Cathren Laine, MD 02/25/21 1306

## 2021-02-25 NOTE — ED Provider Notes (Signed)
Emergency Medicine Provider Triage Evaluation Note  Cataleia Gade , a 40 y.o. female  was evaluated in triage.  Pt complains of right foot pain, started last week.  She denies any trauma but suddenly had a sharp pain through her foot.  Pain has worsened and she now has some pain on the bottom of her foot as well.  No overlying skin changes or swelling.  Reports that she is on her feet throughout the day for her job.  Was seen at urgent care when pain initially started, they examine did not see any abnormalities, she has been treating supportively for possible strain with no improvement.  Has not had any x-rays done.  Review of Systems  Positive: Foot pain Negative: Numbness, weakness, fevers, rash  Physical Exam  BP 114/74 (BP Location: Left Arm)    Pulse 80    Temp 98.9 F (37.2 C) (Oral)    Resp 17    LMP 02/05/2021 (Exact Date)    SpO2 100%  Gen:   Awake, no distress   Resp:  Normal effort  MSK:   Moves extremities without difficulty, right foot with tenderness over the top of the foot, no overlying skin changes, DP and PT pulses 2+, normal sensation, able to wiggle toes with some pain.  No tenderness or swelling at the ankle Other:    Medical Decision Making  Medically screening exam initiated at 11:59 AM.  Appropriate orders placed.  Theodis Blaze was informed that the remainder of the evaluation will be completed by another provider, this initial triage assessment does not replace that evaluation, and the importance of remaining in the ED until their evaluation is complete.     Dartha Lodge, PA-C 02/25/21 1204    Linwood Dibbles, MD 02/25/21 [id number]

## 2021-03-15 ENCOUNTER — Ambulatory Visit: Payer: 59 | Admitting: Orthopaedic Surgery

## 2021-04-17 ENCOUNTER — Emergency Department (HOSPITAL_COMMUNITY)
Admission: EM | Admit: 2021-04-17 | Discharge: 2021-04-17 | Disposition: A | Discharge status: Home / Self Care | Payer: 59 | Attending: Student | Admitting: Student

## 2021-04-17 ENCOUNTER — Emergency Department (HOSPITAL_COMMUNITY): Payer: 59

## 2021-04-17 ENCOUNTER — Encounter (HOSPITAL_COMMUNITY): Payer: Self-pay | Admitting: Emergency Medicine

## 2021-04-17 DIAGNOSIS — Z79899 Other long term (current) drug therapy: Secondary | ICD-10-CM | POA: Insufficient documentation

## 2021-04-17 DIAGNOSIS — I10 Essential (primary) hypertension: Secondary | ICD-10-CM | POA: Insufficient documentation

## 2021-04-17 DIAGNOSIS — M549 Dorsalgia, unspecified: Secondary | ICD-10-CM | POA: Insufficient documentation

## 2021-04-17 DIAGNOSIS — R0789 Other chest pain: Secondary | ICD-10-CM

## 2021-04-17 LAB — CBC
HCT: 37.8 % (ref 36.0–46.0)
Hemoglobin: 12.3 g/dL (ref 12.0–15.0)
MCH: 29.4 pg (ref 26.0–34.0)
MCHC: 32.5 g/dL (ref 30.0–36.0)
MCV: 90.2 fL (ref 80.0–100.0)
Platelets: 287 10*3/uL (ref 150–400)
RBC: 4.19 MIL/uL (ref 3.87–5.11)
RDW: 14.4 % (ref 11.5–15.5)
WBC: 6.3 10*3/uL (ref 4.0–10.5)
nRBC: 0 % (ref 0.0–0.2)

## 2021-04-17 LAB — BASIC METABOLIC PANEL
Anion gap: 11 (ref 5–15)
BUN: 17 mg/dL (ref 6–20)
CO2: 21 mmol/L — ABNORMAL LOW (ref 22–32)
Calcium: 8.9 mg/dL (ref 8.9–10.3)
Chloride: 101 mmol/L (ref 98–111)
Creatinine, Ser: 0.64 mg/dL (ref 0.44–1.00)
GFR, Estimated: >60 mL/min (ref >60)
Glucose, Bld: 100 mg/dL — ABNORMAL HIGH (ref 70–99)
Potassium: 4.3 mmol/L (ref 3.5–5.1)
Sodium: 133 mmol/L — ABNORMAL LOW (ref 135–145)

## 2021-04-17 LAB — I-STAT BETA HCG BLOOD, ED (MC, WL, AP ONLY): I-stat hCG, quantitative: <5 m[IU]/mL (ref <5)

## 2021-04-17 LAB — TROPONIN I (HIGH SENSITIVITY)
Troponin I (High Sensitivity): 4 ng/L (ref <18)
Troponin I (High Sensitivity): 4 ng/L (ref <18)

## 2021-04-17 MED ORDER — IOHEXOL 350 MG/ML SOLN
100.0000 mL | Freq: Once | INTRAVENOUS | Status: AC | PRN
  Administered 2021-04-17: 100 mL via INTRAVENOUS

## 2021-04-17 NOTE — ED Provider Notes (Signed)
[facility] EMERGENCY DEPARTMENT Provider Note  CSN: [id number] Arrival date & time: 04/17/21 1320  Chief Complaint(s) Chest Pain  HPI Mindy Wade is a 41 y.o. female with PMH pulmonary embolism on Xarelto who presents the emergency department for evaluation of chest pain.  Patient states that 48 hours ago she had the episode of rough sex and had acute onset back pain the following day.  She states that her back pain has improved but she currently has chest tightness and pleurisy.  She denies shortness of breath and states that with her previous pulmonary embolism she did have shortness of breath.  She denies additional known trauma to the chest wall and denies headache, cough, fever or other systemic symptoms.   Chest Pain  Past Medical History Past Medical History:  Diagnosis Date   AMA (advanced maternal age) multigravida 35+    Breast feeding status of mother    GERD (gastroesophageal reflux disease)    Headache    Hx of blood clots    Hx of varicella    Hyperlipemia    Hypertension    takes meds   Pregnancy induced hypertension    Pulmonary embolism [facility])    Patient Active Problem List   Diagnosis Date Noted   Acute pulmonary embolism without acute cor pulmonale (HCC) 12/16/2020   Essential hypertension 12/16/2020   Mixed hyperlipidemia 12/16/2020   GERD without esophagitis 12/16/2020   Elevated troponin level not due myocardial infarction 12/16/2020   Term pregnancy 04/25/2018   Vaginal delivery 04/25/2018   Adherent placenta 04/25/2018   Missed abortion with fetal demise before 20 completed weeks of gestation 05/03/2017   Precipitous delivery 02/19/2015   Postpartum care following vaginal delivery 02/19/2015   Active labor at term 02/18/2015   Pre-diabetes 05/14/2014   Elevated LDL cholesterol level 05/14/2014   Pulmonary embolism (HCC) 10/17/2013   Labor and delivery indication for care or intervention 10/09/2013   Spontaneous abortion  in second trimester 10/09/2013   SVD (spontaneous vaginal delivery) 03/02/2013   Home Medication(s) Prior to Admission medications   Medication Sig Start Date End Date Taking? Authorizing Provider  acetaminophen (TYLENOL) 500 MG tablet Take 1,000 mg by mouth every 6 (six) hours as needed for moderate pain, mild pain or headache.    [provider]  losartan-hydrochlorothiazide (HYZAAR) 100-25 MG tablet Take 1 tablet by mouth daily. 12/19/20   Joseph Art, DO  Multiple Vitamin (MULTIVITAMIN WITH MINERALS) TABS tablet Take 1 tablet by mouth daily.    [provider]  paragard intrauterine copper IUD IUD by Intrauterine route continuous. Since 01/2019.    [provider]  RIVAROXABAN Carlena Hurl) VTE STARTER PACK (15 & 20 MG) Follow package directions: Take one 15mg  tablet by mouth twice a day. On day 22, switch to one 20mg  tablet once a day. Take with food. 12/17/20   , DO  rosuvastatin (CRESTOR) 10 MG tablet Take 10 mg by mouth at bedtime. 01/18/20   [provider]  enoxaparin (LOVENOX) 150 MG/ML injection Inject 0.73 mLs (110 mg total) into the skin daily. Patient not taking: Reported on 01/14/2019 09/22/18 01/14/19  09/24/18, PA-C  omeprazole (PRILOSEC) 20 MG capsule Take 1 capsule (20 mg total) by mouth daily. Patient not taking: Reported on 09/22/2018 07/05/18 01/14/19  09/04/18, PA-C  Past Surgical History Past Surgical History:  Procedure Laterality Date   CHOLECYSTECTOMY     Family History Family History  Problem Relation Age of Onset   Asthma Mother    Heart disease Mother    Hypertension Mother    Stroke Mother    Heart attack Mother 30   Cancer Father        prostate   Alcohol abuse Neg Hx    Arthritis Neg Hx    Birth defects Neg Hx    COPD Neg Hx    Depression Neg Hx    Diabetes Neg  Hx    Drug abuse Neg Hx    Early death Neg Hx    Hearing loss Neg Hx    Hyperlipidemia Neg Hx    Kidney disease Neg Hx    Learning disabilities Neg Hx    Mental illness Neg Hx    Mental retardation Neg Hx    Miscarriages / Stillbirths Neg Hx    Vision loss Neg Hx    Varicose Veins Neg Hx     Social History Social History   Tobacco Use   Smoking status: Never   Smokeless tobacco: Never  Vaping Use   Vaping Use: Never used  Substance Use Topics   Alcohol use: No   Drug use: No   Allergies Penicillins  Review of Systems Review of Systems  Cardiovascular:  Positive for chest pain.   Physical Exam Vital Signs  I have reviewed the triage vital signs BP 113/77    Pulse 72    Temp 98.3 F (36.8 C) (Oral)    Resp 16    LMP 03/17/2021 (Within Days)    SpO2 99%   Physical Exam Vitals and nursing note reviewed.  Constitutional:      General: She is not in acute distress.    Appearance: She is well-developed.  HENT:     Head: Normocephalic and atraumatic.  Eyes:     Conjunctiva/sclera: Conjunctivae normal.  Cardiovascular:     Rate and Rhythm: Normal rate and regular rhythm.     Heart sounds: No murmur heard. Pulmonary:     Effort: Pulmonary effort is normal. No respiratory distress.     Breath sounds: Normal breath sounds.  Abdominal:     Palpations: Abdomen is soft.     Tenderness: There is no abdominal tenderness.  Musculoskeletal:        General: No swelling.     Cervical back: Neck supple.  Skin:    General: Skin is warm and dry.     Capillary Refill: Capillary refill takes less than 2 seconds.  Neurological:     Mental Status: She is alert.  Psychiatric:        Mood and Affect: Mood normal.    ED Results and Treatments Labs (all labs ordered are listed, but only abnormal results are displayed) Labs Reviewed  BASIC METABOLIC PANEL - Abnormal; Notable for the following components:      Result Value   Sodium 133 (*)    CO2 21 (*)    Glucose, Bld 100  (*)    All other components within normal limits  CBC  I-STAT BETA HCG BLOOD, ED ([facility], [facility], [facility] ONLY)  TROPONIN I (HIGH SENSITIVITY)  TROPONIN I (HIGH SENSITIVITY)  Radiology DG Chest 2 View  Result Date: 04/17/2021 CLINICAL DATA:  Chest pain EXAM: CHEST - 2 VIEW COMPARISON:  12/15/2020 FINDINGS: The heart size and mediastinal contours are within normal limits. Both lungs are clear. The visualized skeletal structures are unremarkable except for slight curvature of the spine which may be positional. Remote cholecystectomy. Nonobstructive bowel gas pattern. IMPRESSION: No active cardiopulmonary disease. Electronically Signed   By: Judie PetitM.  Shick M.D.   On: 04/17/2021 14:13    Pertinent labs & imaging results that were available during my care of the patient were reviewed by me and considered in my medical decision making (see MDM for details).  Medications Ordered in ED Medications - No data to display                                                                                                                                   Procedures Procedures  (including critical care time)  Medical Decision Making / ED Course   This patient presents to the ED for concern of chest tightness, this involves an extensive number of treatment options, and is a complaint that carries with it a high risk of complications and morbidity.  The differential diagnosis includes pneumonia, pneumonitis, pulmonary embolism, asthma, musculoskeletal chest pain  MDM: Patient seen emergency department for evaluation of chest tightness.  Physical exam is unremarkable.  Laboratory evaluation largely unremarkable outside of a CO2 of 21.  No wheezing on lung exam.  Low suspicion for hypercarbia.  Troponin negative.  Pregnancy negative.  ECG nonischemic.  CT PE with no persistent blood clot which was the  patient's primary concern.  On reevaluation, patient states symptoms improved and she was discharged with outpatient follow-up.  At no point did the patient have any chest pain and I have low suspicion for ACS.   Additional history obtained: -External records from outside source obtained and reviewed including: Chart review including previous notes, labs, imaging, consultation notes   Lab Tests: -I ordered, reviewed, and interpreted labs.   The pertinent results include:   Labs Reviewed  BASIC METABOLIC PANEL - Abnormal; Notable for the following components:      Result Value   Sodium 133 (*)    CO2 21 (*)    Glucose, Bld 100 (*)    All other components within normal limits  CBC  I-STAT BETA HCG BLOOD, ED ([facility], [facility], [facility] ONLY)  TROPONIN I (HIGH SENSITIVITY)  TROPONIN I (HIGH SENSITIVITY)      EKG   EKG Interpretation  Date/Time:  Monday April 17 2021 13:44:27 EST Ventricular Rate:  72 PR Interval:  138 QRS Duration: 86 QT Interval:  368 QTC Calculation: 402 R Axis:   75 Text Interpretation: Sinus rhythm with marked sinus arrhythmia When compared with ECG of 15-Dec-2020 20:07, PREVIOUS ECG IS PRESENT Confirmed by Edel Rivero ([id number]) on 04/17/2021 8:55:01 PM         Imaging Studies ordered:  I ordered imaging studies including CXR CTPE I independently visualized and interpreted imaging. I agree with the radiologist interpretation   Medicines ordered and prescription drug management: No orders of the defined types were placed in this encounter.   -I have reviewed the patients home medicines and have made adjustments as needed  Critical interventions none   Cardiac Monitoring: The patient was maintained on a cardiac monitor.  I personally viewed and interpreted the cardiac monitored which showed an underlying rhythm of: NSR  Social Determinants of Health:  Factors impacting patients care include: none   Reevaluation: After the interventions noted above, I  reevaluated the patient and found that they have :improved  Co morbidities that complicate the patient evaluation  Past Medical History:  Diagnosis Date   AMA (advanced maternal age) multigravida 35+    Breast feeding status of mother    GERD (gastroesophageal reflux disease)    Headache    Hx of blood clots    Hx of varicella    Hyperlipemia    Hypertension    takes meds   Pregnancy induced hypertension    Pulmonary embolism (HCC)       Dispostion: I considered admission for this patient, but with negative imaging and reassuring lab work, patient safe for outpatient follow-up.     Final Clinical Impression(s) / ED Diagnoses Final diagnoses:  None     @PCDICTATION @    Glendora Score, MD 04/17/21 2215

## 2021-04-17 NOTE — ED Provider Triage Note (Signed)
Emergency Medicine Provider Triage Evaluation Note  Mindy Wade , a 41 y.o. female  was evaluated in triage.  Pt complains of bilateral chest tightness starting last night.  States that her pain is made worse with deep breathing and going from laying down to standing up.  Patient history of pulmonary embolism in October 2022, is on Xarelto.  Has a history of a prior PE before that.  Had a full hematologic work-up at that time, and there is no found cause for her unprovoked clots.  Review of Systems  Positive: Chest tightness, shortness of breath Negative: Fever, chills, cough  Physical Exam  BP 129/82 (BP Location: Left Arm)    Pulse 91    Temp 98.3 F (36.8 C) (Oral)    Resp 17    SpO2 100%  Gen:   Awake, no distress   Resp:  Normal effort  MSK:   Moves extremities without difficulty  Other:    Medical Decision Making  Medically screening exam initiated at 1:51 PM.  Appropriate orders placed.  Mindy Wade was informed that the remainder of the evaluation will be completed by another provider, this initial triage assessment does not replace that evaluation, and the importance of remaining in the ED until their evaluation is complete.     Tieler Cournoyer T, PA-C 04/17/21 1351

## 2021-04-17 NOTE — ED Triage Notes (Signed)
Patient here with complaint of bilateral chest tightness that started last night, reports history of pulmonary embolism in October 2022, takes xarelto. Patient reports minor shortness of breath, is alert, oriented, and in no apparent distress at this time.

## 2021-04-17 NOTE — ED Notes (Signed)
Patient transported to CT scan . 

## 2021-06-05 ENCOUNTER — Ambulatory Visit: Payer: 59 | Admitting: Orthopedic Surgery

## 2021-08-23 ENCOUNTER — Ambulatory Visit: Payer: 59 | Admitting: Orthopaedic Surgery

## 2021-09-17 ENCOUNTER — Encounter (HOSPITAL_COMMUNITY): Payer: Self-pay

## 2021-09-17 ENCOUNTER — Emergency Department (HOSPITAL_COMMUNITY)
Admission: EM | Admit: 2021-09-17 | Discharge: 2021-09-17 | Disposition: A | Discharge status: Home / Self Care | Payer: 59 | Attending: Emergency Medicine | Admitting: Emergency Medicine

## 2021-09-17 ENCOUNTER — Other Ambulatory Visit: Payer: Self-pay

## 2021-09-17 DIAGNOSIS — X58XXXA Exposure to other specified factors, initial encounter: Secondary | ICD-10-CM | POA: Insufficient documentation

## 2021-09-17 DIAGNOSIS — T2692XA Corrosion of left eye and adnexa, part unspecified, initial encounter: Secondary | ICD-10-CM

## 2021-09-17 DIAGNOSIS — T510X1A Toxic effect of ethanol, accidental (unintentional), initial encounter: Secondary | ICD-10-CM | POA: Insufficient documentation

## 2021-09-17 MED ORDER — TETRACAINE HCL 0.5 % OP SOLN
2.0000 [drp] | Freq: Once | OPHTHALMIC | Status: DC
  Filled 2021-09-17: qty 4

## 2021-09-17 NOTE — ED Provider Notes (Signed)
[facility]  HOSPITAL-EMERGENCY DEPT Provider Note   CSN: [id number] Arrival date & time: 09/17/21  2158     History  Chief Complaint  Patient presents with   Eye Pain    Mindy Wade is a 41 y.o. female.  The history is provided by the patient. No language interpreter was used.  Eye Pain    41 year old female presenting complaining of eye irritation.  Patient accidentally splashed hand sanitizer in her left eye at work tonight.  She immediately rinsed her eye with water for approximately 10 minutes.  She reported did improve symptoms but she does still endorse some irritation about her left eye.  She denies any change in her vision.  She does not wear contact lenses or glasses.  Home Medications Prior to Admission medications   Medication Sig Start Date End Date Taking? Authorizing Provider  acetaminophen (TYLENOL) 500 MG tablet Take 1,000 mg by mouth every 6 (six) hours as needed for moderate pain, mild pain or headache.    [provider]  losartan-hydrochlorothiazide (HYZAAR) 100-25 MG tablet Take 1 tablet by mouth daily. 12/19/20   Joseph Art, DO  Multiple Vitamin (MULTIVITAMIN WITH MINERALS) TABS tablet Take 1 tablet by mouth daily.    [provider]  paragard intrauterine copper IUD IUD by Intrauterine route continuous. Since 01/2019.    [provider]  RIVAROXABAN Carlena Hurl) VTE STARTER PACK (15 & 20 MG) Follow package directions: Take one 15mg  tablet by mouth twice a day. On day 22, switch to one 20mg  tablet once a day. Take with food. 12/17/20   , DO  rosuvastatin (CRESTOR) 10 MG tablet Take 10 mg by mouth at bedtime. 01/18/20   [provider]  enoxaparin (LOVENOX) 150 MG/ML injection Inject 0.73 mLs (110 mg total) into the skin daily. Patient not taking: Reported on 01/14/2019 09/22/18 01/14/19  09/24/18, PA-C  omeprazole (PRILOSEC) 20 MG capsule Take 1 capsule (20 mg total) by mouth  daily. Patient not taking: Reported on 09/22/2018 07/05/18 01/14/19  09/04/18, PA-C      Allergies    Penicillins    Review of Systems   Review of Systems  Eyes:  Positive for pain.    Physical Exam Updated Vital Signs BP 123/74 (BP Location: Left Arm)   Pulse 100   Temp 98.1 F (36.7 C) (Oral)   Resp 17   Ht 5\' 10"  (1.778 m)   Wt 122.5 kg   LMP 09/17/2021 (Exact Date)   SpO2 100%   BMI 38.74 kg/m  Physical Exam Vitals and nursing note reviewed.  Constitutional:      General: She is not in acute distress.    Appearance: She is well-developed.  HENT:     Head: Atraumatic.  Eyes:     General: Lids are normal. Lids are everted, no foreign bodies appreciated. Vision grossly intact. Gaze aligned appropriately.        Right eye: No foreign body or discharge.        Left eye: No foreign body or discharge.     Extraocular Movements: Extraocular movements intact.     Conjunctiva/sclera:     Right eye: Right conjunctiva is not injected. No chemosis, exudate or hemorrhage.    Left eye: Left conjunctiva is injected. No chemosis, exudate or hemorrhage. Pulmonary:     Effort: Pulmonary effort is normal.  Musculoskeletal:     Cervical back: Neck supple.  Skin:    Findings: No rash.  Neurological:  Mental Status: She is alert.  Psychiatric:        Mood and Affect: Mood normal.     ED Results / Procedures / Treatments   Labs (all labs ordered are listed, but only abnormal results are displayed) Labs Reviewed - No data to display  EKG None  Radiology No results found.  Procedures Procedures    Medications Ordered in ED Medications  tetracaine (PONTOCAINE) 0.5 % ophthalmic solution 2 drop (has no administration in time range)    ED Course/ Medical Decision Making/ A&P                           Medical Decision Making Risk Prescription drug management.   BP 123/74 (BP Location: Left Arm)   Pulse 100   Temp 98.1 F (36.7 C) (Oral)   Resp 17   Ht  5\' 10"  (1.778 m)   Wt 122.5 kg   LMP 09/17/2021 (Exact Date)   SpO2 100%   BMI 38.74 kg/m   10:53 PM Patient accidentally splashed hand sanitizer into her left eye while at work earlier today.  She immediately rinsed her eye with water for at least 10 minutes.  She is here for further evaluation.  She endorses mild irritation but states symptom has significantly improved.  She denies any vision changes.  On exam patient is resting comfortably appears to be in no acute discomfort.  No excessive tearing.  Left conjunctiva is mildly injected without any significant changes.  Pupils equal round reactive.  11:05 PM I have anesthetized the patient's left eye using tetracaine drop.  I checked the pH and it shows a pH of 7-7.5 after 3  consecutive check.  This is within normal limit.  At this time patient is stable to be discharged home.  Referral to ophthalmology as needed.  Return precaution given.        Final Clinical Impression(s) / ED Diagnoses Final diagnoses:  Chemical burn of left eye    Rx / DC Orders ED Discharge Orders     None         09/19/2021, PA-C 09/17/21 2308    2309, MD 09/17/21 2312

## 2021-09-17 NOTE — ED Triage Notes (Signed)
Hand sanitizer in left eye tonight at work.   Flushed eye with water for ~10 minutes pta.   Visual acuity  L- 20/20 R- 20/25 Both- 20/15

## 2022-05-26 ENCOUNTER — Encounter (HOSPITAL_COMMUNITY): Payer: Self-pay

## 2022-05-26 ENCOUNTER — Emergency Department (HOSPITAL_COMMUNITY)
Admission: EM | Admit: 2022-05-26 | Discharge: 2022-05-26 | Disposition: A | Discharge status: Home / Self Care | Payer: 59 | Attending: Student | Admitting: Student

## 2022-05-26 ENCOUNTER — Emergency Department (HOSPITAL_COMMUNITY): Payer: 59

## 2022-05-26 ENCOUNTER — Other Ambulatory Visit: Payer: Self-pay

## 2022-05-26 DIAGNOSIS — R0789 Other chest pain: Secondary | ICD-10-CM | POA: Diagnosis not present

## 2022-05-26 DIAGNOSIS — Z7901 Long term (current) use of anticoagulants: Secondary | ICD-10-CM | POA: Insufficient documentation

## 2022-05-26 DIAGNOSIS — R079 Chest pain, unspecified: Secondary | ICD-10-CM | POA: Diagnosis present

## 2022-05-26 LAB — BASIC METABOLIC PANEL
Anion gap: 6 (ref 5–15)
BUN: 15 mg/dL (ref 6–20)
CO2: 27 mmol/L (ref 22–32)
Calcium: 8.4 mg/dL — ABNORMAL LOW (ref 8.9–10.3)
Chloride: 104 mmol/L (ref 98–111)
Creatinine, Ser: 0.67 mg/dL (ref 0.44–1.00)
GFR, Estimated: >60 mL/min (ref >60)
Glucose, Bld: 97 mg/dL (ref 70–99)
Potassium: 3.7 mmol/L (ref 3.5–5.1)
Sodium: 137 mmol/L (ref 135–145)

## 2022-05-26 LAB — CBC
HCT: 36.6 % (ref 36.0–46.0)
Hemoglobin: 11.6 g/dL — ABNORMAL LOW (ref 12.0–15.0)
MCH: 27.9 pg (ref 26.0–34.0)
MCHC: 31.7 g/dL (ref 30.0–36.0)
MCV: 88 fL (ref 80.0–100.0)
Platelets: 337 10*3/uL (ref 150–400)
RBC: 4.16 MIL/uL (ref 3.87–5.11)
RDW: 16.2 % — ABNORMAL HIGH (ref 11.5–15.5)
WBC: 8 10*3/uL (ref 4.0–10.5)
nRBC: 0 % (ref 0.0–0.2)

## 2022-05-26 LAB — TROPONIN I (HIGH SENSITIVITY)
Troponin I (High Sensitivity): 4 ng/L (ref <18)
Troponin I (High Sensitivity): 4 ng/L (ref <18)

## 2022-05-26 LAB — D-DIMER, QUANTITATIVE: D-Dimer, Quant: <0.27 ug/mL-FEU (ref 0.00–0.50)

## 2022-05-26 LAB — I-STAT BETA HCG BLOOD, ED (MC, WL, AP ONLY): I-stat hCG, quantitative: <5 m[IU]/mL (ref <5)

## 2022-05-26 NOTE — ED Provider Notes (Signed)
[place] EMERGENCY DEPARTMENT AT [facility] Provider Note   CSN: ID:[id number] Arrival date & time: 05/26/22  1811     History {Add pertinent medical, surgical, social history, OB history to HPI:1} Chief Complaint  Patient presents with   Chest Pain    Mindy Wade is a 42 y.o. female with a past medical history of previous PE 1 provoked, 1 unprovoked on chronic anticoagulation at 20 mg however recently changed to 10 mg daily.  Patient reports she had sudden onset of sharp left-sided chest pain worse when she moves her arm better with rest.  She denies any shortness of breath, hemoptysis, chest pressure, nausea, diaphoresis.  Pain is improved since she has gotten here but still somewhat present.  She denies any recent heavy lifting or reasons that she may have strained her chest wall.  Patient has a family history concerning for early MI in her mother who had her first MI in her 69s.  Patient has a past medical history personally of high blood pressure and type pretension which are both well-controlled with medications.  She does not smoke and denies a history of diabetes.  The history is provided by the patient.  Chest Pain      Home Medications Prior to Admission medications   Medication Sig Start Date End Date Taking? Authorizing Provider  acetaminophen (TYLENOL) 500 MG tablet Take 1,000 mg by mouth every 6 (six) hours as needed for moderate pain, mild pain or headache.    [provider]  losartan-hydrochlorothiazide (HYZAAR) 100-25 MG tablet Take 1 tablet by mouth daily. 12/19/20   Geradine Girt, DO  Multiple Vitamin (MULTIVITAMIN WITH MINERALS) TABS tablet Take 1 tablet by mouth daily.    [provider]  paragard intrauterine copper IUD IUD by Intrauterine route continuous. Since 01/2019.    [provider]  RIVAROXABAN Alveda Reasons) VTE STARTER PACK (15 & 20 MG) Follow package directions: Take one 15mg  tablet by mouth twice a day. On  day 22, switch to one 20mg  tablet once a day. Take with food. 12/17/20   Geradine Girt, DO  rosuvastatin (CRESTOR) 10 MG tablet Take 10 mg by mouth at bedtime. 01/18/20   [provider]  enoxaparin (LOVENOX) 150 MG/ML injection Inject 0.73 mLs (110 mg total) into the skin daily. Patient not taking: Reported on 01/14/2019 09/22/18 01/14/19  Antonietta Breach, PA-C  omeprazole (PRILOSEC) 20 MG capsule Take 1 capsule (20 mg total) by mouth daily. Patient not taking: Reported on 09/22/2018 07/05/18 01/14/19  Carlisle Cater, PA-C      Allergies    Penicillins    Review of Systems   Review of Systems  Cardiovascular:  Positive for chest pain.    Physical Exam Updated Vital Signs BP 120/77 (BP Location: Right Arm)   Pulse 88   Temp 98.3 F (36.8 C) (Oral)   Resp 16   Ht 5\' 10"  (1.778 m)   Wt 123.8 kg   LMP 05/11/2022   SpO2 98%   BMI 39.17 kg/m  Physical Exam  ED Results / Procedures / Treatments   Labs (all labs ordered are listed, but only abnormal results are displayed) Labs Reviewed  BASIC METABOLIC PANEL - Abnormal; Notable for the following components:      Result Value   Calcium 8.4 (*)    All other components within normal limits  CBC - Abnormal; Notable for the following components:   Hemoglobin 11.6 (*)    RDW 16.2 (*)  All other components within normal limits  I-STAT BETA HCG BLOOD, ED ([facility], [facility], [facility] ONLY)  TROPONIN I (HIGH SENSITIVITY)  TROPONIN I (HIGH SENSITIVITY)    EKG None  Radiology DG Chest 2 View  Result Date: 05/26/2022 CLINICAL DATA:  Chest pain EXAM: CHEST - 2 VIEW COMPARISON:  04/17/2021 FINDINGS: The heart size and mediastinal contours are within normal limits. Both lungs are clear. The visualized skeletal structures are unremarkable. IMPRESSION: No active cardiopulmonary disease. Electronically Signed   By: Nelson Chimes M.D.   On: 05/26/2022 19:09    Procedures Procedures  {Document cardiac monitor, telemetry assessment procedure when  appropriate:1}  Medications Ordered in ED Medications - No data to display  ED Course/ Medical Decision Making/ A&P   {   Click here for ABCD2, HEART and other calculatorsREFRESH Note before signing :1}                          Medical Decision Making Given the large differential diagnosis for Mindy Wade, the decision making in this case is of high complexity.  After evaluating all of the data points in this case, the presentation of Mindy Wade is NOT consistent with Acute Coronary Syndrome (ACS) and/or myocardial ischemia, pulmonary embolism, aortic dissection; Borhaave's, significant arrythmia, pneumothorax, cardiac tamponade, or other emergent cardiopulmonary condition.  Further, the presentation of Mindy Wade is NOT consistent with pericarditis, myocarditis, cholecystitis, pancreatitis, mediastinitis, endocarditis, new valvular disease.  Additionally, the presentation of Mindy Wade NOT consistent with flail chest, cardiac contusion, ARDS, or significant intra-thoracic or intra-abdominal bleeding.  Moreover, this presentation is NOT consistent with pneumonia, sepsis, or pyelonephritis.  The patient has a HEART Score: 2 ***   Strict return and follow-up precautions have been given by me personally or by detailed written instruction given verbally by nursing staff using the teach back method to the patient/family/caregiver(s).  Data Reviewed/Counseling: I have reviewed the patient's vital signs, nursing notes, and other relevant tests/information. I had a detailed discussion regarding the historical points, exam findings, and any diagnostic results supporting the discharge diagnosis. I also discussed the need for outpatient follow-up and the need to return to the ED if symptoms worsen or if there are any questions or concerns that arise at home.    Amount and/or Complexity of Data Reviewed Labs: ordered. Radiology:  ordered.   ***  {Document critical care time when appropriate:1} {Document review of labs and clinical decision tools ie heart score, Chads2Vasc2 etc:1}  {Document your independent review of radiology images, and any outside records:1} {Document your discussion with family members, caretakers, and with consultants:1} {Document social determinants of health affecting pt's care:1} {Document your decision making why or why not admission, treatments were needed:1} Final Clinical Impression(s) / ED Diagnoses Final diagnoses:  None    Rx / DC Orders ED Discharge Orders     None

## 2022-05-26 NOTE — Discharge Instructions (Signed)
You have been diagnosed by your caregiver as having chest wall pain. °SEEK IMMEDIATE MEDICAL ATTENTION IF: °You develop a fever.  °Your chest pains become severe or intolerable.  °You develop new, unexplained symptoms (problems).  °You develop shortness of breath, nausea, vomiting, sweating or feel light headed.  °You develop a new cough or you cough up blood. ° °

## 2022-05-26 NOTE — ED Triage Notes (Signed)
Pt c/o left sided chest pain since 1600 today. Pt states pain was sharp, now feels tight, worse with movement.  Denies N/V Endorses cough  Tylenol at 1730

## 2022-05-26 NOTE — ED Provider Triage Note (Signed)
Emergency Medicine Provider Triage Evaluation Note  Mindy Wade , a 42 y.o. female  was evaluated in triage.  Pt complains of left sided chest pain starting around 4pm. Radiates to her left armpit, now describes it as a tightness. Worse with movement. Recently sick about 2 weeks ago with bad cough and wheezing, took a course of antibiotics. Some residual cough.   Review of Systems  Positive: CP Negative: SOB, leg pain or swelling, N/V  Physical Exam  BP 120/77 (BP Location: Right Arm)   Pulse 88   Temp 98.3 F (36.8 C) (Oral)   Resp 16   Ht 5\' 10"  (1.778 m)   Wt 123.8 kg   LMP 05/11/2022   SpO2 98%   BMI 39.17 kg/m  Gen:   Awake, no distress   Resp:  Normal effort  MSK:   Moves extremities without difficulty  Other:    Medical Decision Making  Medically screening exam initiated at 6:44 PM.  Appropriate orders placed.  Mindy Wade was informed that the remainder of the evaluation will be completed by another provider, this initial triage assessment does not replace that evaluation, and the importance of remaining in the ED until their evaluation is complete.  Workup initiated   Kateri Plummer, PA-C 05/26/22 1845

## 2023-08-15 ENCOUNTER — Other Ambulatory Visit: Payer: Self-pay | Admitting: Medical Genetics

## 2023-08-16 ENCOUNTER — Other Ambulatory Visit

## 2023-11-29 ENCOUNTER — Emergency Department (HOSPITAL_COMMUNITY)

## 2023-11-29 ENCOUNTER — Other Ambulatory Visit: Payer: Self-pay

## 2023-11-29 ENCOUNTER — Emergency Department (HOSPITAL_COMMUNITY)
Admission: EM | Admit: 2023-11-29 | Discharge: 2023-11-29 | Disposition: A | Discharge status: Home / Self Care | Attending: Emergency Medicine | Admitting: Emergency Medicine

## 2023-11-29 ENCOUNTER — Encounter (HOSPITAL_COMMUNITY): Payer: Self-pay

## 2023-11-29 DIAGNOSIS — K429 Umbilical hernia without obstruction or gangrene: Secondary | ICD-10-CM | POA: Insufficient documentation

## 2023-11-29 DIAGNOSIS — R1084 Generalized abdominal pain: Secondary | ICD-10-CM | POA: Diagnosis present

## 2023-11-29 DIAGNOSIS — R1010 Upper abdominal pain, unspecified: Secondary | ICD-10-CM

## 2023-11-29 LAB — URINALYSIS, ROUTINE W REFLEX MICROSCOPIC
Bacteria, UA: NONE SEEN
Bilirubin Urine: NEGATIVE
Glucose, UA: NEGATIVE mg/dL
Ketones, ur: NEGATIVE mg/dL
Leukocytes,Ua: NEGATIVE
Nitrite: NEGATIVE
Protein, ur: NEGATIVE mg/dL
Specific Gravity, Urine: 1.028 (ref 1.005–1.030)
pH: 5 (ref 5.0–8.0)

## 2023-11-29 LAB — CBC WITH DIFFERENTIAL/PLATELET
Abs Immature Granulocytes: 0 K/uL (ref 0.00–0.07)
Basophils Absolute: 0 K/uL (ref 0.0–0.1)
Basophils Relative: 1 %
Eosinophils Absolute: 0.1 K/uL (ref 0.0–0.5)
Eosinophils Relative: 2 %
HCT: 37.3 % (ref 36.0–46.0)
Hemoglobin: 12 g/dL (ref 12.0–15.0)
Immature Granulocytes: 0 %
Lymphocytes Relative: 43 %
Lymphs Abs: 1.7 K/uL (ref 0.7–4.0)
MCH: 29.8 pg (ref 26.0–34.0)
MCHC: 32.2 g/dL (ref 30.0–36.0)
MCV: 92.6 fL (ref 80.0–100.0)
Monocytes Absolute: 0.4 K/uL (ref 0.1–1.0)
Monocytes Relative: 9 %
Neutro Abs: 1.8 K/uL (ref 1.7–7.7)
Neutrophils Relative %: 45 %
Platelets: 208 K/uL (ref 150–400)
RBC: 4.03 MIL/uL (ref 3.87–5.11)
RDW: 15 % (ref 11.5–15.5)
WBC: 4 K/uL (ref 4.0–10.5)
nRBC: 0 % (ref 0.0–0.2)

## 2023-11-29 LAB — COMPREHENSIVE METABOLIC PANEL WITH GFR
ALT: 29 U/L (ref 0–44)
AST: 27 U/L (ref 15–41)
Albumin: 4.1 g/dL (ref 3.5–5.0)
Alkaline Phosphatase: 63 U/L (ref 38–126)
Anion gap: 10 (ref 5–15)
BUN: 21 mg/dL — ABNORMAL HIGH (ref 6–20)
CO2: 22 mmol/L (ref 22–32)
Calcium: 8.9 mg/dL (ref 8.9–10.3)
Chloride: 107 mmol/L (ref 98–111)
Creatinine, Ser: 0.56 mg/dL (ref 0.44–1.00)
GFR, Estimated: >60 mL/min (ref >60)
Glucose, Bld: 93 mg/dL (ref 70–99)
Potassium: 3.9 mmol/L (ref 3.5–5.1)
Sodium: 140 mmol/L (ref 135–145)
Total Bilirubin: 0.3 mg/dL (ref 0.0–1.2)
Total Protein: 7.2 g/dL (ref 6.5–8.1)

## 2023-11-29 LAB — POC URINE PREG, ED: Preg Test, Ur: NEGATIVE

## 2023-11-29 LAB — LIPASE, BLOOD: Lipase: 27 U/L (ref 11–51)

## 2023-11-29 MED ORDER — HYDROCODONE-ACETAMINOPHEN 5-325 MG PO TABS: ORAL_TABLET | ORAL | 0 refills | Status: AC

## 2023-11-29 MED ORDER — IOHEXOL 300 MG/ML  SOLN
100.0000 mL | Freq: Once | INTRAMUSCULAR | Status: AC | PRN
  Administered 2023-11-29: 100 mL via INTRAVENOUS

## 2023-11-29 NOTE — ED Provider Notes (Signed)
 F/u CT. Anticipat d/c if no acute Physical Exam  BP 122/87 (BP Location: Left Arm)   Pulse 72   Temp 98.6 F (37 C) (Oral)   Resp 16   Ht 5' 7 (1.702 m)   Wt 73 kg   SpO2 98%   BMI 25.22 kg/m   Physical Exam Abdominal:     General: Abdominal bruit: F.     Procedures  Procedures  ED Course / MDM    Medical Decision Making Amount and/or Complexity of Data Reviewed Labs: ordered. Radiology: ordered.  Risk Prescription drug management.   CT personally reviewed by myself.  Radiology interpretations for teeny fat-containing umbilical hernia.  Some constipation present.  I reviewed these findings with the patient.  Abdomen is soft.  There is a small nodule of fat containing hernia or scar tissue supraumbilically.  This is not significantly tender and there is no erythema.  I discussed the possibility of constipation with the patient.  She reports she does feel like she is constipated and gets constipated.  We discussed very judicious use of the Vicodin if needed for significant pain otherwise patient will not use Vicodin and take Colace and MiraLAX  for constipation.  Patient has surgical follow-up scheduled.  Charged in good condition.       Armenta Canning, MD 11/29/23 [phone]

## 2023-11-29 NOTE — ED Provider Notes (Signed)
 [place] EMERGENCY DEPARTMENT AT [facility] Provider Note   CSN: [id number] Arrival date & time: 11/29/23  1243     Patient presents with: Abdominal Pain   Mindy Wade is a 43 y.o. female.   Patient was recently diagnosed with a ventral hernia.  Her doctor was going to get an ultrasound on her but her pain has gotten slightly worse and she came here for evaluation  The history is provided by the patient and medical records. No language interpreter was used.  Abdominal Pain Pain location:  Generalized Pain quality: aching   Pain radiates to:  Does not radiate Pain severity:  Mild Onset quality:  Sudden Timing:  Intermittent Chronicity:  New Context: not alcohol use   Relieved by:  Nothing Associated symptoms: no chest pain, no cough, no diarrhea, no fatigue and no hematuria        Prior to Admission medications   Medication Sig Start Date End Date Taking? Authorizing Provider  acetaminophen  (TYLENOL ) 500 MG tablet Take 1,000 mg by mouth every 6 (six) hours as needed for moderate pain, mild pain or headache.    [provider]  losartan -hydrochlorothiazide  (HYZAAR) 100-25 MG tablet Take 1 tablet by mouth daily. 12/19/20   Vann, Jessica U, DO  Multiple Vitamin (MULTIVITAMIN WITH MINERALS) TABS tablet Take 1 tablet by mouth daily.    [provider]  paragard intrauterine copper IUD IUD by Intrauterine route continuous. Since 01/2019.    [provider]  RIVAROXABAN  (XARELTO ) VTE STARTER PACK (15 & 20 MG) Follow package directions: Take one 15mg  tablet by mouth twice a day. On day 22, switch to one 20mg  tablet once a day. Take with food. 12/17/20   Vann, Jessica U, DO  rosuvastatin  (CRESTOR ) 10 MG tablet Take 10 mg by mouth at bedtime. 01/18/20   [provider]  enoxaparin  (LOVENOX ) 150 MG/ML injection Inject 0.73 mLs (110 mg total) into the skin daily. Patient not taking: Reported on 01/14/2019 09/22/18 01/14/19  Keith Sor, PA-C  omeprazole  (PRILOSEC) 20 MG capsule Take 1 capsule (20 mg total) by mouth daily. Patient not taking: Reported on 09/22/2018 07/05/18 01/14/19  Desiderio Chew, PA-C    Allergies: Penicillins    Review of Systems  Constitutional:  Negative for appetite change and fatigue.  HENT:  Negative for congestion, ear discharge and sinus pressure.   Eyes:  Negative for discharge.  Respiratory:  Negative for cough.   Cardiovascular:  Negative for chest pain.  Gastrointestinal:  Positive for abdominal pain. Negative for diarrhea.  Genitourinary:  Negative for frequency and hematuria.  Musculoskeletal:  Negative for back pain.  Skin:  Negative for rash.  Neurological:  Negative for seizures and headaches.  Psychiatric/Behavioral:  Negative for hallucinations.     Updated Vital Signs BP 122/87 (BP Location: Left Arm)   Pulse 72   Temp 98.6 F (37 C) (Oral)   Resp 16   Ht 5' 7 (1.702 m)   Wt 73 kg   SpO2 98%   BMI 25.22 kg/m   Physical Exam Vitals and nursing note reviewed.  Constitutional:      Appearance: She is well-developed.  HENT:     Head: Normocephalic.     Nose: Nose normal.  Eyes:     General: No scleral icterus.    Conjunctiva/sclera: Conjunctivae normal.  Neck:     Thyroid : No thyromegaly.  Cardiovascular:     Rate and Rhythm: Normal rate and regular rhythm.     Heart  sounds: No murmur heard.    No friction rub. No gallop.  Pulmonary:     Breath sounds: No stridor. No wheezing or rales.  Chest:     Chest wall: No tenderness.  Abdominal:     General: There is no distension.     Tenderness: There is abdominal tenderness. There is no rebound.  Musculoskeletal:        General: Normal range of motion.     Cervical back: Neck supple.  Lymphadenopathy:     Cervical: No cervical adenopathy.  Skin:    Findings: No erythema or rash.  Neurological:     Mental Status: She is alert and oriented to person, place, and time.     Motor: No abnormal muscle tone.      Coordination: Coordination normal.  Psychiatric:        Behavior: Behavior normal.     (all labs ordered are listed, but only abnormal results are displayed) Labs Reviewed  COMPREHENSIVE METABOLIC PANEL WITH GFR - Abnormal; Notable for the following components:      Result Value   BUN 21 (*)    All other components within normal limits  URINALYSIS, ROUTINE W REFLEX MICROSCOPIC - Abnormal; Notable for the following components:   Hgb urine dipstick MODERATE (*)    All other components within normal limits  CBC WITH DIFFERENTIAL/PLATELET  LIPASE, BLOOD  POC URINE PREG, ED    EKG: None  Radiology: No results found.   Procedures   Medications Ordered in the ED - No data to display                                  Medical Decision Making Amount and/or Complexity of Data Reviewed Labs: ordered. Radiology: ordered.  Risk Prescription drug management.   Abdominal pain, most likely ventral hernia causing pain.  CT scan pending     Final diagnoses:  None    ED Discharge Orders     None          Suzette Pac, MD 12/02/23 1614

## 2023-11-29 NOTE — Discharge Instructions (Addendum)
 Follow-up with a general surgeon in the next few weeks about your ventral abdominal hernia.  Return if any problems  You have a very small, fat-containing umbilical hernia.  If this is painful or causing problems it may need repair.  Follow-up with surgery as planned.  CT shows some possible constipation.  If you are in general abdominal pain take a stool softener twice a day such as Colace to take MiraLAX  daily until you are having a soft stool.

## 2023-11-29 NOTE — ED Triage Notes (Signed)
 Pt noticed lump in August, saw PCP yesterday and they think she has a hernia, pain started around noon yesterday, decreased appetite, nausea, no vomiting. No diarrhea.

## 2023-12-17 ENCOUNTER — Other Ambulatory Visit: Payer: Self-pay | Admitting: Medical Genetics

## 2023-12-17 DIAGNOSIS — Z006 Encounter for examination for normal comparison and control in clinical research program: Secondary | ICD-10-CM

## 2024-02-20 ENCOUNTER — Other Ambulatory Visit: Payer: Self-pay

## 2024-02-20 ENCOUNTER — Emergency Department (HOSPITAL_COMMUNITY)

## 2024-02-20 ENCOUNTER — Emergency Department (HOSPITAL_COMMUNITY)
Admission: EM | Admit: 2024-02-20 | Discharge: 2024-02-20 | Disposition: A | Discharge status: Home / Self Care | Attending: Emergency Medicine | Admitting: Emergency Medicine

## 2024-02-20 DIAGNOSIS — Z79899 Other long term (current) drug therapy: Secondary | ICD-10-CM | POA: Insufficient documentation

## 2024-02-20 DIAGNOSIS — R519 Headache, unspecified: Secondary | ICD-10-CM | POA: Insufficient documentation

## 2024-02-20 DIAGNOSIS — R0602 Shortness of breath: Secondary | ICD-10-CM | POA: Diagnosis not present

## 2024-02-20 DIAGNOSIS — I10 Essential (primary) hypertension: Secondary | ICD-10-CM | POA: Diagnosis not present

## 2024-02-20 DIAGNOSIS — Z7901 Long term (current) use of anticoagulants: Secondary | ICD-10-CM | POA: Insufficient documentation

## 2024-02-20 LAB — HCG, SERUM, QUALITATIVE: Preg, Serum: NEGATIVE

## 2024-02-20 LAB — BASIC METABOLIC PANEL WITH GFR
Anion gap: 8 (ref 5–15)
BUN: 15 mg/dL (ref 6–20)
CO2: 25 mmol/L (ref 22–32)
Calcium: 9 mg/dL (ref 8.9–10.3)
Chloride: 106 mmol/L (ref 98–111)
Creatinine, Ser: 0.56 mg/dL (ref 0.44–1.00)
GFR, Estimated: >60 mL/min
Glucose, Bld: 103 mg/dL — ABNORMAL HIGH (ref 70–99)
Potassium: 3.9 mmol/L (ref 3.5–5.1)
Sodium: 139 mmol/L (ref 135–145)

## 2024-02-20 LAB — CBC
HCT: 35.3 % — ABNORMAL LOW (ref 36.0–46.0)
Hemoglobin: 11.7 g/dL — ABNORMAL LOW (ref 12.0–15.0)
MCH: 31.3 pg (ref 26.0–34.0)
MCHC: 33.1 g/dL (ref 30.0–36.0)
MCV: 94.4 fL (ref 80.0–100.0)
Platelets: 279 K/uL (ref 150–400)
RBC: 3.74 MIL/uL — ABNORMAL LOW (ref 3.87–5.11)
RDW: 15 % (ref 11.5–15.5)
WBC: 4 K/uL (ref 4.0–10.5)
nRBC: 0 % (ref 0.0–0.2)

## 2024-02-20 LAB — RESP PANEL BY RT-PCR (RSV, FLU A&B, COVID)  RVPGX2
Influenza A by PCR: NEGATIVE
Influenza B by PCR: NEGATIVE
Resp Syncytial Virus by PCR: NEGATIVE
SARS Coronavirus 2 by RT PCR: NEGATIVE

## 2024-02-20 MED ORDER — DIPHENHYDRAMINE HCL 50 MG/ML IJ SOLN
25.0000 mg | Freq: Once | INTRAMUSCULAR | Status: AC
  Administered 2024-02-20: 25 mg via INTRAVENOUS
  Filled 2024-02-20: qty 1

## 2024-02-20 MED ORDER — ACETAMINOPHEN 500 MG PO TABS
1000.0000 mg | ORAL_TABLET | Freq: Once | ORAL | Status: AC
  Administered 2024-02-20: 1000 mg via ORAL
  Filled 2024-02-20: qty 2

## 2024-02-20 MED ORDER — PROCHLORPERAZINE EDISYLATE 10 MG/2ML IJ SOLN
10.0000 mg | Freq: Once | INTRAMUSCULAR | Status: DC
  Filled 2024-02-20: qty 2

## 2024-02-20 MED ORDER — METOCLOPRAMIDE HCL 5 MG/ML IJ SOLN: 10.0000 mg | Freq: Once | INTRAMUSCULAR | Status: DC

## 2024-02-20 MED ORDER — IOHEXOL 350 MG/ML SOLN
75.0000 mL | Freq: Once | INTRAVENOUS | Status: AC | PRN
  Administered 2024-02-20: 75 mL via INTRAVENOUS

## 2024-02-20 NOTE — ED Provider Notes (Signed)
 " [place] EMERGENCY DEPARTMENT AT [facility] Provider Note   CSN: [id number] Arrival date & time: 02/20/24  9173     History {Add pertinent medical, surgical, social history, OB history to HPI:1} Chief Complaint  Patient presents with   Headache   Shortness of Breath    Mindy Wade is a 43 y.o. female with PMH as listed below who presents with HA across forehead x 10 pm last night took hydrocodone  with tylenol  with minimal relief, pt reports HA eases up but returns. Never had a headache like this before. Worst headache of her life.   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 No missed doses. Pt reports recent surgery x 2 weeks ago, ventral hernia repair and skin removal, endorses she just had her f/u and was told everything good.  Denies abd pain, N/V, visual changes, N/T, asymmetric weakness. Rates HA currently 7/10, located above her R eye. Not a headache person. No sore throat/cough/flu-like sxs. No redness/fluctuance, purulent drainage from abd surgical incisions. No wood burning stove, no fireplace. No chest pain. Did feel mildly short of breat this AM, didn't feel like prior PEs. Has since gone away.    Past Medical History:  Diagnosis Date   AMA (advanced maternal age) multigravida 35+    Breast feeding status of mother    GERD (gastroesophageal reflux disease)    Headache    Hx of blood clots    Hx of varicella    Hyperlipemia    Hypertension    takes meds   Pregnancy induced hypertension    Pulmonary embolism (HCC)        Home Medications Prior to Admission medications  Medication Sig Start Date End Date Taking? Authorizing Provider  acetaminophen  (TYLENOL ) 500 MG tablet Take 1,000 mg by mouth every 6 (six) hours as needed for moderate pain, mild pain or headache.    [provider]  HYDROcodone -acetaminophen  (NORCO/VICODIN) 5-325 MG tablet Take 1 every 6-8 hours for pain not  relieved by Tylenol  alone. 11/29/23   Suzette Pac, MD  losartan -hydrochlorothiazide  (HYZAAR) 100-25 MG tablet Take 1 tablet by mouth daily. 12/19/20   Vann, Jessica U, DO  Multiple Vitamin (MULTIVITAMIN WITH MINERALS) TABS tablet Take 1 tablet by mouth daily.    [provider]  paragard intrauterine copper IUD IUD by Intrauterine route continuous. Since 01/2019.    [provider]  RIVAROXABAN  (XARELTO ) VTE STARTER PACK (15 & 20 MG) Follow package directions: Take one 15mg  tablet by mouth twice a day. On day 22, switch to one 20mg  tablet once a day. Take with food. 12/17/20   Vann, Jessica U, DO  rosuvastatin  (CRESTOR ) 10 MG tablet Take 10 mg by mouth at bedtime. 01/18/20   [provider]  enoxaparin  (LOVENOX ) 150 MG/ML injection Inject 0.73 mLs (110 mg total) into the skin daily. Patient not taking: Reported on 01/14/2019 09/22/18 01/14/19  Keith Sor, PA-C  omeprazole  (PRILOSEC) 20 MG capsule Take 1 capsule (20 mg total) by mouth daily. Patient not taking: Reported on 09/22/2018 07/05/18 01/14/19  Desiderio Chew, PA-C      Allergies    Penicillins    Review of Systems   Review of Systems A 10 point review of systems was performed and is negative unless otherwise reported in HPI.  Physical Exam Updated Vital Signs BP (!) 137/101 (BP Location: Left Arm)   Pulse 73   Temp 98.1 F (36.7 C) (Oral)   Resp 17  Ht 5' 7 (1.702 m)   Wt 77.1 kg   LMP 02/20/2024   SpO2 96%   BMI 26.63 kg/m  Physical Exam General: Normal appearing female, lying in bed.  HEENT: PERRLA, Sclera anicteric, MMM, trachea midline.  Cardiology: RRR, no murmurs/rubs/gallops. BL radial and DP pulses equal bilaterally.  Resp: Normal respiratory rate and effort. CTAB, no wheezes, rhonchi, crackles.  Abd: Soft, non-tender, non-distended. No rebound tenderness or guarding. Well healing large abdominal surgical incisions from recent hernia repair/skin removal. No induration, fluctuance,  purulent drainage. Minimal TTP.  GU: Deferred. MSK: No peripheral edema or signs of trauma. Extremities without deformity or TTP. No cyanosis or clubbing. Skin: warm, dry. No rashes or lesions. Back: No CVA tenderness Neuro: A&Ox4, CNs II-XII grossly intact. MAEs. Sensation grossly intact.  Psych: Normal mood and affect.   ED Results / Procedures / Treatments   Labs (all labs ordered are listed, but only abnormal results are displayed) Labs Reviewed  RESP PANEL BY RT-PCR (RSV, FLU A&B, COVID)  [id number]  BASIC METABOLIC PANEL WITH GFR  CBC  HCG, SERUM, QUALITATIVE    EKG None  Radiology No results found.  Procedures Procedures  {Document cardiac monitor, telemetry assessment procedure when appropriate:1}  Medications Ordered in ED Medications  acetaminophen  (TYLENOL ) tablet 1,000 mg (has no administration in time range)  metoCLOPramide  (REGLAN ) injection 10 mg (has no administration in time range)  diphenhydrAMINE  (BENADRYL ) injection 25 mg (has no administration in time range)    ED Course/ Medical Decision Making/ A&P                          Medical Decision Making Amount and/or Complexity of Data Reviewed Labs: ordered. Decision-making details documented in ED Course. Radiology: ordered. Decision-making details documented in ED Course.  Risk OTC drugs. Prescription drug management.    This patient presents to the ED for concern of ***, this involves an extensive number of treatment options, and is a complaint that carries with it a high risk of complications and morbidity.  I considered the following differential and admission for this acute, potentially life threatening condition.   MDM:     Clinical Course as of 02/22/24 2350  Thu Feb 20, 2024  1006 DG Chest 2 View No acute findings. [HN]  1006 WBC: 4.0 No leukocytosis [HN]  1006 Basic metabolic panel(!) wnl [HN]  1025 Resp panel by RT-PCR (RSV, Flu A&B, Covid) Anterior Nasal Swab neg [HN]  1025  Preg, Serum: NEGATIVE [HN]  1238 CT Angio Head W or Wo Contrast 1. Negative intracranial CTA. 2. No acute intracranial abnormality.   [HN]  1311 Patient decided she didn't want the compazine . Given tylenol  and benadryl  with improvement in her headache, now rated 3/10, states she is ready to be discharged. Labs/imaging including CTA reassuring. I discussed w/ the patient that we did not perform a CTV today, and given her history of blood clots, this is a risk for venous sinus thrombosis. She has no neurologic deficits, no visual changes, and headache has improved, overall reassuring, and she takes xarelto  w/ no missed doses. Discussed that if her headache worsens again or doesn't go away she will need to come back to ED. Patient reports understanding. DC w/ discharge instructions/return precautions. All questions answered to patient's satisfaction.   [HN]    Clinical Course User Index [HN] Franklyn Sid SAILOR, MD    Labs: I Ordered, and personally interpreted labs.  The pertinent results include:  those listed above  Imaging Studies ordered: I ordered imaging studies including *** I independently visualized and interpreted imaging. I agree with the radiologist interpretation  Additional history obtained from ***.  External records from outside source obtained and reviewed including ***  Cardiac Monitoring: The patient was maintained on a cardiac monitor.  I personally viewed and interpreted the cardiac monitored which showed an underlying rhythm of: ***  Reevaluation: After the interventions noted above, I reevaluated the patient and found that they have :{resolved/improved/worsened:23923::improved}  Social Determinants of Health: ***  Disposition:  ***  Co morbidities that complicate the patient evaluation  Past Medical History:  Diagnosis Date   AMA (advanced maternal age) multigravida 35+    Breast feeding status of mother    GERD (gastroesophageal reflux disease)     Headache    Hx of blood clots    Hx of varicella    Hyperlipemia    Hypertension    takes meds   Pregnancy induced hypertension    Pulmonary embolism (HCC)      Medicines No orders of the defined types were placed in this encounter.   I have reviewed the patients home medicines and have made adjustments as needed  Problem List / ED Course: Problem List Items Addressed This Visit   None        {Document critical care time when appropriate:1} {Document review of labs and clinical decision tools ie heart score, Chads2Vasc2 etc:1}  {Document your independent review of radiology images, and any outside records:1} {Document your discussion with family members, caretakers, and with consultants:1} {Document social determinants of health affecting pt's care:1} {Document your decision making why or why not admission, treatments were needed:1}  This note was created using dictation software, which may contain spelling or grammatical errors.  "

## 2024-02-20 NOTE — Discharge Instructions (Signed)
 Thank you for coming to [facility] Emergency Department. You were seen for headache. We did an exam, labs, and imaging, and these showed no acute findings. If your headache returns or doesn't improve, please return to the ED. If you develop any stroke-like symptoms, visual changes, or facial droop, please return to the ED.   Please follow up with your primary care provider within 1 week.   Do not hesitate to return to the ED or call 911 if you experience: -Worsening symptoms -Lightheadedness, passing out -Fevers/chills -Anything else that concerns you

## 2024-02-20 NOTE — ED Triage Notes (Signed)
 HA across forehead x 10 pm last night took hydrocodone  with tylenol  with minimal relief, pt reports HA eases up but returns.  SOBx 1 hour PTA, pt taking in full sentences, RR even and unlabored during triage.   Pt reports recent surgery x 2 weeks ago, hernia repair and skin removal , endorses she just had her f/u and was told everything good.

## 2024-03-12 ENCOUNTER — Ambulatory Visit (INDEPENDENT_AMBULATORY_CARE_PROVIDER_SITE_OTHER): Payer: Self-pay | Admitting: Podiatry

## 2024-03-12 DIAGNOSIS — Z91199 Patient's noncompliance with other medical treatment and regimen due to unspecified reason: Secondary | ICD-10-CM

## 2024-03-16 NOTE — Progress Notes (Signed)
Patient did not show for scheduled appointment today.
